# Patient Record
Sex: Male | Born: 1945 | Race: White | Hispanic: No | Marital: Married | Smoking: Never smoker
Health system: Southern US, Community
[De-identification: ages and names within clinical notes are randomized; demographics above are authoritative.]

## PROBLEM LIST (undated history)

## (undated) DIAGNOSIS — G8929 Other chronic pain: Secondary | ICD-10-CM

## (undated) DIAGNOSIS — F1010 Alcohol abuse, uncomplicated: Secondary | ICD-10-CM

## (undated) DIAGNOSIS — F329 Major depressive disorder, single episode, unspecified: Secondary | ICD-10-CM

## (undated) DIAGNOSIS — N183 Chronic kidney disease, stage 3 unspecified: Secondary | ICD-10-CM

## (undated) DIAGNOSIS — I5032 Chronic diastolic (congestive) heart failure: Secondary | ICD-10-CM

## (undated) DIAGNOSIS — E669 Obesity, unspecified: Secondary | ICD-10-CM

## (undated) DIAGNOSIS — I2510 Atherosclerotic heart disease of native coronary artery without angina pectoris: Secondary | ICD-10-CM

## (undated) DIAGNOSIS — L97509 Non-pressure chronic ulcer of other part of unspecified foot with unspecified severity: Secondary | ICD-10-CM

## (undated) DIAGNOSIS — E785 Hyperlipidemia, unspecified: Secondary | ICD-10-CM

## (undated) DIAGNOSIS — G629 Polyneuropathy, unspecified: Secondary | ICD-10-CM

## (undated) DIAGNOSIS — I10 Essential (primary) hypertension: Secondary | ICD-10-CM

## (undated) DIAGNOSIS — I4510 Unspecified right bundle-branch block: Secondary | ICD-10-CM

## (undated) DIAGNOSIS — K219 Gastro-esophageal reflux disease without esophagitis: Secondary | ICD-10-CM

## (undated) DIAGNOSIS — G4733 Obstructive sleep apnea (adult) (pediatric): Secondary | ICD-10-CM

## (undated) DIAGNOSIS — M1990 Unspecified osteoarthritis, unspecified site: Secondary | ICD-10-CM

## (undated) DIAGNOSIS — J45909 Unspecified asthma, uncomplicated: Secondary | ICD-10-CM

## (undated) HISTORY — DX: Unspecified right bundle-branch block: I45.10

## (undated) HISTORY — DX: Major depressive disorder, single episode, unspecified: F32.9

## (undated) HISTORY — DX: Other chronic pain: G89.29

## (undated) HISTORY — DX: Chronic diastolic (congestive) heart failure: I50.32

## (undated) HISTORY — DX: Unspecified osteoarthritis, unspecified site: M19.90

## (undated) HISTORY — DX: Obesity, unspecified: E66.9

## (undated) HISTORY — PX: CORONARY STENT PLACEMENT: SHX1402

## (undated) HISTORY — DX: Chronic kidney disease, stage 3 (moderate): N18.3

## (undated) HISTORY — DX: Alcohol abuse, uncomplicated: F10.10

## (undated) HISTORY — DX: Chronic kidney disease, stage 3 unspecified: N18.30

## (undated) HISTORY — DX: Gastro-esophageal reflux disease without esophagitis: K21.9

## (undated) HISTORY — DX: Hyperlipidemia, unspecified: E78.5

## (undated) HISTORY — DX: Polyneuropathy, unspecified: G62.9

## (undated) HISTORY — DX: Obstructive sleep apnea (adult) (pediatric): G47.33

## (undated) HISTORY — PX: REPLACEMENT TOTAL KNEE: SUR1224

## (undated) HISTORY — DX: Essential (primary) hypertension: I10

## (undated) HISTORY — DX: Non-pressure chronic ulcer of other part of unspecified foot with unspecified severity: L97.509

## (undated) HISTORY — DX: Unspecified asthma, uncomplicated: J45.909

---

## 2001-11-15 ENCOUNTER — Encounter: Payer: Self-pay | Admitting: Orthopedic Surgery

## 2001-11-21 ENCOUNTER — Encounter (INDEPENDENT_AMBULATORY_CARE_PROVIDER_SITE_OTHER): Payer: Self-pay | Admitting: Specialist

## 2001-11-21 ENCOUNTER — Ambulatory Visit (HOSPITAL_COMMUNITY): Admission: RE | Admit: 2001-11-21 | Discharge: 2001-11-21 | Payer: Self-pay | Admitting: Orthopedic Surgery

## 2004-05-02 ENCOUNTER — Encounter: Admission: RE | Admit: 2004-05-02 | Discharge: 2004-05-02 | Payer: Self-pay | Admitting: Orthopedic Surgery

## 2004-11-07 ENCOUNTER — Encounter: Admission: RE | Admit: 2004-11-07 | Discharge: 2004-11-07 | Payer: Self-pay | Admitting: Neurology

## 2005-05-11 ENCOUNTER — Encounter: Admission: RE | Admit: 2005-05-11 | Discharge: 2005-05-11 | Payer: Self-pay | Admitting: Neurology

## 2008-12-25 ENCOUNTER — Emergency Department (HOSPITAL_COMMUNITY): Admission: EM | Admit: 2008-12-25 | Discharge: 2008-12-25 | Payer: Self-pay | Admitting: Emergency Medicine

## 2009-04-12 ENCOUNTER — Inpatient Hospital Stay (HOSPITAL_COMMUNITY): Admission: EM | Admit: 2009-04-12 | Discharge: 2009-04-16 | Payer: Self-pay | Admitting: Cardiology

## 2011-01-27 ENCOUNTER — Other Ambulatory Visit (HOSPITAL_COMMUNITY): Payer: Medicare Other

## 2011-02-05 ENCOUNTER — Inpatient Hospital Stay (HOSPITAL_COMMUNITY): Admission: RE | Admit: 2011-02-05 | Payer: Medicare Other | Source: Ambulatory Visit | Admitting: Orthopedic Surgery

## 2011-03-10 LAB — BASIC METABOLIC PANEL
BUN: 9 mg/dL (ref 6–23)
CO2: 28 mEq/L (ref 19–32)
CO2: 29 mEq/L (ref 19–32)
Chloride: 100 mEq/L (ref 96–112)
Creatinine, Ser: 0.8 mg/dL (ref 0.4–1.5)
GFR calc non Af Amer: >60 mL/min (ref >60)
Glucose, Bld: 106 mg/dL — ABNORMAL HIGH (ref 70–99)
Potassium: 3.3 mEq/L — ABNORMAL LOW (ref 3.5–5.1)

## 2011-03-10 LAB — POCT I-STAT, CHEM 8
Glucose, Bld: 128 mg/dL — ABNORMAL HIGH (ref 70–99)
HCT: 41 % (ref 39.0–52.0)
Hemoglobin: 13.9 g/dL (ref 13.0–17.0)
Potassium: 4 mEq/L (ref 3.5–5.1)
Sodium: 138 mEq/L (ref 135–145)
TCO2: 27 mmol/L (ref 0–100)

## 2011-03-10 LAB — CBC
Hemoglobin: 12.9 g/dL — ABNORMAL LOW (ref 13.0–17.0)
RBC: 4.02 MIL/uL — ABNORMAL LOW (ref 4.22–5.81)
WBC: 7.6 10*3/uL (ref 4.0–10.5)

## 2011-03-10 LAB — CARDIAC PANEL(CRET KIN+CKTOT+MB+TROPI)
CK, MB: 91.5 ng/mL — ABNORMAL HIGH (ref 0.3–4.0)
Relative Index: 6.1 — ABNORMAL HIGH (ref 0.0–2.5)
Relative Index: 14.2 — ABNORMAL HIGH (ref 0.0–2.5)
Total CK: 246 U/L — ABNORMAL HIGH (ref 7–232)
Total CK: 645 U/L — ABNORMAL HIGH (ref 7–232)
Troponin I: 19.01 ng/mL (ref 0.00–0.06)

## 2011-03-10 LAB — POCT I-STAT 3, ART BLOOD GAS (G3+)
TCO2: 29 mmol/L (ref 0–100)
pCO2 arterial: 53.3 mmHg — ABNORMAL HIGH (ref 35.0–45.0)

## 2011-03-10 LAB — LIPID PANEL
Cholesterol: 144 mg/dL (ref 0–200)
HDL: 38 mg/dL — ABNORMAL LOW (ref >39)
Triglycerides: 161 mg/dL — ABNORMAL HIGH (ref <150)
VLDL: 32 mg/dL (ref 0–40)

## 2011-03-16 LAB — DIFFERENTIAL
Eosinophils Relative: 2 % (ref 0–5)
Lymphocytes Relative: 19 % (ref 12–46)
Lymphs Abs: 1.7 10*3/uL (ref 0.7–4.0)
Monocytes Absolute: 0.8 10*3/uL (ref 0.1–1.0)
Monocytes Relative: 9 % (ref 3–12)
Neutrophils Relative %: 70 % (ref 43–77)

## 2011-03-16 LAB — CBC
Platelets: 300 10*3/uL (ref 150–400)
RBC: 4.91 MIL/uL (ref 4.22–5.81)
RDW: 14.2 % (ref 11.5–15.5)

## 2011-03-16 LAB — POCT I-STAT, CHEM 8: Creatinine, Ser: 0.9 mg/dL (ref 0.4–1.5)

## 2011-03-16 LAB — POCT CARDIAC MARKERS
CKMB, poc: 1.2 ng/mL (ref 1.0–8.0)
Myoglobin, poc: 77.8 ng/mL (ref 12–200)

## 2011-03-26 ENCOUNTER — Other Ambulatory Visit: Payer: Self-pay | Admitting: Anesthesiology

## 2011-03-26 ENCOUNTER — Other Ambulatory Visit: Payer: Self-pay | Admitting: Orthopedic Surgery

## 2011-03-26 ENCOUNTER — Encounter (HOSPITAL_COMMUNITY): Payer: Medicare Other | Attending: Orthopedic Surgery

## 2011-03-26 DIAGNOSIS — I10 Essential (primary) hypertension: Secondary | ICD-10-CM | POA: Insufficient documentation

## 2011-03-26 DIAGNOSIS — M1710 Unilateral primary osteoarthritis, unspecified knee: Secondary | ICD-10-CM | POA: Insufficient documentation

## 2011-03-26 DIAGNOSIS — K219 Gastro-esophageal reflux disease without esophagitis: Secondary | ICD-10-CM | POA: Insufficient documentation

## 2011-03-26 DIAGNOSIS — E7800 Pure hypercholesterolemia, unspecified: Secondary | ICD-10-CM | POA: Insufficient documentation

## 2011-03-26 DIAGNOSIS — Z79899 Other long term (current) drug therapy: Secondary | ICD-10-CM | POA: Insufficient documentation

## 2011-03-26 DIAGNOSIS — Z01812 Encounter for preprocedural laboratory examination: Secondary | ICD-10-CM | POA: Insufficient documentation

## 2011-03-26 LAB — BASIC METABOLIC PANEL
BUN: 11 mg/dL (ref 6–23)
Calcium: 8.8 mg/dL (ref 8.4–10.5)
Chloride: 102 mEq/L (ref 96–112)
Creatinine, Ser: 0.67 mg/dL (ref 0.4–1.5)
GFR calc Af Amer: >60 mL/min (ref >60)
GFR calc non Af Amer: >60 mL/min (ref >60)
Glucose, Bld: 91 mg/dL (ref 70–99)
Potassium: 4.2 mEq/L (ref 3.5–5.1)

## 2011-03-26 LAB — SURGICAL PCR SCREEN: Staphylococcus aureus: POSITIVE — AB

## 2011-03-26 LAB — CBC
MCHC: 32.4 g/dL (ref 30.0–36.0)
Platelets: 206 10*3/uL (ref 150–400)
RBC: 3.7 MIL/uL — ABNORMAL LOW (ref 4.22–5.81)
WBC: 5.1 10*3/uL (ref 4.0–10.5)

## 2011-03-26 LAB — PROTIME-INR: INR: 0.99 (ref 0.00–1.49)

## 2011-03-26 LAB — APTT: aPTT: 32 seconds (ref 24–37)

## 2011-04-02 NOTE — H&P (Signed)
  NAME:  Keith Bernard, Keith Bernard NO.:  [id number]  MEDICAL RECORD NO.:  [id number]           PATIENT TYPE:  LOCATION:                                 FACILITY:  PHYSICIAN:  Marlowe Kays, M.D.  DATE OF BIRTH:  [birth year]  DATE OF ADMISSION:  04/03/2011 DATE OF DISCHARGE:                             HISTORY & PHYSICAL   CHIEF COMPLAINT:  "Pain in my left knee."  PRESENT ILLNESS:  This 65 year old white male has been seen by [place] with continuing problems concerning his left knee.  He has failed conservative care including cortisone injection, viscosupplementation. In the distant past, he has successfully undergone right total knee replacement arthroplasty.  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  He has also suffered some weight gain secondary to his level of inactivity.  After much discussion including the risks and benefits of surgery, it was decided to go ahead with total knee replacement arthroplasty of the left knee.  Dr. Simonne Come has discussed with him the  DICTATION ENDED AT THIS POINT.     [name].   ______________________________ Marlowe Kays, M.D.    DLU/MEDQ  D:  03/26/2011  T:  03/26/2011  Job:  [id number]  Electronically Signed by Marlowe Kays M.D. on 04/02/2011 12:38:23 PM

## 2011-04-03 ENCOUNTER — Inpatient Hospital Stay (HOSPITAL_COMMUNITY): Payer: Medicare Other

## 2011-04-03 ENCOUNTER — Inpatient Hospital Stay (HOSPITAL_COMMUNITY)
Admission: RE | Admit: 2011-04-03 | Discharge: 2011-04-08 | DRG: 470 | Disposition: A | Discharge status: Skilled Nursing Facility | Payer: Medicare Other | Source: Ambulatory Visit | Attending: Orthopedic Surgery | Admitting: Orthopedic Surgery

## 2011-04-03 DIAGNOSIS — I10 Essential (primary) hypertension: Secondary | ICD-10-CM | POA: Diagnosis present

## 2011-04-03 DIAGNOSIS — M1710 Unilateral primary osteoarthritis, unspecified knee: Principal | ICD-10-CM | POA: Diagnosis present

## 2011-04-03 DIAGNOSIS — Z96659 Presence of unspecified artificial knee joint: Secondary | ICD-10-CM

## 2011-04-03 DIAGNOSIS — E871 Hypo-osmolality and hyponatremia: Secondary | ICD-10-CM | POA: Diagnosis not present

## 2011-04-03 DIAGNOSIS — F1010 Alcohol abuse, uncomplicated: Secondary | ICD-10-CM | POA: Diagnosis present

## 2011-04-03 DIAGNOSIS — E7800 Pure hypercholesterolemia, unspecified: Secondary | ICD-10-CM | POA: Diagnosis present

## 2011-04-03 DIAGNOSIS — Z531 Procedure and treatment not carried out because of patient's decision for reasons of belief and group pressure: Secondary | ICD-10-CM

## 2011-04-03 DIAGNOSIS — D62 Acute posthemorrhagic anemia: Secondary | ICD-10-CM | POA: Diagnosis not present

## 2011-04-03 DIAGNOSIS — Z79899 Other long term (current) drug therapy: Secondary | ICD-10-CM

## 2011-04-03 DIAGNOSIS — I2510 Atherosclerotic heart disease of native coronary artery without angina pectoris: Secondary | ICD-10-CM | POA: Diagnosis present

## 2011-04-03 DIAGNOSIS — Z01812 Encounter for preprocedural laboratory examination: Secondary | ICD-10-CM

## 2011-04-03 DIAGNOSIS — K219 Gastro-esophageal reflux disease without esophagitis: Secondary | ICD-10-CM | POA: Diagnosis present

## 2011-04-03 DIAGNOSIS — G4733 Obstructive sleep apnea (adult) (pediatric): Secondary | ICD-10-CM | POA: Diagnosis present

## 2011-04-04 LAB — PROTIME-INR: Prothrombin Time: 14.1 seconds (ref 11.6–15.2)

## 2011-04-04 LAB — BASIC METABOLIC PANEL
Calcium: 7.8 mg/dL — ABNORMAL LOW (ref 8.4–10.5)
Chloride: 96 mEq/L (ref 96–112)
GFR calc Af Amer: >60 mL/min (ref >60)
Sodium: 128 mEq/L — ABNORMAL LOW (ref 135–145)

## 2011-04-04 LAB — CBC
HCT: 26.2 % — ABNORMAL LOW (ref 39.0–52.0)
MCV: 100 fL (ref 78.0–100.0)
Platelets: 169 10*3/uL (ref 150–400)
RDW: 12.3 % (ref 11.5–15.5)

## 2011-04-05 LAB — BASIC METABOLIC PANEL
CO2: 26 mEq/L (ref 19–32)
Calcium: 8.4 mg/dL (ref 8.4–10.5)
Chloride: 96 mEq/L (ref 96–112)
GFR calc Af Amer: >60 mL/min (ref >60)
Potassium: 4 mEq/L (ref 3.5–5.1)
Sodium: 128 mEq/L — ABNORMAL LOW (ref 135–145)

## 2011-04-05 LAB — CBC
HCT: 20.9 % — ABNORMAL LOW (ref 39.0–52.0)
Hemoglobin: 7 g/dL — ABNORMAL LOW (ref 13.0–17.0)
MCV: 98.1 fL (ref 78.0–100.0)
RDW: 12.3 % (ref 11.5–15.5)

## 2011-04-06 LAB — PROTIME-INR: Prothrombin Time: 17.4 seconds — ABNORMAL HIGH (ref 11.6–15.2)

## 2011-04-06 LAB — IRON AND TIBC
Iron: 10 ug/dL — ABNORMAL LOW (ref 42–135)
Saturation Ratios: 5 % — ABNORMAL LOW (ref 20–55)
UIBC: 182 ug/dL

## 2011-04-06 LAB — CBC
HCT: 18.1 % — ABNORMAL LOW (ref 39.0–52.0)
Hemoglobin: 6.1 g/dL — CL (ref 13.0–17.0)
MCH: 32.8 pg (ref 26.0–34.0)
RBC: 1.86 MIL/uL — ABNORMAL LOW (ref 4.22–5.81)

## 2011-04-06 LAB — FERRITIN: Ferritin: 138 ng/mL (ref 22–322)

## 2011-04-06 LAB — BASIC METABOLIC PANEL
Chloride: 95 mEq/L — ABNORMAL LOW (ref 96–112)
GFR calc non Af Amer: >60 mL/min (ref >60)
Glucose, Bld: 100 mg/dL — ABNORMAL HIGH (ref 70–99)
Potassium: 4.1 mEq/L (ref 3.5–5.1)
Sodium: 127 mEq/L — ABNORMAL LOW (ref 135–145)

## 2011-04-07 LAB — CBC
HCT: 19.1 % — ABNORMAL LOW (ref 39.0–52.0)
MCHC: 33 g/dL (ref 30.0–36.0)
MCV: 99.5 fL (ref 78.0–100.0)
RDW: 12.4 % (ref 11.5–15.5)

## 2011-04-08 LAB — PROTIME-INR
INR: 2.39 — ABNORMAL HIGH (ref 0.00–1.49)
Prothrombin Time: 26.2 seconds — ABNORMAL HIGH (ref 11.6–15.2)

## 2011-04-14 NOTE — H&P (Signed)
NAMECOLETON, WOON              ACCOUNT NO.:  [id number]   MEDICAL RECORD NO.:  [id number]          PATIENT TYPE:  INP   LOCATION:  2905                         FACILITY:  [facility]   PHYSICIAN:  Armanda Magic, M.D.     DATE OF BIRTH:  August 02, 1946   DATE OF ADMISSION:  04/12/2009  DATE OF DISCHARGE:                              HISTORY & PHYSICAL   CHIEF COMPLAINT:  Chest pain.   HISTORY OF PRESENT ILLNESS:  Mr. Sanders is a 65 year old male patient  with a history of coronary artery disease, nocturnal chest pain at 3  a.m. accompanied with some dyspnea.  He tried a Vicodin to relieve the  pain and eventually had a call EMS.  The patient denies nausea,  vomiting, and he did have shortness of breath.   PAST MEDICAL HISTORY:  Told he had an enlarged heart.  He has partial  obstructive sleep apnea on CPAP.  He has a history of hypertension,  hiatal hernia, DJD of the back.  He has had a knee replaced in the past.   SOCIAL HISTORY:  Rare alcohol use.  No illicit drug use.  No tobacco  use.   FAMILY HISTORY:  Mom smoked.  She had CHF.  Dad had no coronary artery  disease.   REVIEW OF SYSTEMS:  Otherwise normal.   ALLERGIES:  No known drug allergies.   MEDICATIONS:  1. Nexium 40 mg a day.  2. Lexapro 20 mg a day.  3. Opana ER 5 mg q.12 h.  4. Lorazepam 1 mg t.i.d.  5. Bupropion 300 mg a day.  6. Diclofenac 75 mg b.i.d.  7. Hydrochlorothiazide 25 mg a day.  8. Fenofibrate 160 mg a day.  9. Cardizem CD 300 mg a day.  10.Ambien CR 12.5 mg nightly.   PHYSICAL EXAMINATION:  VITAL SIGNS:  Pulse 111, respirations 17, blood  pressure 166/96, O2 saturations 98% on room air.  HEENT:  Grossly normal.  No carotid or subclavian bruits.  No JVD.  No  thyromegaly.  HEENT:  Sclerae clear.  Conjunctivae normal.  Nares without drainage.  CHEST:  Clear to auscultation bilaterally.  No wheezing or rhonchi.  HEART:  Regular rate and rhythm.  No rubs, murmur, or ectopy.  ABDOMEN:  Obese, good  bowel sounds.  Nontender, nondistended.  No  masses, no bruits.  EXTREMITY:  No peripheral edema.  SKIN:  Warm and dry.  NEUROLOGIC:  Cranial nerves II through XII grossly intact.  PSYCHIATRIC:  Normal mood and affect.   LABS:  Hemoglobin 14.8, hematocrit 44.5, white count 9.1, platelets 300.  Sodium 138, potassium 3.8, BUN 14, creatinine 0.9.  Point of care  markers negative x1.   EKG shows normal sinus rhythm with a right bundle-branch block, ST  segment elevation in the anterior lateral leads.  T-wave inversion  inferolaterally.   ASSESSMENT AND PLAN:  1. Acute anterolateral/septal myocardial infarction.  2. Hypertension.  3. Obstructive sleep apnea.  4. Hiatal hernia.   The patient has been seen and examined by Dr. Mayford Knife.  We will take to  the cath lab urgently under the care  of Dr. Corliss Marcus.      Guy Franco, P.A.      Armanda Magic, M.D.  Electronically Signed    LB/MEDQ  D:  04/12/2009  T:  04/12/2009  Job:  [id number]

## 2011-04-14 NOTE — Discharge Summary (Signed)
NAME:  Keith Bernard, Keith Bernard              ACCOUNT NO.:  [id number]   MEDICAL RECORD NO.:  [id number]          PATIENT TYPE:  INP   LOCATION:  2020                         FACILITY:  [facility]   PHYSICIAN:  Armanda Magic, M.D.     DATE OF BIRTH:  12-15-1945   DATE OF ADMISSION:  04/12/2009  DATE OF DISCHARGE:  04/16/2009                               DISCHARGE SUMMARY   DISCHARGE DIAGNOSES:  1. Coronary artery disease, status post ST-elevation myocardial      infarction, anterior.  Status post percutaneous coronary      intervention of the left anterior descending, Apr 12, 2009.  2. Ischemic cardiomyopathy, perhaps mixed with alcohol use, ejection      fraction 35%.  3. Alcohol abuse.  4. Hypokalemia, resolved.  5. Gastroesophageal reflux disease.  6. Obstructive sleep apnea on continuous positive airway pressure.  7. Hypertension.  8. Degenerative joint disease.  9. Chronic pain.  10.Depression.   HOSPITAL COURSE:  Keith Bernard is a 65 year old male patient with a  history of coronary artery disease.  He developed nocturnal chest pain  at 3 a.m. accompanied with some shortness of breath.  He tried Vicodin  to relieve the pain and essentially he had to call EMS.  Upon arrival to  the emergency room, his EKG showed a right bundle-branch block.  There  was ST-segment elevation in the anterior leads with T-wave inversion  inferolaterally.  He was brought to the catheterization suite emergently  and was found to have EF of 35% with apical dyskinesis, anterior  dyskinesis.  He underwent a successful PCI of the mid LAD using a Vision  stent.  Aspirin, Plavix, and prasurgrel were recommended for a minimum  of 30 days along with ACE inhibitor and beta-blocker.   The patient remained in the hospital over the next several days and did  well.  He participated with cardiac rehabilitation.  He is not a smoker,  but he is an alcohol drinker and we have counseled him upon cessation of  this.   LABORATORY  STUDIES:  During the patient's hospital stay:  Sodium 135,  potassium 3.8, BUN 9, and creatinine 0.8.  His maximum troponin and  19.01, total cholesterol 144, triglycerides 161, HDL 38, and LDL 74.   Chest x-ray:  No evidence of acute cardiopulmonary process.   DISCHARGE MEDICATIONS:  1. Nexium 40 mg a day.  2. Lexapro 20 mg a day.  3. Opana ER 5 mg q.12 h. p.r.n. pain.  4. Lorazepam 1 mg 3 times a day as needed.  5. Bupropion 300 mg a day.  6. Diclofenac 75 mg twice a day as needed.  7. Fenofibrate 160 mg daily.  8. Ambien CR 12.5 mg at bedtime p.r.n.  9. Enteric-coated aspirin 325 mg a day.  10.Lipitor 40 mg a day.  11.Prasurgrel 10 mg a day.  12.Altace 5 mg a day.  13.Coreg 6.25 mg twice a day.   He is to stop taking Cardizem.   He was discharged home in stable, but improved condition.  He is to  remain on low-sodium, heart-healthy diet.  Increase activity  slowly as  per cardiac rehabilitation.  No lifting over 10 pounds for 1 week.  No  driving for 2 days.  Clean cath site gently with soap and water.  No  scrubbing.  Follow up with Dr. Mayford Knife on April 30, 2009, at 10:30 a.m.      Guy Franco, P.A.      Armanda Magic, M.D.  Electronically Signed    LB/MEDQ  D:  04/16/2009  T:  04/17/2009  Job:  [id number]

## 2011-04-15 NOTE — Discharge Summary (Signed)
NAME:  Keith Bernard, Keith Bernard              ACCOUNT NO.:  [id number]  MEDICAL RECORD NO.:  [id number]           PATIENT TYPE:  I  LOCATION:  1605                         FACILITY:  [facility]  PHYSICIAN:  Marlowe Kays, M.D.  DATE OF BIRTH:  [birth year]  DATE OF ADMISSION:  04/03/2011 DATE OF DISCHARGE:  04/08/2011                        DISCHARGE SUMMARY - REFERRING   ADMITTING DIAGNOSES: 1. History of right total knee replacement arthroplasty, end-stage     osteoarthritis of the left knee. 2. Coronary artery disease. 3. Hypocholesterolemia. 4. Hypertension. 5. EtOH abuse.  DISCHARGE DIAGNOSES: 1. History of right total knee replacement arthroplasty, end-stage     osteoarthritis of the left knee. 2. Coronary artery disease. 3. Hypocholesterolemia. 4. Hypertension. 5. EtOH abuse. 6. Expected postoperative acute anemia. 7. Hyponatremia.  OPERATION:  On Apr 03, 2011, the patient underwent Osteonics total knee replacement arthroplasty of the left knee, Alvera Novel assisted.  BRIEF HISTORY:  This 65 year old white male seen by [place] for progressive problem concerning his left knee.  All 3 compartments were involved, near bone-on-bone deformity.  Conservative care including Viscoat supplementation as well as steroid injections have not helped him.  He continues left knee pain and it is markedly interfering with his day-to- day activities.  The risks and benefits of surgery were discussed with him and his wife and was decided to go ahead with left total knee replacement arthroplasty.  They are Jehovah witnesses and have adamantly requested no blood products, which we will comply.  COURSE IN HOSPITAL:  The patient tolerated the surgical procedure quite well.  He was placed in the total knee protocol and did very well postoperatively.  He had considerable amount of blood loss during surgery.  No active arterial bleeding, but gentle ooze from the tissue. This was controlled prior to  closure of the wound.  Postoperatively, he had expected anemia.  His hemoglobin was 8.6, hematocrit 26.2.  Hemoglobin dropped to 6.1 with hematocrit of 18.1.  We gave the patient iron dextran, per pharmacy, 1500 mL.  Also, on the evening of May 7, Aranesp was given.  This brought his hemoglobin up to 6.3.  I learned from his wife that the patient had a similar drop in hemoglobin even to the level that she mentioned of 6.1 and he eventually regained normal blood levels for him.  He did have expected dizziness and little problems in getting about.  We had him on ferrous sulfate 3 times a day as well.  He was hoping that he would go home with home physical therapy.  However, due to his weakness and continuing need for rehabilitation, preferably daily supervise, it was decided he could be transferred to rehabilitation facility.  Arrangements were made for that.  It was also noted postoperatively that he had hyponatremia.  We restricted his intake of just water.  We offered fruit juices, colas and Gatorade, etc., for thirst.  As far as his surgery was concerned, right knee wound had a considerable amount of bleeding in the first postop day and some in the second postop day but today, it was dry with minimal drainage, whatsoever. Neurovascular, he is intact in his  left lower extremity.  We felt that he would be able to continue with his total knee protocol at facility.  He was placed on Lovenox postoperatively, transitioning to a Coumadin protocol to keep the INR between 2 and 3.  This is for the prevention of DVT in the lower extremities.  Today, all questions were encouraged and answered from the patient as well as his wife.  MEDICATIONS AT DISCHARGE: 1. Librium 50 mg by mouth q.4 h. p.r.n. 2. Docusate sodium 100 mg by mouth twice daily. 3. Enema of choice for constipation. 4. Ferrous sulfate 325 mg by mouth 3 times a day. 5. Fleet Enema p.r.n. constipation. 6. Hydrocodone 8  tablets 5/325 one to two q.4 h. p.r.n. 7. Maalox q.4 h. p.r.n. 8. Methocarbamol 500 mg q.6h. p.r.n. muscle spasm. 9. Senokot 1 tablet at bedtime as needed. 10.Thiamine 100 mg by mouth daily. 11.Coumadin protocol per pharmacy, again to keep INR between 1 and 2. 12.Ambien daily at bedtime. 13.AndroGel testosterone cream, 2 applications per at home. 14.Bupropion XL 150 mg tablets, 3 tablets daily. 15.Carvedilol 6.25 mg twice a with meals. 16.Citalopram 40 mg tablet daily. 17.Crestor 10 mg tablet daily. 18.Prasugrel 1 tablet daily. 19.Lorazepam 1 mg tab daily as needed. 20.Nexium 1 capsule daily. 21.Nitroglycerin p.r.n. 22.Oxycodone 15 mg 1 tablet daily as needed. 23.Opana ER 1 tablet every 12 hours. 24.Ramipril 5 mg capsule by mouth daily.  PLAN:  He is not to have the fish oil, aspirin or the ferrous sulfate he did take at home.  He is to continue with regular diet, again would restrict free water. To use primarily juices for strength and for thirst.  Would like to see him back at our office in 2 weeks after the date of surgery.  Dry dressing to the knee whenever necessary.  He may weightbear as tolerated on the left lower extremity.  It was noted by the therapist as well as the patient, he had a minor foot-drop on the right.  Hopefully, this is will clear with his activity. Neurovascularly intact in the left lower extremity.  Prescriptions were written for chlordiazepoxide 25 mg caps q.4 h. p.r.n., thiamine, hydrocodone, Robaxin, and Coumadin.  If you have any questions and to make the appointment for this patient, please call Dr. Leim Fabry office at [phone].  Any medical questions should be directed towards Dr. Mayford Knife and/or Dr. Alver Fisher.     [name].   ______________________________ Marlowe Kays, M.D.    DLU/MEDQ  D:  04/07/2011  T:  04/07/2011  Job:  [id number]  cc:   Armanda Magic, M.D. Fax: [phone]  [name] Fax: [phone]  Dr.  Leim Fabry office [facility]  Electronically Signed by Marlowe Kays M.D. on 04/15/2011 04:18:32 PM

## 2011-04-15 NOTE — H&P (Signed)
NAME:  Keith Bernard, Keith Bernard NO.:  [id number]  MEDICAL RECORD NO.:  [id number]           PATIENT TYPE:  LOCATION:                                 FACILITY:  PHYSICIAN:  Marlowe Kays, M.D.  DATE OF BIRTH:  27-Mar-1946  DATE OF ADMISSION:  04/03/2011 DATE OF DISCHARGE:                             HISTORY & PHYSICAL   CHIEF COMPLAINT:  "Pain in my left knee."  HISTORY OF PRESENT ILLNESS:  This is a 65 year old white male who has been seen by [place] for continued progressive problems concerning pain into his left knee.  X-rays have shown near bone-on-bone deformity in all 3 compartments, particularly on the medial compartments.  He has failed conservative care including viscosupplementation and steroid injections. He has had a considerable amount of weight gain due to his level of inactivities from his left knee pain.  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  After much discussion including the risks and benefits of surgery, he decided to ahead with total knee replacement arthroplasty on the left.  He successfully undergone right total knee in the past and is anxious to improve his lifestyle with this prosthesis.  He and his wife do practice Jehovah Witness religion and they adamantly refused any blood products.  We will comply.  The patient has been cleared preoperatively by Dr. Armanda Magic of [facility].  Dr. [name] of [facility] is his family physician.  CURRENT MEDICAL HISTORY:  Coronary atherosclerosis with coronary artery disease, essential hypertension, hypercholesterolemia.  Also EtOH abuse, GERD, degenerative joint disease, chronic pain and depression.  CURRENT MEDICATIONS: 1. Fish oil which he will stop prior to surgery. 2. Carvedilol 6.2 mg tabs 1 twice a day. 3. Ramipril 5 mg capsule once a day. 4. He is taking aspirin 81 mg daily, will stop prior to surgery. 5. Effient 10 mg  tab 1 daily. 6. Crestor 10 mg tabs once a day. 7. Oxymorphone (now taking OxyContin) 3 times a day. 8. Opana ER 10 mg tab daily. 9. Citalopram hydrobromide 40 mg tablets once a day. 10.Bupropion 150 mg 3 tablets a day. 11.Lorazepam 1 mg tab as needed. 12.Ambien 12.5 mg at bedtime. 13.325 mg of iron daily. 14.Nitroglycerin p.r.n. 15.Nexium 40 mg tabs 1 oral once a day.  ALLERGIES:  He denies any medical allergies; however, he did have a considerable amount of problems with his analgesics postop to his right knee.  However, he suspected it was the morphine at that time given intravenously.  PAST SURGERIES:  Include right total knee replacement arthroplasty.  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  FAMILY HISTORY:  Noncontributory.  REVIEW OF SYSTEMS:  CNS:  No seizures or paralysis, numbness, double vision.  RESPIRATORY:  No productive cough, no hemoptysis, shortness of breath, but the patient does have sleep apnea.  CARDIOVASCULAR:  No chest pain, no angina or orthopnea.  GASTROINTESTINAL:  No nausea, vomiting, melena, or bloody stool, but the patient does have dyspepsia but does not stay on  his medication regimen.  GENITOURINARY:  No discharge, dysuria, hematuria.  MUSCULOSKELETAL:  Primarily in present illness.  PHYSICAL EXAMINATION:  GENERAL:  Cooperative, friendly, somewhat obese 66 year old white male accompanied by his wife. VITAL SIGNS:  Blood pressure 128/78, pulse 76, respirations 12. HEENT: Normocephalic.  PERRLA intact.  EOMs intact.  Oropharynx is clear. CHEST:  Clear to auscultation.  No rhonchi or rales. HEART:  Regular rate and rhythm.  No murmurs are heard. Abdomen:  Soft, nontender, obese.  Liver and spleen not felt. GENITALIA AND RECTAL:  Not done pertinent to present illness. EXTREMITIES:  The patient has pain with range of motion of the left knee, small swelling, no  erythema.  ADMISSION DIAGNOSES: 1. End-stage osteoarthritis of left knee. 2. Coronary artery disease with stent. 3. Hypercholesterolemia. 4. Hypertension. 5. EtOH abuse.  PLAN:  The patient will undergo left total knee replacement arthroplasty.  Again, we will refrain from giving any blood products. We will need to put him on a Librium and vitamin protocol postoperatively to prevent the confusion that he had postoperatively when he had right total knee arthroplasty at an outside Hospital.  All this was discussed with Casimiro Needle and with his wife.     Keith Bernard.   ______________________________ Marlowe Kays, M.D.    DLU/MEDQ  D:  03/26/2011  T:  03/26/2011  Job:  [id number]  cc:   Armanda Magic, M.D. Fax: [phone]  [name] Fax: [phone]  Electronically Signed by Marlowe Kays M.D. on 04/15/2011 04:18:47 PM

## 2011-04-15 NOTE — Op Note (Signed)
NAME:  Keith Bernard, Keith Bernard              ACCOUNT NO.:  [id number]  MEDICAL RECORD NO.:  [id number]           PATIENT TYPE:  I  LOCATION:  0001                         FACILITY:  [facility]  PHYSICIAN:  Marlowe Kays, M.D.  DATE OF BIRTH:  May 31, 1946  DATE OF PROCEDURE:  04/03/2011 DATE OF DISCHARGE:                              OPERATIVE REPORT   PREOPERATIVE DIAGNOSIS:  Osteoarthritis, left knee.  POSTOPERATIVE DIAGNOSIS:  Osteoarthritis, left knee.  OPERATION:  Osteonics total knee replacement, left.  SURGEON:  Marlowe Kays, M.D.  ASSISTANTDruscilla Brownie. Idolina Primer, P.A.C.  ANESTHESIA:  General.  PLAN AND JUSTIFICATION FOR PROCEDURE:  He has had a total knee replacement on the right.  On the left, he had a medial shift of the femur and tibia as well as bone-on-bone involvement.  For this reason, I felt also that a supplementary stem on the tibial component was indicated.  DESCRIPTION OF PROCEDURE:  Prophylactic antibiotics, satisfied general anesthesia, pneumatic tourniquet, shear-foot and lateral hip stabilizer, left leg was prepped with DuraPrep from tourniquet to ankle and draped in sterile field.  Ioban employed.  Time-out performed.  Vertical midline incision with median parapatellar incision opened on the joint. The pes anserinus and medial collateral ligament were undermined off the proximal tibia and the patella mechanism was freed up, the patella everted and the knee flexed.  We then removed extensive osteophytes from around the femur and patella which was sized at a 28.  I then made a 5/16th inch drill hole in the distal femur followed by the canal finder and the axis aligner for a 5-degree valgus cut.  Because he had no flexion contracture, I elected to take off 10 mm.  We then used the cradle apparatus to size the distal femur at 11.  Scribe lines were placed on the distal femur and I then applied the distal femoral cutting jig and made anterior-posterior cuts and  posterior and anterior chamferings.  Moving to the tibia, I made a leveling cut and the initial sizing was for a size 11.  We made the initial intramedullary drill hole followed by step-cut drill canal finder and intramedullary rod to make a 90-degree cut.  Then I used the depressed lateral tibial plateau as the initial marker with a 4-mm cut.  We then placed a laminar spreader and removed remnants of bone and ligament and soft tissue from behind the femoral condyles.  I followed this with the jig to create both the patellar groove and the notchplasty.  After these 2 tests were performed, we went through a trial reduction and found that the knee joint was too tight and accordingly I replaced the tibial jig and took an additional 4 mm of bone from the proximal tibia and this allowed [place] to easily place a 10-mm spacer.  We then went through a trial reduction and used external aligning rod splitting bimalleolar distance to make scribe lines on the anterior tibia and also sized it and found that now a size 9 rather than 11 was the best fit.  While the knee was in extension, I went ahead and used the 10-mm recess cutting  jig to make a 10-mm recess cut followed by the guide for creating 3 fixation holes and then placed a trial 28 mm patella trimming bone around the perimeter. We then water picked the knee while the components were opened.  Just prior to this, however, we went ahead and placed the baseplate using the previous scribe lines on the tibia and I reamed for the tibial stem or keel with the aligning device up to a 9 cemented.  I then hand reamed to a 16 mm for the supplemental stem of 80 mm which I felt was indicated for stability purposes.  I then went through a trial reduction with the tibial component and stem and it fit nicely.  Again after opening the components, we water picked the knee, the tibial extension was placed on the final component, methacrylate was mixed and we then  glued in the individual components starting first with the tibial component placing glue down through the keel to the tip of the keel but not on the supplemental stem and impacting the component and removing excess methacrylate.  The same was then done for the femur and with the 10 mm spacer in place, we held the knee in extension while the patella was glued in using the patellar holding clamp.  When the methacrylate hardened, we checked and removed small amounts of methacrylate throughout the knee and went through another trial reduction and found that the 8 mm spacer was ideal.  Accordingly, after irrigating the wound well, we placed the final 10-mm posterior stabilized [place] component. The knee had excellent flexion, extension, and stability.  The patella was subluxing laterally, so I did a lateral release which allowed it to track normally.  I then injected soft tissues with 0.5% Marcaine with adrenaline and we released the tourniquet.  Initially there was no major bleeding.  Hemovac was placed and on closing the wound, there was a steady ooze without any one source noted.  The wound was closed in layers with the quadriceps tendon in 2 layers with #1 Vicryl and distally the synovium and capsule in 2 layers.  Subcutaneous tissue was closed with combination #1 and #2-0 Vicryl, staples in the skin. Betadine, Adaptic, dry sterile dressing were applied followed by a knee immobilizer.  He tolerated the procedure well, was taken to recovery room in satisfactory condition.  Estimated blood loss was perhaps 250 cc.  No blood replacement.          ______________________________ Marlowe Kays, M.D.     JA/MEDQ  D:  04/03/2011  T:  04/03/2011  Job:  [id number]  Electronically Signed by Marlowe Kays M.D. on 04/15/2011 04:18:41 PM

## 2011-04-17 NOTE — Op Note (Signed)
[facility]  Patient:    Keith Bernard, Keith Bernard Visit Number: [id number] MRN: [id number]          Service Type: DSU Location: DAY Attending Physician:  Marlowe Kays Page Dictated by:   Illene Labrador. Aplington, M.D. Proc. Date: 11/21/01 Admit Date:  11/21/2001                             Operative Report  PREOPERATIVE DIAGNOSES:  1. Torn medial meniscus.  2. Osteoarthritis.  3. Subcutaneous mass suprapatellar area, right knee.  POSTOPERATIVE DIAGNOSES:  1. Torn medial and lateral menisci.  2. Extensive osteoarthritis.  3. Subcutaneous sebaceous cyst suprapatellar area, right knee.  OPERATION PERFORMED:  1. Right knee arthroscopy with a) partial medial and lateral meniscectomy.  2. Debridement of medial femoral condyle and medial tibial plateau, lateral     femoral condyle and lateral tibial plateau.  3. Excision of suprapatellar plica.  4. Excision of subcutaneous sebaceous cyst suprapatellar area, right knee.  SURGEON:  Illene Labrador. Aplington, M.D.  ASSISTANT:  Nurse.  ANESTHESIA:  General.  PATHOLOGY AND JUSTIFICATION FOR PROCEDURE:  Painful right knee with known osteoarthritis but on MRI also has a complex tear of the medial meniscus and it was felt that he may have some improvement of his pain by taking care of the torn medial meniscus and might delay a knee replacement. Just prior to surgery, he also asked me to look at a subcutaneous mass in the suprapatellar area and asked me to excise it. See operative description below for additional pathology.  DESCRIPTION OF PROCEDURE:  Satisfactory general anesthesia, pneumatic tourniquet, thigh stabilizer, the right knee was prepped with Duraprep, draped in a sterile field. The suprapatellar mass was marked out, the transverse incision infiltrated with 0.5% Marcaine with adrenaline, my standard marking out of the knee and portals was also performed. Superior medial saline inflow. First through an  anterior medial portal, the lateral compartment of the knee joint was evaluated. He had some grade 3/4 chondromalacia of the lateral tibial plateau and lateral femoral condyle which I pictured and shaved down until smooth with a 3.5 shaver. He had moderately severe serrated tear of the inner mid third of the lateral meniscus which I also pictured and shaved down until smooth with a 3.5 shaver. Looking up in the lateral gutter and suprapatellar area, the patellar surface did not require anything done arthroscopically and really looked quite good but did have a large plica in the medial shelf area which I sectioned with scissors and removed with baskets. Pre and post films were taken. I then reversed portals and through an anterior lateral portal looked at the medial compartment of the knee joint. He had full-thickness wear over a good portion of the medial tibial plateau and grade 2-3 out of 4 of the medial femoral condyle. Both of these areas were debrided down. The major finding was the badly serrated tear involving most of the posterior two-thirds of the medial meniscus which was pictured and shaved down until smooth with a 3.5 shaver with final pictures being taken. The knee joint was then irrigated until clear and all fluid possible was removed. The two anterior portals were closed with 4-0 nylon. The 20 cc of 0.5% Marcaine with adrenaline and 4 mg of morphine were then instilled through the inflap rasp which was removed and this portal closed with 4-0 nylon as well. I then made a 3 cm transverse incision centered over  the mass which was subcutaneous and shelled out and had the characteristic appearance of a sebaceous cyst. The cyst itself measured 2 cm x 1 1/2 cm. This wound was closed with multiple interrupted 4-0 nylon mattress sutures. Betadine Adaptic dry sterile dressing were applied, tourniquet was released. The patient tolerated the procedure well and was taken to the recovery  room in satisfactory condition with no known complications. Dictated by:   Illene Labrador. Aplington, M.D. Attending Physician:  Joaquin Courts DD:  11/21/01 TD:  11/22/01 Job: [id number]

## 2011-11-06 ENCOUNTER — Emergency Department (HOSPITAL_COMMUNITY)
Admission: EM | Admit: 2011-11-06 | Discharge: 2011-11-06 | Disposition: A | Discharge status: Home / Self Care | Payer: Medicare Other | Attending: Emergency Medicine | Admitting: Emergency Medicine

## 2011-11-06 ENCOUNTER — Encounter: Payer: Self-pay | Admitting: Emergency Medicine

## 2011-11-06 DIAGNOSIS — R040 Epistaxis: Secondary | ICD-10-CM | POA: Insufficient documentation

## 2011-11-06 DIAGNOSIS — C000 Malignant neoplasm of external upper lip: Secondary | ICD-10-CM | POA: Insufficient documentation

## 2011-11-06 HISTORY — DX: Atherosclerotic heart disease of native coronary artery without angina pectoris: I25.10

## 2011-11-06 LAB — CBC
Hemoglobin: 12.8 g/dL — ABNORMAL LOW (ref 13.0–17.0)
RBC: 4.14 MIL/uL — ABNORMAL LOW (ref 4.22–5.81)
WBC: 5.7 10*3/uL (ref 4.0–10.5)

## 2011-11-06 LAB — DIFFERENTIAL
Basophils Relative: 1 % (ref 0–1)
Lymphs Abs: 2.2 10*3/uL (ref 0.7–4.0)
Monocytes Relative: 9 % (ref 3–12)
Neutro Abs: 2.5 10*3/uL (ref 1.7–7.7)
Neutrophils Relative %: 45 % (ref 43–77)

## 2011-11-06 LAB — PROTIME-INR: INR: 0.98 (ref 0.00–1.49)

## 2011-11-06 MED ORDER — OXYMETAZOLINE HCL 0.05 % NA SOLN: 1.0000 | Freq: Once | NASAL | Status: DC

## 2011-11-06 MED ORDER — SILVER NITRATE-POT NITRATE 75-25 % EX MISC
1.0000 | CUTANEOUS | Status: AC
  Administered 2011-11-06: 1 via TOPICAL
  Filled 2011-11-06: qty 1

## 2011-11-06 NOTE — ED Provider Notes (Signed)
History     CSN: [id number] Arrival date & time: 11/06/2011  2:54 AM   First MD Initiated Contact with Patient 11/06/11 0310      Chief Complaint  Patient presents with  . Epistaxis    nosebleed since 2000 hour    (Consider location/radiation/quality/duration/timing/severity/associated sxs/prior treatment) HPI Comments: Patient presents with left-sided nosebleed onset around 8 PM. He was picking his nose and the bleeding started after that. He is on aspirin and effient but not coumadin.  Is not have major nosebleeds in the past but did have something cauterized several years ago. He denies any chest pain, shortness of breath, nausea, vomiting. He denies any dizziness, lightheadedness, fevers or chills.  The history is provided by the patient.    Past Medical History  Diagnosis Date  . Coronary artery disease     Past Surgical History  Procedure Date  . Coronary stent placement   . Replacement total knee     left knee    History reviewed. No pertinent family history.  History  Substance Use Topics  . Smoking status: Never Smoker   . Smokeless tobacco: Not on file  . Alcohol Use: Yes     3oz a day      Review of Systems  Constitutional: Negative for activity change.  HENT: Negative for congestion and rhinorrhea.   Respiratory: Negative for cough, chest tightness and shortness of breath.   Cardiovascular: Negative for chest pain.  Gastrointestinal: Negative for nausea, vomiting and abdominal pain.  Genitourinary: Negative for dysuria and hematuria.  Musculoskeletal: Negative for back pain.  Skin: Negative for rash.  Neurological: Negative for dizziness, weakness and headaches.    Allergies  Review of patient's allergies indicates no known allergies.  Home Medications   Current Outpatient Rx  Name Route Sig Dispense Refill  . ASPIRIN 81 MG PO TABS Oral Take 81 mg by mouth daily.      . BUPROPION HCL ER (XL) 300 MG PO TB24 Oral Take 450 mg by mouth daily.       Marland Kitchen CARVEDILOL 6.25 MG PO TABS Oral Take 6.25 mg by mouth 2 (two) times daily with a meal.      . CITALOPRAM HYDROBROMIDE 40 MG PO TABS Oral Take 40 mg by mouth daily.      Marland Kitchen DICLOFENAC SODIUM 75 MG PO TBEC Oral Take 75 mg by mouth 2 (two) times daily.      Marland Kitchen DOCUSATE SODIUM 100 MG PO CAPS Oral Take 100 mg by mouth 2 (two) times daily.      Marland Kitchen ESOMEPRAZOLE MAGNESIUM 40 MG PO CPDR Oral Take 40 mg by mouth daily before breakfast.      . FENOFIBRATE 160 MG PO TABS Oral Take 160 mg by mouth daily.      Marland Kitchen HYDROCHLOROTHIAZIDE 25 MG PO TABS Oral Take 25 mg by mouth daily.      Marland Kitchen METHOCARBAMOL 500 MG PO TABS Oral Take 500 mg by mouth 4 (four) times daily.      Marland Kitchen NITROGLYCERIN 0.4 MG SL SUBL Sublingual Place 0.4 mg under the tongue every 5 (five) minutes as needed.      . OXYCODONE HCL 15 MG PO TABS Oral Take 15 mg by mouth every 4 (four) hours as needed.      Marland Kitchen OXYMORPHONE HCL ER 5 MG PO TB12 Oral Take 5 mg by mouth every 12 (twelve) hours.      Marland Kitchen PRASUGREL HCL 10 MG PO TABS Oral Take 10 mg by  mouth daily.      Marland Kitchen RAMIPRIL 5 MG PO TABS Oral Take 5 mg by mouth daily.      Marland Kitchen ROSUVASTATIN CALCIUM 10 MG PO TABS Oral Take 10 mg by mouth daily.      . TESTOSTERONE 20.25 MG/ACT (1.62%) TD GEL Transdermal Place 2 Squirts onto the skin daily.      . THIAMINE HCL 100 MG PO TABS Oral Take 100 mg by mouth daily.      . TRIAMCINOLONE ACETONIDE 55 MCG/ACT NA INHA Nasal Place 2 sprays into the nose daily.      Marland Kitchen ZOLPIDEM TARTRATE ER 12.5 MG PO TBCR Oral Take 12.5 mg by mouth at bedtime as needed.        BP 128/69  Pulse 75  Temp(Src) 97.8 F (36.6 C) (Oral)  Resp 18  Wt 230 lb (104.327 kg)  SpO2 95%  Physical Exam  Constitutional: He is oriented to person, place, and time. He appears well-developed and well-nourished. No distress.  HENT:  Head: Normocephalic and atraumatic.       Oozing from left nares, no septal hematoma Blood in Posterior pharynx  Eyes: Conjunctivae are normal. Pupils are equal, round,  and reactive to light.  Neck: Normal range of motion.  Cardiovascular: Normal rate, regular rhythm and normal heart sounds.   Pulmonary/Chest: Effort normal and breath sounds normal. No respiratory distress.  Abdominal: Soft. There is no tenderness. There is no rebound and no guarding.  Musculoskeletal: Normal range of motion. He exhibits no edema and no tenderness.  Neurological: He is alert and oriented to person, place, and time. No cranial nerve deficit.  Skin: Skin is warm.    ED Course  EPISTAXIS MANAGEMENT Date/Time: 11/06/2011 4:52 AM Performed by: Glynn Octave Authorized by: Glynn Octave Consent: Verbal consent obtained. Risks and benefits: risks, benefits and alternatives were discussed Consent given by: patient Patient understanding: patient states understanding of the procedure being performed Patient consent: the patient's understanding of the procedure matches consent given Patient identity confirmed: verbally with patient Time out: Immediately prior to procedure a "time out" was called to verify the correct patient, procedure, equipment, support staff and site/side marked as required. Local anesthetic: topical anesthetic Patient sedated: no Treatment site: left anterior and left posterior Repair method: suction and silver nitrate Post-procedure assessment: bleeding stopped Treatment complexity: simple Patient tolerance: Patient tolerated the procedure well with no immediate complications.   (including critical care time)  Labs Reviewed  CBC - Abnormal; Notable for the following:    RBC 4.14 (*)    Hemoglobin 12.8 (*)    HCT 38.6 (*)    All other components within normal limits  DIFFERENTIAL - Abnormal; Notable for the following:    Eosinophils Relative 7 (*)    All other components within normal limits  PROTIME-INR   No results found.   1. Epistaxis       MDM  Epistaxis on effient and ASA.  Vital signs stable. No chest pain or shortness of  breath. Actively oozing from left nares.  As a procedure note above, all clots were evacuated, pressure was applied, pledgets of Afrin were applied.  Cauterization to raw area of left septum.  Patient observed for 1.5 hours after cautery.  No further bleeding from nare.  No bleeding in posterior pharynx.  PAtient given strict return precautions.     Glynn Octave, MD 11/06/11 [phone]

## 2012-12-11 IMAGING — CR DG KNEE 1-2V PORT*L*
1 series · 2 of 2 positions shown · non-contrast
Comparison: None.

CLINICAL DATA: Preop total knee for arthritis.

PORTABLE LEFT KNEE - 1-2 VIEW

[Series 1: AP · left · 2 of 2 slices shown]
[im 1/2]
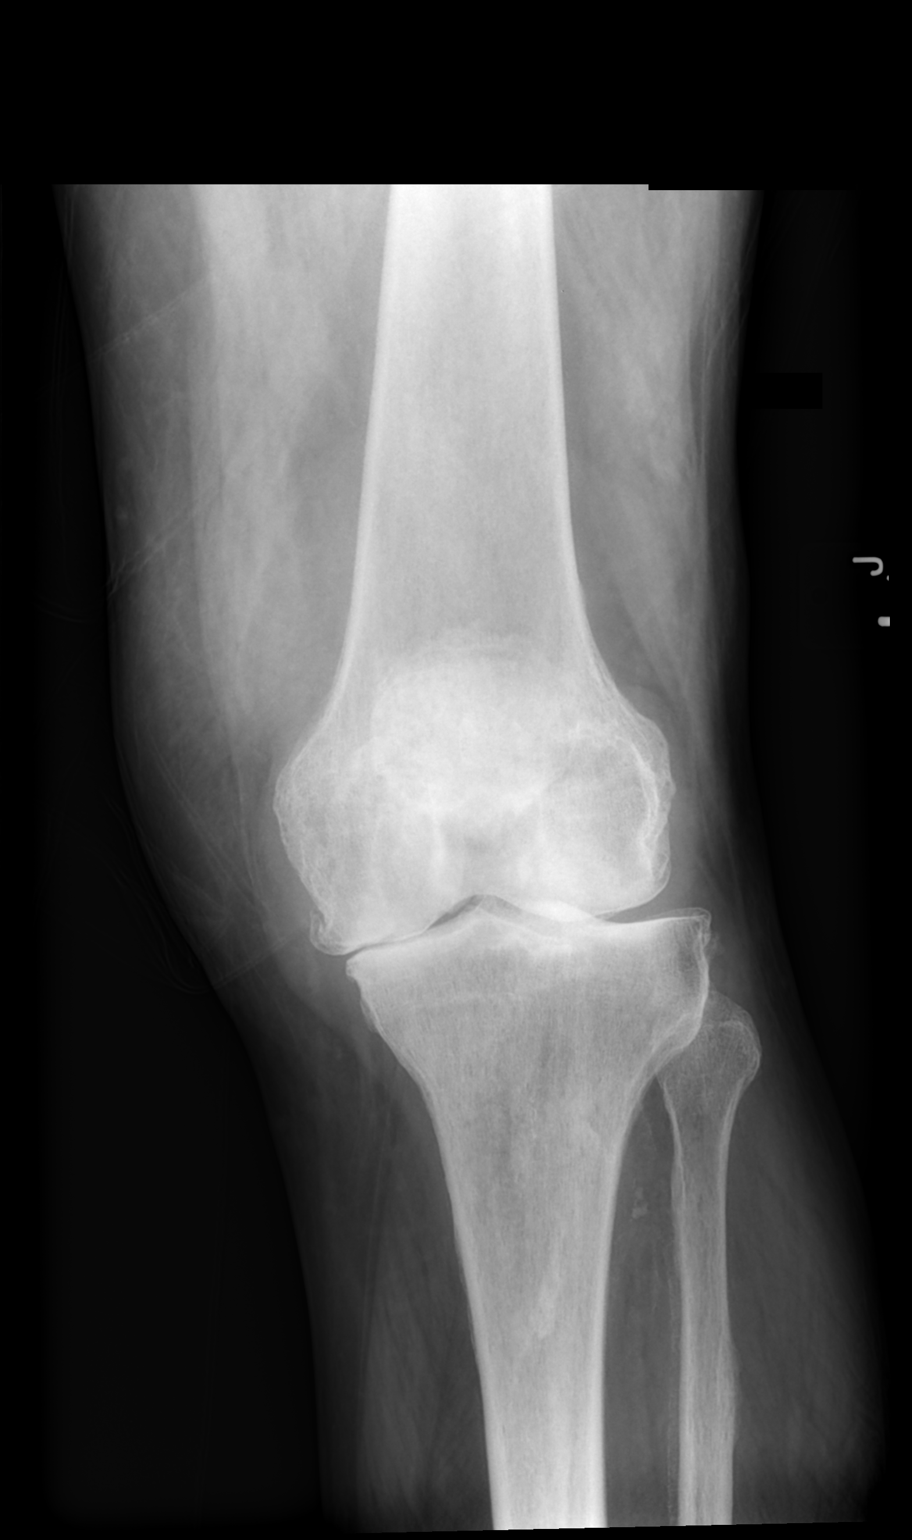
[im 2/2]
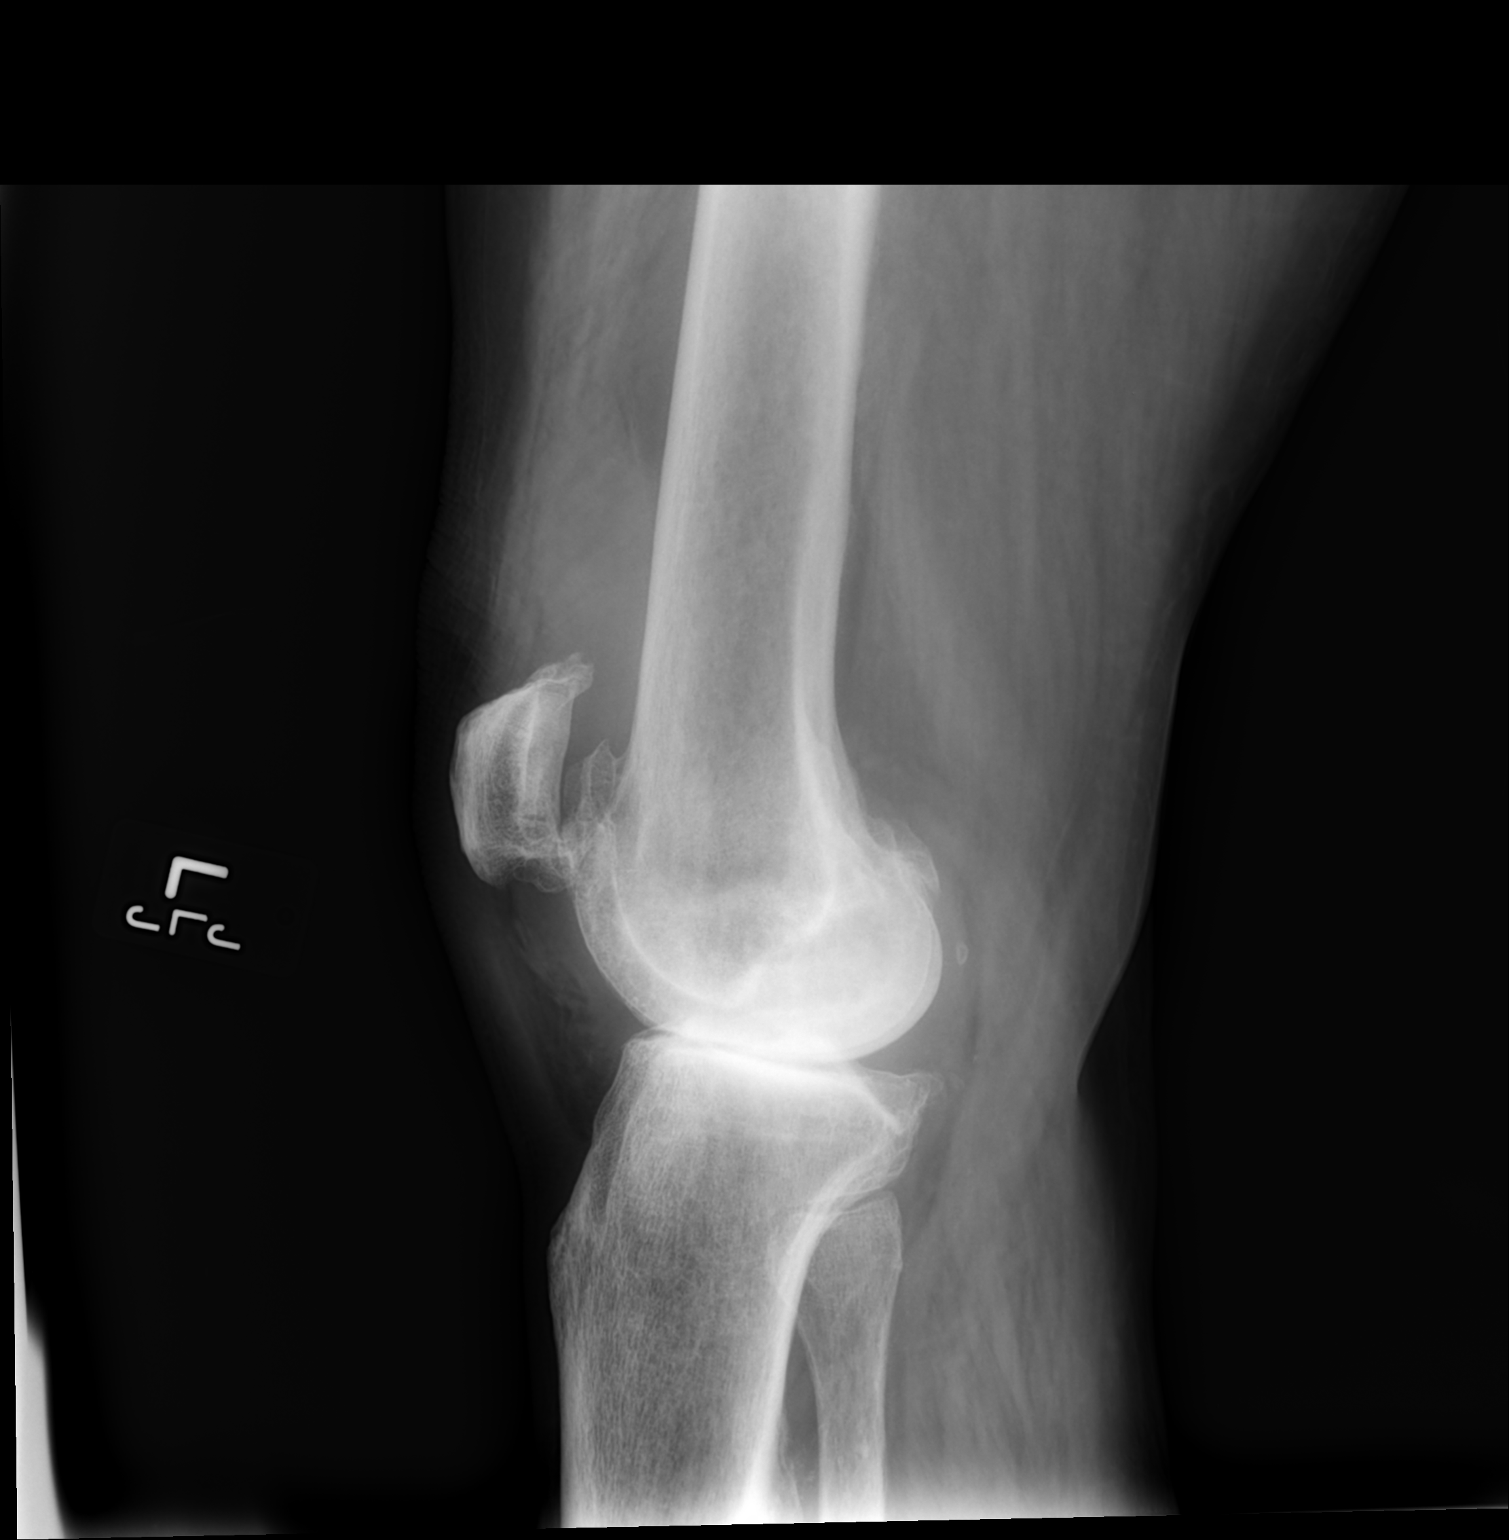

[2 of 2 positions shown; findings below may reference images not displayed]

FINDINGS: Tricompartment degenerative changes most notable
involving the medial tibial femoral joint space.  Joint effusion.
Question vascular calcifications?
IMPRESSION: Tricompartment degenerative changes most notable involving the
medial tibiofemoral joint space.

Joint effusion.

## 2012-12-11 IMAGING — CR DG KNEE 1-2V PORT*L*
1 series · 2 of 2 positions shown · non-contrast
Comparison: 04/03/2011

CLINICAL DATA: Postop left knee surgery.

PORTABLE LEFT KNEE - 1-2 VIEW

[Series 1: AP · left · 2 of 2 slices shown]
[im 1/2]
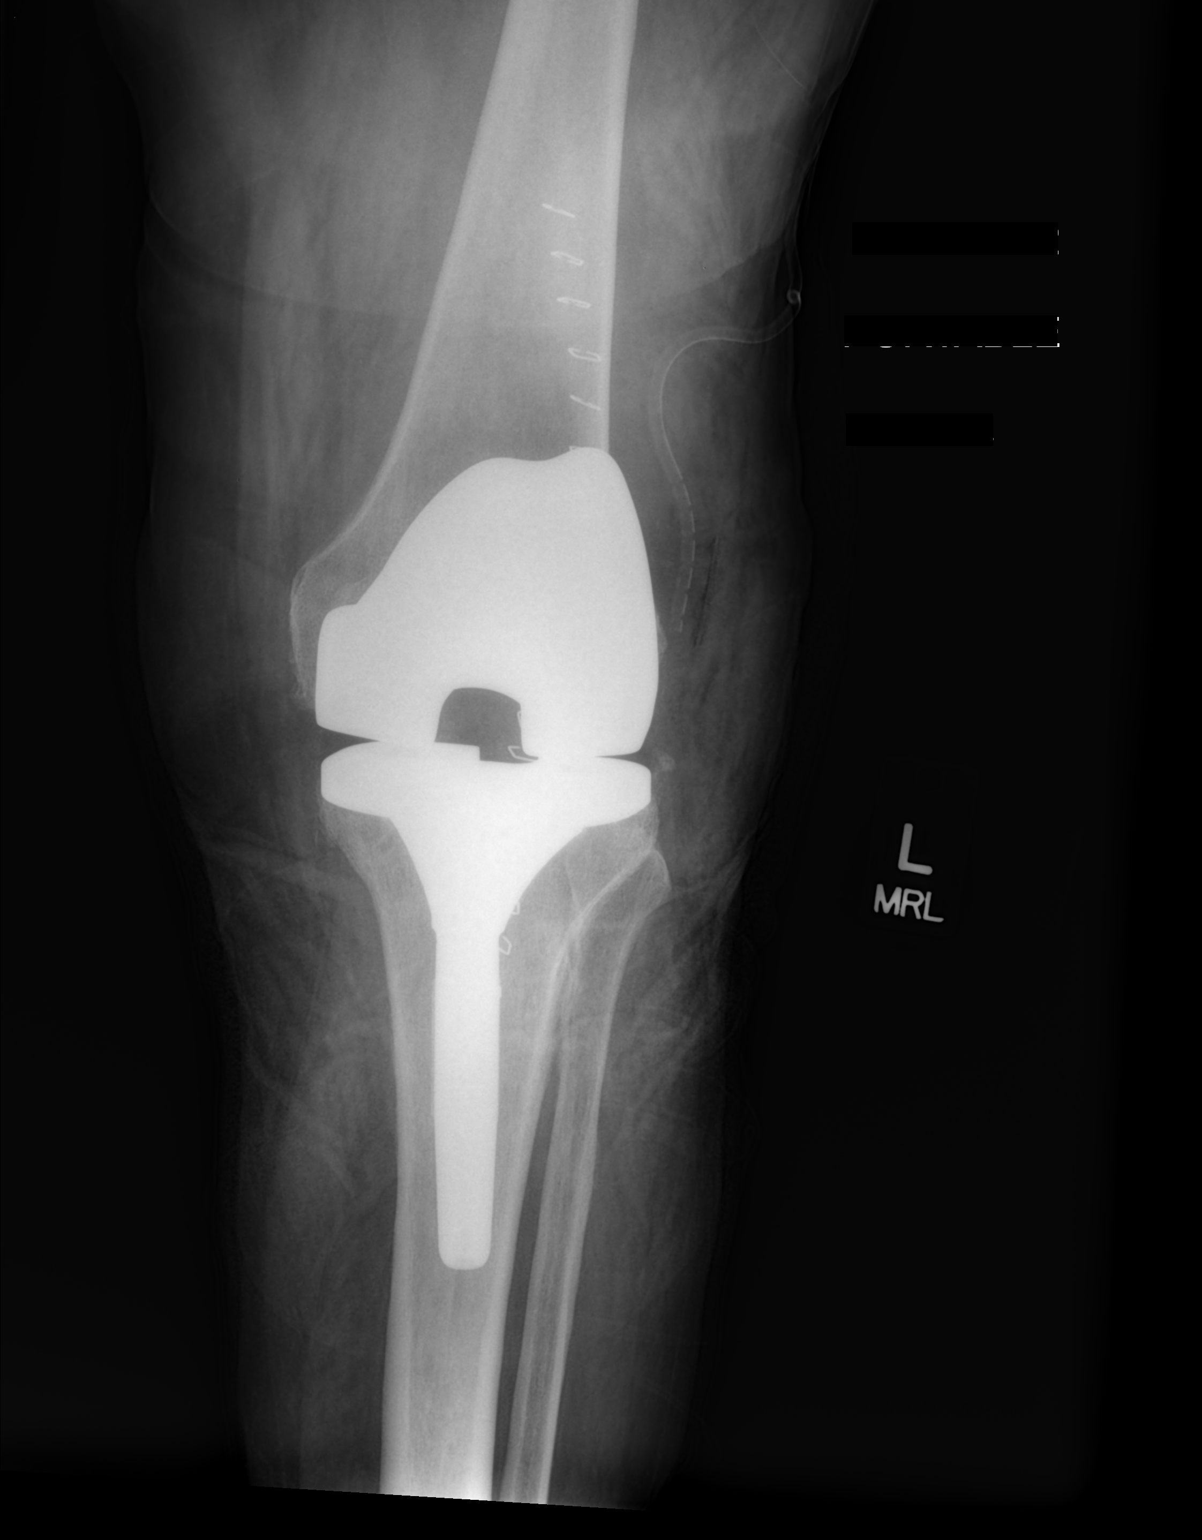
[im 2/2]
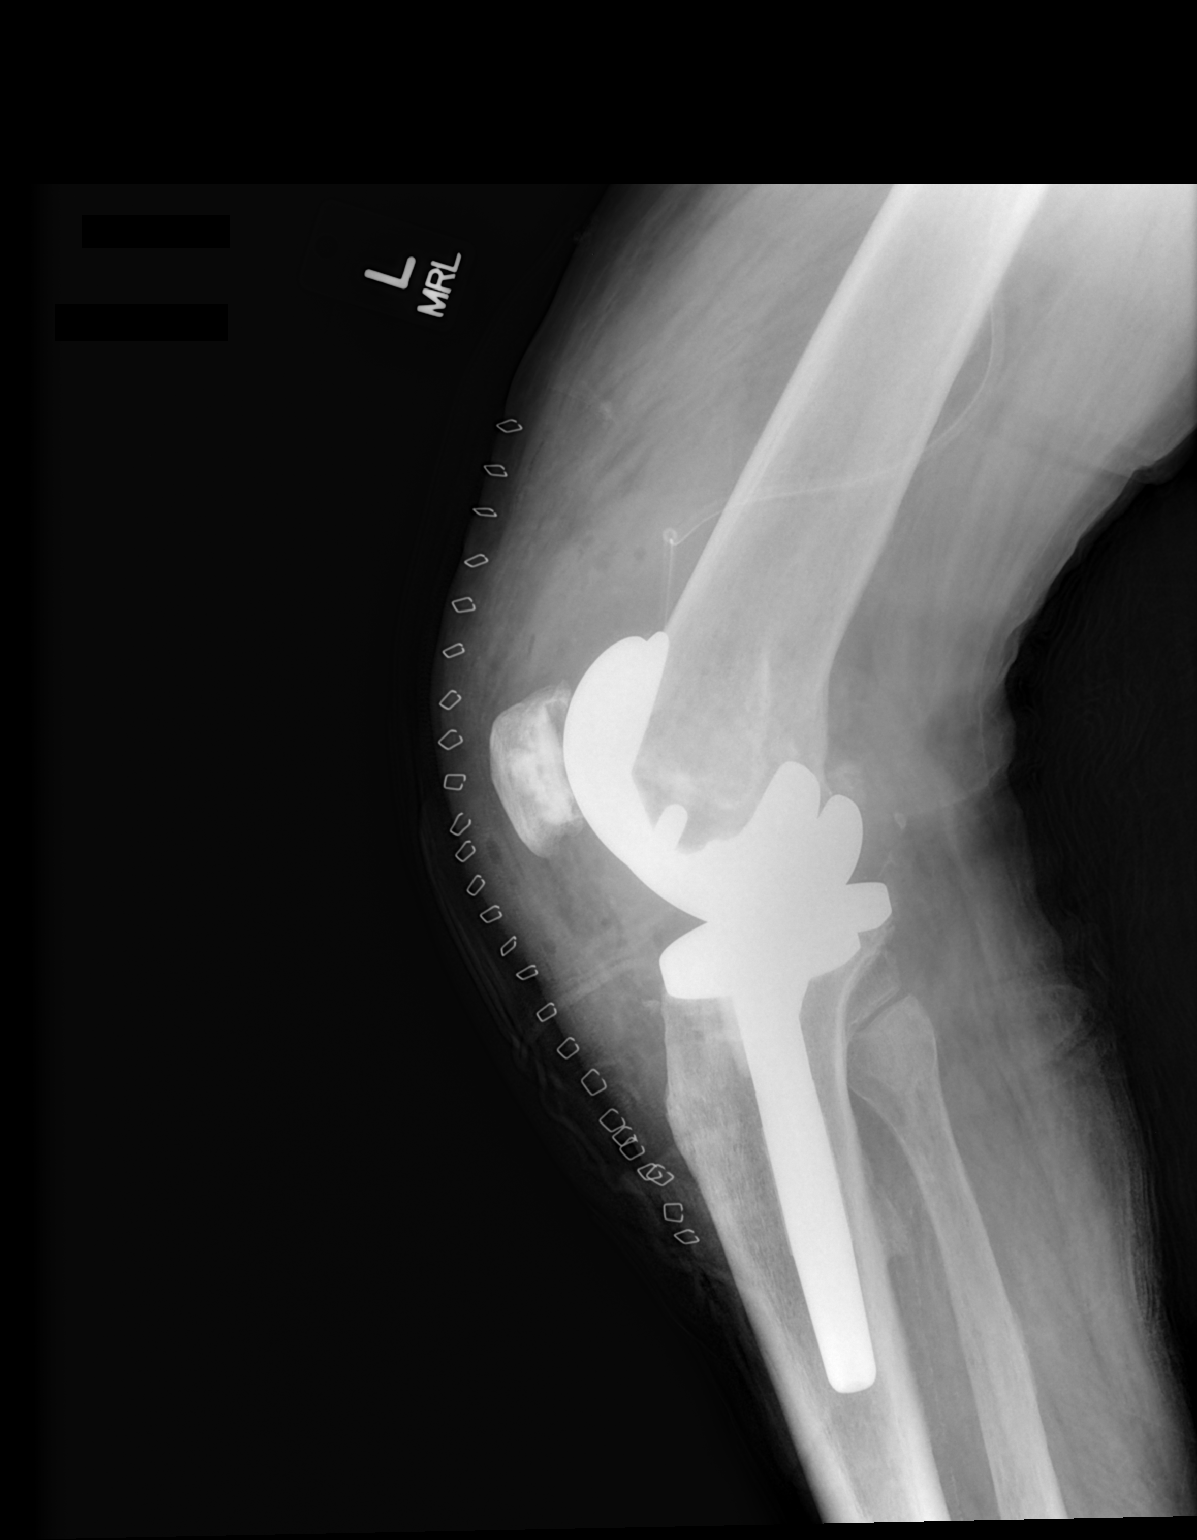

[2 of 2 positions shown; findings below may reference images not displayed]

FINDINGS: Surgical clips overlie the anterior aspect of the knee.
A surgical drain overlies the medial anterior portion of the knee.
Patient has had total knee arthroplasty.  Alignment is normal.  No
evidence for fracture.
IMPRESSION: Status post total knee arthroplasty.  No adverse features.

## 2013-10-12 ENCOUNTER — Telehealth: Payer: Self-pay | Admitting: *Deleted

## 2013-10-12 NOTE — Telephone Encounter (Signed)
Yes, pt is taking Crestor 10mg  daily and Effient 10 mg daily. Ok for samples.

## 2013-10-12 NOTE — Telephone Encounter (Signed)
Patient needs samples of crestor and effient. Is this ok?

## 2013-10-13 NOTE — Telephone Encounter (Signed)
Patient aware samples left at front desk for pick up.

## 2013-11-21 ENCOUNTER — Telehealth: Payer: Self-pay | Admitting: Cardiology

## 2013-11-21 NOTE — Telephone Encounter (Signed)
Refills for crestor 10 mg 1 box and 2 weeks of effient.

## 2013-11-21 NOTE — Telephone Encounter (Signed)
New message    [place]---want samples of crestor 10mg  and efffient 10mg .  Have not seen Dr Mayford Knife since she moved

## 2013-11-21 NOTE — Telephone Encounter (Signed)
OK to give samples can you please see if there are any samples for pt?

## 2013-12-21 ENCOUNTER — Ambulatory Visit: Payer: Medicare Other | Admitting: Pharmacist

## 2013-12-21 ENCOUNTER — Institutional Professional Consult (permissible substitution): Payer: Medicare Other | Admitting: Pharmacist

## 2013-12-29 ENCOUNTER — Telehealth: Payer: Self-pay | Admitting: *Deleted

## 2013-12-29 MED ORDER — CARVEDILOL 6.25 MG PO TABS: 6.2500 mg | ORAL_TABLET | Freq: Two times a day (BID) | ORAL | Status: DC

## 2013-12-29 NOTE — Telephone Encounter (Signed)
Rx sent in for pt.

## 2013-12-29 NOTE — Telephone Encounter (Signed)
Patient needs carvedilol refill to be sent to [facility]. Thanks, MI

## 2014-01-05 ENCOUNTER — Telehealth: Payer: Self-pay | Admitting: *Deleted

## 2014-01-05 NOTE — Telephone Encounter (Signed)
Patients wife request effient and crestor samples. I will put them at the front desk for pick up.

## 2014-01-18 ENCOUNTER — Telehealth: Payer: Self-pay | Admitting: *Deleted

## 2014-01-18 NOTE — Telephone Encounter (Signed)
I did not fill. I think this was a mistake. Pt is on ramipril but I did not send in a refill.

## 2014-01-18 NOTE — Telephone Encounter (Signed)
Pharmacy tech from [facility] called wanting to verify that someone from our office called in an rx for ramipril. They stated that they do not have that medication on file for the patient and are thinking it could have been an error. I do not see where anything was called in for the patient. Please advise. Thanks, MI

## 2014-01-19 ENCOUNTER — Other Ambulatory Visit: Payer: Self-pay | Admitting: General Surgery

## 2014-01-19 ENCOUNTER — Telehealth: Payer: Self-pay | Admitting: Cardiology

## 2014-01-19 MED ORDER — ROSUVASTATIN CALCIUM 10 MG PO TABS: 10.0000 mg | ORAL_TABLET | Freq: Every day | ORAL | Status: DC

## 2014-01-19 NOTE — Telephone Encounter (Signed)
New message      Refill crestor---[facility] [phone] enough to last until his next appt--02-13-14.  Pt has been out of medication for several weeks.  He has not seen Dr Radford Pax at the new office.  Want to pick up medication this afternoon if possible.

## 2014-01-19 NOTE — Telephone Encounter (Signed)
Pharmacy aware this was not called in for patient, he agreed that it must have been their mistake.

## 2014-01-19 NOTE — Telephone Encounter (Signed)
Rx sent in for pt.

## 2014-02-08 ENCOUNTER — Encounter: Payer: Self-pay | Admitting: General Surgery

## 2014-02-08 DIAGNOSIS — I2510 Atherosclerotic heart disease of native coronary artery without angina pectoris: Secondary | ICD-10-CM

## 2014-02-08 DIAGNOSIS — E669 Obesity, unspecified: Secondary | ICD-10-CM

## 2014-02-08 DIAGNOSIS — G4733 Obstructive sleep apnea (adult) (pediatric): Secondary | ICD-10-CM

## 2014-02-08 DIAGNOSIS — I10 Essential (primary) hypertension: Secondary | ICD-10-CM

## 2014-02-09 ENCOUNTER — Ambulatory Visit: Payer: Medicare Other | Admitting: Cardiology

## 2014-02-09 ENCOUNTER — Other Ambulatory Visit: Payer: Medicare Other

## 2014-02-12 ENCOUNTER — Ambulatory Visit (INDEPENDENT_AMBULATORY_CARE_PROVIDER_SITE_OTHER): Payer: Medicare Other | Admitting: *Deleted

## 2014-02-12 DIAGNOSIS — I10 Essential (primary) hypertension: Secondary | ICD-10-CM

## 2014-02-12 LAB — HEPATIC FUNCTION PANEL
ALT: 15 U/L (ref 0–53)
AST: 19 U/L (ref 0–37)
Albumin: 3.9 g/dL (ref 3.5–5.2)
Alkaline Phosphatase: 79 U/L (ref 39–117)
Bilirubin, Direct: 0.2 mg/dL (ref 0.0–0.3)
TOTAL PROTEIN: 6.9 g/dL (ref 6.0–8.3)
Total Bilirubin: 1.3 mg/dL — ABNORMAL HIGH (ref 0.3–1.2)

## 2014-02-12 LAB — LIPID PANEL
CHOLESTEROL: 131 mg/dL (ref 0–200)
HDL: 76.8 mg/dL (ref >39.00)
LDL CALC: 38 mg/dL (ref 0–99)
Total CHOL/HDL Ratio: 2
Triglycerides: 82 mg/dL (ref 0.0–149.0)
VLDL: 16.4 mg/dL (ref 0.0–40.0)

## 2014-02-13 ENCOUNTER — Ambulatory Visit (INDEPENDENT_AMBULATORY_CARE_PROVIDER_SITE_OTHER): Payer: Medicare Other | Admitting: Cardiology

## 2014-02-13 ENCOUNTER — Ambulatory Visit (INDEPENDENT_AMBULATORY_CARE_PROVIDER_SITE_OTHER): Payer: Medicare Other | Admitting: Pharmacist Clinician (PhC)/ Clinical Pharmacy Specialist

## 2014-02-13 ENCOUNTER — Institutional Professional Consult (permissible substitution): Payer: Medicare Other | Admitting: Pharmacist

## 2014-02-13 ENCOUNTER — Encounter: Payer: Self-pay | Admitting: Cardiology

## 2014-02-13 VITALS — BP 125/75 | HR 82 | Ht 70.5 in | Wt 230.0 lb

## 2014-02-13 DIAGNOSIS — I2510 Atherosclerotic heart disease of native coronary artery without angina pectoris: Secondary | ICD-10-CM

## 2014-02-13 DIAGNOSIS — E785 Hyperlipidemia, unspecified: Secondary | ICD-10-CM

## 2014-02-13 DIAGNOSIS — I10 Essential (primary) hypertension: Secondary | ICD-10-CM

## 2014-02-13 DIAGNOSIS — E669 Obesity, unspecified: Secondary | ICD-10-CM

## 2014-02-13 DIAGNOSIS — G4733 Obstructive sleep apnea (adult) (pediatric): Secondary | ICD-10-CM

## 2014-02-13 LAB — BASIC METABOLIC PANEL
BUN: 12 mg/dL (ref 6–23)
CHLORIDE: 95 meq/L — AB (ref 96–112)
CO2: 28 mEq/L (ref 19–32)
Calcium: 8.8 mg/dL (ref 8.4–10.5)
Creatinine, Ser: 1 mg/dL (ref 0.4–1.5)
GFR: 79.8 mL/min (ref >60.00)
Glucose, Bld: 100 mg/dL — ABNORMAL HIGH (ref 70–99)
POTASSIUM: 3.9 meq/L (ref 3.5–5.1)
SODIUM: 131 meq/L — AB (ref 135–145)

## 2014-02-13 NOTE — Patient Instructions (Addendum)
Your physician has recommended you make the following change in your medication: 1. D/C Effient  Samples of Crestor to last you a month was given at [facility] today.   Your physician recommends that you go to the lab today for a BMET  We contacted [facility] and they are looking into where that download is. They also will be giving you a new card.  Your physician wants you to follow-up in: 6 months with Dr Mallie Snooks will receive a reminder letter in the mail two months in advance. If you don't receive a letter, please call our office to schedule the follow-up appointment.

## 2014-02-13 NOTE — Patient Instructions (Signed)
We will switch your Crestor 10mg  to atorvastatin 20mg .  This is available generic and will cost less for you.  [facility] - [facility] or [phone] for options on low priced generics  Do not drink grapefruit juice - instead try apple, cranberry or orange juice

## 2014-02-13 NOTE — Progress Notes (Signed)
[place] [place], [place]  [id number] Phone: [phone] Fax:  [phone]  Date:  02/13/2014   ID:  Keith Bernard, DOB 1946/04/05, MRN [id number]  PCP:  [name], MD  Cardiologist:  Fransico Him, MD     History of Present Illness: Keith Bernard is a 68 y.o. male with a history of OSA on CPAP therapy, ASCAD, HTN, obesity and dyslipidemia.  He is doing well.  He denies any chest pain, LE edema, dizziness, palpitations or syncope.  He has chronic SOB from asthma.  He had a recent URI and has been on antibiotics and inhalers for asthma exacerbation.  He is using his CPAP without any problems.  He tolerates the nasal mask with chin strap and feels the pressure is adequate.  He feels rested when he gets up in the am and has no daytime sleepiness.    Wt Readings from Last 3 Encounters:  02/13/14 230 lb (104.327 kg)  11/06/11 230 lb (104.327 kg)     Past Medical History  Diagnosis Date  . 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 ETOH abuse   . GERD (gastroesophageal reflux disease)   . OSA (obstructive sleep apnea)     Moderate OSA w AHI 17.7/hr now on VPAP at EEP at 7cm H2O, min PS 3cm H2O,max PS 15cm H2O  . Hypertension   . DJD (degenerative joint disease)   . Chronic pain   . Depression   . Dyslipidemia     Current Outpatient Prescriptions  Medication Sig Dispense Refill  . ALPRAZolam (XANAX) 1 MG tablet Take 1 mg by mouth as needed for anxiety.      . Ascorbic Acid (VITAMIN C) 1000 MG tablet Take 1,000 mg by mouth daily.      Marland Kitchen aspirin 81 MG tablet Take 81 mg by mouth daily.        . carvedilol (COREG) 6.25 MG tablet Take 1 tablet (6.25 mg total) by mouth 2 (two) times daily with a meal.  60 tablet  3  . desvenlafaxine (PRISTIQ) 50 MG 24 hr tablet Take 50 mg by mouth daily.      Marland Kitchen docusate  sodium (COLACE) 100 MG capsule Take 100 mg by mouth 2 (two) times daily.        Marland Kitchen esomeprazole (NEXIUM) 40 MG capsule Take 40 mg by mouth daily before breakfast.        . ferrous fumarate (HEMOCYTE - 106 MG FE) 325 (106 FE) MG TABS tablet Take 1 tablet by mouth daily.      . hydrochlorothiazide (HYDRODIURIL) 25 MG tablet Take 25 mg by mouth daily.        . hydroxypropyl methylcellulose (ISOPTO TEARS) 2.5 % ophthalmic solution 1 drop 3 (three) times daily as needed for dry eyes.      Marland Kitchen levofloxacin (LEVAQUIN) 750 MG tablet Take 750 mg by mouth daily.      . methocarbamol (ROBAXIN) 500 MG tablet Take 500 mg by mouth 4 (four) times daily.        . mometasone-formoterol (DULERA) 100-5 MCG/ACT AERO Inhale 2 puffs into the lungs as needed for wheezing.      . Multiple Vitamin (MULTIVITAMIN) tablet Take 1 tablet by mouth daily.      . nitroGLYCERIN (NITROSTAT)  0.4 MG SL tablet Place 0.4 mg under the tongue every 5 (five) minutes as needed.        Marland Kitchen oxyCODONE (ROXICODONE) 15 MG immediate release tablet Take 15 mg by mouth every 4 (four) hours as needed.        Marland Kitchen oxymetazoline (AFRIN) 0.05 % nasal spray Place 1 spray into both nostrils 2 (two) times daily as needed for congestion.      Marland Kitchen oxymorphone (OPANA ER) 5 MG 12 hr tablet Take 5 mg by mouth every 12 (twelve) hours.        . prasugrel (EFFIENT) 10 MG TABS Take 10 mg by mouth daily.        . ramipril (ALTACE) 5 MG tablet Take 5 mg by mouth daily.        . rosuvastatin (CRESTOR) 10 MG tablet Take 1 tablet (10 mg total) by mouth daily.  30 tablet  0  . Testosterone (ANDROGEL PUMP) 20.25 MG/ACT (1.62%) GEL Place 2 Squirts onto the skin daily.        Marland Kitchen thiamine 100 MG tablet Take 100 mg by mouth daily.        Marland Kitchen triamcinolone (NASACORT) 55 MCG/ACT nasal inhaler Place 2 sprays into the nose daily.        Marland Kitchen zolpidem (AMBIEN) 10 MG tablet Take 10 mg by mouth at bedtime as needed for sleep.       No current facility-administered medications for this visit.     Allergies:   No Known Allergies  Social History:  The patient  reports that he has never smoked. He does not have any smokeless tobacco history on file. He reports that he drinks alcohol. He reports that he does not use illicit drugs.   Family History:  The patient's family history includes Cancer in his father.   ROS:  Please see the history of present illness.      All other systems reviewed and negative.   PHYSICAL EXAM: VS:  BP 125/75  Pulse 82  Ht 5' 10.5" (1.791 m)  Wt 230 lb (104.327 kg)  BMI 32.52 kg/m2 Well nourished, well developed, in no acute distress HEENT: normal Neck: no JVD Cardiac:  normal S1, S2; RRR; no murmur Lungs:  Few scattered wheezes posteriorly Abd: soft, nontender, no hepatomegaly Ext: no edema Skin: warm and dry Neuro:  CNs 2-12 intact, no focal abnormalities noted     ASSESSMENT AND PLAN:  1. ASCAD with no angina - continue ASA - Stop Effient - he had a BMS in 2010 and has been stable 2. HTN - controlled - continue Altace/HCTZ/carvedilol - check BMET 3. OSA on CPAP - I will get a download from DME 4. Obesity 5. Dyslipidemia - lipids at goal - continue statin  Followup with me in 6 months  Signed, Fransico Him, MD 02/13/2014 3:27 PM

## 2014-02-14 ENCOUNTER — Encounter: Payer: Self-pay | Admitting: General Surgery

## 2014-02-14 ENCOUNTER — Other Ambulatory Visit: Payer: Self-pay | Admitting: General Surgery

## 2014-02-14 DIAGNOSIS — E785 Hyperlipidemia, unspecified: Secondary | ICD-10-CM

## 2014-02-14 MED ORDER — ATORVASTATIN CALCIUM 20 MG PO TABS: 20.0000 mg | ORAL_TABLET | Freq: Every day | ORAL | Status: DC

## 2014-02-14 NOTE — Progress Notes (Signed)
02/14/2014 AEDAN GEIMER 1946/11/12 [id number]   HPI:  Keith Bernard is a 68 y.o. male patient of Dr Radford Pax, with a PMH below who presents today for questions about medications and costs.  He has previously been taking Crestor and Effient and had problems with cost due to the Medicare "donut hole".  He and his wife were concerned that he has too many medicines and because they go to multiple pharmacies they worry about potential drug interactions.  They are also concerned that his primary MD has recently switched his anti-depressant medication and he feels the current medication (Pristiq) is not working for him.  His most current lipid panel on Crestor 10mg  daily shows TC 151, TG 107, HDL 88.     Current Outpatient Prescriptions  Medication Sig Dispense Refill  . ALPRAZolam (XANAX) 1 MG tablet Take 1 mg by mouth as needed for anxiety.      . Ascorbic Acid (VITAMIN C) 1000 MG tablet Take 1,000 mg by mouth daily.      Marland Kitchen aspirin 81 MG tablet Take 81 mg by mouth daily.        . carvedilol (COREG) 6.25 MG tablet Take 1 tablet (6.25 mg total) by mouth 2 (two) times daily with a meal.  60 tablet  3  . desvenlafaxine (PRISTIQ) 50 MG 24 hr tablet Take 50 mg by mouth daily.      Marland Kitchen docusate sodium (COLACE) 100 MG capsule Take 100 mg by mouth 2 (two) times daily.        Marland Kitchen esomeprazole (NEXIUM) 40 MG capsule Take 40 mg by mouth daily before breakfast.        . ferrous fumarate (HEMOCYTE - 106 MG FE) 325 (106 FE) MG TABS tablet Take 1 tablet by mouth daily.      . hydrochlorothiazide (HYDRODIURIL) 25 MG tablet Take 25 mg by mouth daily.        . hydroxypropyl methylcellulose (ISOPTO TEARS) 2.5 % ophthalmic solution 1 drop 3 (three) times daily as needed for dry eyes.      Marland Kitchen levofloxacin (LEVAQUIN) 750 MG tablet Take 750 mg by mouth daily.      . methocarbamol (ROBAXIN) 500 MG tablet Take 500 mg by mouth 4 (four) times daily.        . mometasone-formoterol (DULERA) 100-5 MCG/ACT AERO Inhale 2  puffs into the lungs as needed for wheezing.      . Multiple Vitamin (MULTIVITAMIN) tablet Take 1 tablet by mouth daily.      . nitroGLYCERIN (NITROSTAT) 0.4 MG SL tablet Place 0.4 mg under the tongue every 5 (five) minutes as needed.        Marland Kitchen oxyCODONE (ROXICODONE) 15 MG immediate release tablet Take 15 mg by mouth every 4 (four) hours as needed.        Marland Kitchen oxymetazoline (AFRIN) 0.05 % nasal spray Place 1 spray into both nostrils 2 (two) times daily as needed for congestion.      Marland Kitchen oxymorphone (OPANA ER) 5 MG 12 hr tablet Take 5 mg by mouth every 12 (twelve) hours.        . ramipril (ALTACE) 5 MG tablet Take 5 mg by mouth daily.        . rosuvastatin (CRESTOR) 10 MG tablet Take 1 tablet (10 mg total) by mouth daily.  30 tablet  0  . Testosterone (ANDROGEL PUMP) 20.25 MG/ACT (1.62%) GEL Place 2 Squirts onto the skin daily.        Marland Kitchen  thiamine 100 MG tablet Take 100 mg by mouth daily.        Marland Kitchen triamcinolone (NASACORT) 55 MCG/ACT nasal inhaler Place 2 sprays into the nose daily.        Marland Kitchen zolpidem (AMBIEN) 10 MG tablet Take 10 mg by mouth at bedtime as needed for sleep.       No current facility-administered medications for this visit.    No Known Allergies  Past Medical History  Diagnosis Date  . 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 ETOH abuse   . GERD (gastroesophageal reflux disease)   . OSA (obstructive sleep apnea)     Moderate OSA w AHI 17.7/hr now on VPAP at EEP at 7cm H2O, min PS 3cm H2O,max PS 15cm H2O  . Hypertension   . DJD (degenerative joint disease)   . Chronic pain   . Depression   . Dyslipidemia     BP 125/75  HR 82  ASSESSMENT AND PLAN: Mr Sarr has some anxiety about his medications today.  I assured him that his current regimen does not have any serious drug interactions.  I did encourage him to pick just  one pharmacy to fill all of his prescriptions and thus decrease the risk of getting a prescription that may interact with his current medications.  To help on the costs of his medications I will switch his Crestor 10mg  to atorvastatin 20mg .  I assured him that with his excellent cholesterol profile this would be an acceptable change.  I also gave them information on [facility], a pharmacy in [place] that offers cash deals for 3-12 months supplies of common generic medications.  By not involving the insurance companies patients can sometimes get better deals than their insurance copays, and these do not work against the "donut hole".   I also suggested that he contact his primary MD regarding the change in his anti-depressant, as this is not within our scope of practice.  Tommy Medal PharmD CPP [facility]

## 2014-03-05 ENCOUNTER — Encounter: Payer: Self-pay | Admitting: Cardiology

## 2014-03-12 ENCOUNTER — Other Ambulatory Visit: Payer: Medicare Other

## 2014-03-30 ENCOUNTER — Encounter: Payer: Self-pay | Admitting: General Surgery

## 2014-03-31 ENCOUNTER — Other Ambulatory Visit: Payer: Self-pay | Admitting: Cardiology

## 2014-06-21 ENCOUNTER — Encounter: Payer: Self-pay | Admitting: Cardiology

## 2014-07-09 ENCOUNTER — Other Ambulatory Visit: Payer: Self-pay | Admitting: Cardiology

## 2014-07-31 ENCOUNTER — Encounter: Payer: Self-pay | Admitting: Cardiology

## 2014-08-17 ENCOUNTER — Other Ambulatory Visit: Payer: Medicare Other

## 2014-08-29 ENCOUNTER — Ambulatory Visit (INDEPENDENT_AMBULATORY_CARE_PROVIDER_SITE_OTHER): Payer: Medicare Other | Admitting: Cardiology

## 2014-08-29 ENCOUNTER — Encounter: Payer: Self-pay | Admitting: Cardiology

## 2014-08-29 VITALS — BP 120/80 | HR 64 | Ht 70.5 in | Wt 237.2 lb

## 2014-08-29 DIAGNOSIS — E669 Obesity, unspecified: Secondary | ICD-10-CM

## 2014-08-29 DIAGNOSIS — G4733 Obstructive sleep apnea (adult) (pediatric): Secondary | ICD-10-CM

## 2014-08-29 DIAGNOSIS — E785 Hyperlipidemia, unspecified: Secondary | ICD-10-CM

## 2014-08-29 DIAGNOSIS — I2510 Atherosclerotic heart disease of native coronary artery without angina pectoris: Secondary | ICD-10-CM

## 2014-08-29 DIAGNOSIS — I4510 Unspecified right bundle-branch block: Secondary | ICD-10-CM | POA: Insufficient documentation

## 2014-08-29 DIAGNOSIS — I10 Essential (primary) hypertension: Secondary | ICD-10-CM

## 2014-08-29 NOTE — Patient Instructions (Signed)
Your physician recommends that you continue on your current medications as directed. Please refer to the Current Medication list given to you today.  Your physician recommends that you return for lab work within the next week or so for FASTING Lipid, ALT, and BMET Please schedule at check out prior to leaving today  Your physician wants you to follow-up in: 6 months with Dr Mallie Snooks will receive a reminder letter in the mail two months in advance. If you don't receive a letter, please call our office to schedule the follow-up appointment.

## 2014-08-29 NOTE — Progress Notes (Signed)
[place], [address] [place], [place]  [address] Phone: [phone] Fax:  [phone]  Date:  08/29/2014   ID:  Keith Bernard, DOB [birth year], MRN [id number]  PCP:  [name], MD  Cardiologist:  Fransico Him, MD    History of Present Illness: Keith Bernard is a 68 y.o. male with a history of OSA on CPAP therapy, ASCAD, HTN, obesity and dyslipidemia. He is doing well. He denies any chest pain, dizziness, palpitations or syncope. He has chronic SOB from asthma.  He has chronic LE edema from his neuropathy.   He is using his CPAP without any problems. He tolerates the nasal mask with chin strap and feels the pressure is adequate. He feels rested when he gets up in the am and has no daytime sleepiness.    Wt Readings from Last 3 Encounters:  08/29/14 237 lb 3.2 oz (107.593 kg)  02/13/14 230 lb (104.327 kg)  11/06/11 230 lb (104.327 kg)     Past Medical History  Diagnosis Date  . 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 ETOH abuse   . GERD (gastroesophageal reflux disease)   . OSA (obstructive sleep apnea)     Moderate OSA w AHI 17.7/hr now on VPAP at EEP at 7cm H2O, min PS 3cm H2O,max PS 15cm H2O  . Hypertension   . DJD (degenerative joint disease)   . Chronic pain   . Depression   . Dyslipidemia   . RBBB     Current Outpatient Prescriptions  Medication Sig Dispense Refill  . ALPRAZolam (XANAX) 1 MG tablet Take 1 mg by mouth as needed for anxiety.      . Ascorbic Acid (VITAMIN C) 1000 MG tablet Take 1,000 mg by mouth daily.      Marland Kitchen aspirin 81 MG tablet Take 81 mg by mouth daily.        Marland Kitchen atorvastatin (LIPITOR) 20 MG tablet Take 1 tablet (20 mg total) by mouth daily.  90 tablet  3  . carvedilol (COREG) 6.25 MG tablet Take 1 tablet (6.25 mg total) by mouth 2 (two) times daily with a meal.  60 tablet  3  .  desvenlafaxine (PRISTIQ) 50 MG 24 hr tablet Take 50 mg by mouth daily.      Marland Kitchen docusate sodium (COLACE) 100 MG capsule Take 100 mg by mouth 2 (two) times daily.        . DULoxetine (CYMBALTA) 60 MG capsule       . esomeprazole (NEXIUM) 40 MG capsule Take 40 mg by mouth daily before breakfast.        . ferrous fumarate (HEMOCYTE - 106 MG FE) 325 (106 FE) MG TABS tablet Take 1 tablet by mouth daily.      . hydrochlorothiazide (HYDRODIURIL) 25 MG tablet Take 25 mg by mouth daily.        . hydroxypropyl methylcellulose (ISOPTO TEARS) 2.5 % ophthalmic solution 1 drop 3 (three) times daily as needed for dry eyes.      Marland Kitchen levofloxacin (LEVAQUIN) 750 MG tablet Take 750 mg by mouth daily.      . mometasone-formoterol (DULERA) 100-5 MCG/ACT AERO Inhale 2 puffs into the lungs as needed for wheezing.      . Multiple Vitamin (MULTIVITAMIN) tablet Take 1 tablet by mouth  daily.      . nitroGLYCERIN (NITROSTAT) 0.4 MG SL tablet Place 0.4 mg under the tongue every 5 (five) minutes as needed.        Marland Kitchen oxyCODONE (ROXICODONE) 15 MG immediate release tablet Take 15 mg by mouth every 4 (four) hours as needed.        Marland Kitchen oxymetazoline (AFRIN) 0.05 % nasal spray Place 1 spray into both nostrils 2 (two) times daily as needed for congestion.      Marland Kitchen oxymorphone (OPANA ER) 5 MG 12 hr tablet Take 5 mg by mouth every 12 (twelve) hours.        . predniSONE (DELTASONE) 20 MG tablet       . PROAIR HFA 108 (90 BASE) MCG/ACT inhaler       . ramipril (ALTACE) 5 MG capsule TAKE 1 CAPSULE BY MOUTH ONCE A DAY  90 capsule  0  . SYMBICORT 160-4.5 MCG/ACT inhaler       . Testosterone (ANDROGEL PUMP) 20.25 MG/ACT (1.62%) GEL Place 2 Squirts onto the skin daily.        Marland Kitchen thiamine 100 MG tablet Take 100 mg by mouth daily.        Marland Kitchen triamcinolone (NASACORT) 55 MCG/ACT nasal inhaler Place 2 sprays into the nose daily.        Marland Kitchen VIIBRYD 40 MG TABS       . zolpidem (AMBIEN) 10 MG tablet Take 10 mg by mouth at bedtime as needed for sleep.       No  current facility-administered medications for this visit.    Allergies:   No Known Allergies  Social History:  The patient  reports that he has never smoked. He does not have any smokeless tobacco history on file. He reports that he drinks alcohol. He reports that he does not use illicit drugs.   Family History:  The patient's family history includes Cancer in his father.   ROS:  Please see the history of present illness.      All other systems reviewed and negative.   PHYSICAL EXAM: VS:  BP 120/80  Pulse 64  Ht 5' 10.5" (1.791 m)  Wt 237 lb 3.2 oz (107.593 kg)  BMI 33.54 kg/m2 Well nourished, well developed, in no acute distress HEENT: normal Neck: no JVD Cardiac:  normal S1, S2; RRR; no murmur Lungs:  clear to auscultation bilaterally, no wheezing, rhonchi or rales Abd: soft, nontender, no hepatomegaly Ext: no edema Skin: warm and dry Neuro:  CNs 2-12 intact, no focal abnormalities noted  EKG:    NSR with RBBB  ASSESSMENT AND PLAN:  1. ASCAD with no angina - continue ASA  2. HTN - controlled - continue Altace/HCTZ/carvedilol  - check BMET 3. OSA on CPAP - I will get a download from DME  4. Obesity 5. Dyslipidemia - lipids at goal - continue statin  - check FLP and ALT  Followup with me in 6 months     Signed, Fransico Him, MD [facility] 08/29/2014 3:37 PM

## 2014-08-31 ENCOUNTER — Ambulatory Visit: Payer: Medicare Other | Admitting: Cardiology

## 2014-09-05 ENCOUNTER — Other Ambulatory Visit (INDEPENDENT_AMBULATORY_CARE_PROVIDER_SITE_OTHER): Payer: Medicare Other | Admitting: *Deleted

## 2014-09-05 DIAGNOSIS — I10 Essential (primary) hypertension: Secondary | ICD-10-CM

## 2014-09-05 DIAGNOSIS — E785 Hyperlipidemia, unspecified: Secondary | ICD-10-CM

## 2014-09-05 LAB — BASIC METABOLIC PANEL
BUN: 17 mg/dL (ref 6–23)
CALCIUM: 8.4 mg/dL (ref 8.4–10.5)
CHLORIDE: 100 meq/L (ref 96–112)
CO2: 31 meq/L (ref 19–32)
Creatinine, Ser: 0.8 mg/dL (ref 0.4–1.5)
GFR: 108.09 mL/min (ref >60.00)
Glucose, Bld: 86 mg/dL (ref 70–99)
POTASSIUM: 3.9 meq/L (ref 3.5–5.1)
SODIUM: 138 meq/L (ref 135–145)

## 2014-09-05 LAB — LIPID PANEL
CHOL/HDL RATIO: 3
Cholesterol: 142 mg/dL (ref 0–200)
HDL: 53.7 mg/dL (ref >39.00)
NONHDL: 88.3
VLDL: 88.8 mg/dL — AB (ref 0.0–40.0)

## 2014-09-05 LAB — ALT: ALT: 28 U/L (ref 0–53)

## 2014-09-06 ENCOUNTER — Other Ambulatory Visit: Payer: Medicare Other

## 2014-09-06 LAB — LDL CHOLESTEROL, DIRECT: Direct LDL: 40.5 mg/dL

## 2014-09-11 ENCOUNTER — Telehealth: Payer: Self-pay | Admitting: Cardiology

## 2014-09-11 ENCOUNTER — Other Ambulatory Visit: Payer: Self-pay | Admitting: General Surgery

## 2014-09-11 DIAGNOSIS — E785 Hyperlipidemia, unspecified: Secondary | ICD-10-CM

## 2014-09-11 NOTE — Telephone Encounter (Signed)
Called pt to reschedule lab work.

## 2014-09-11 NOTE — Telephone Encounter (Signed)
New problem   Pt returning call to nurse from yesterday. Please call pt.

## 2014-09-11 NOTE — Telephone Encounter (Signed)
Pt r/s and aware to fast.

## 2014-09-12 ENCOUNTER — Other Ambulatory Visit: Payer: Medicare Other

## 2014-09-14 ENCOUNTER — Other Ambulatory Visit: Payer: Medicare Other

## 2014-09-20 ENCOUNTER — Other Ambulatory Visit (INDEPENDENT_AMBULATORY_CARE_PROVIDER_SITE_OTHER): Payer: Medicare Other | Admitting: *Deleted

## 2014-09-20 DIAGNOSIS — E785 Hyperlipidemia, unspecified: Secondary | ICD-10-CM

## 2014-09-21 LAB — LIPID PANEL
Cholesterol: 163 mg/dL (ref 0–200)
HDL: 88.1 mg/dL (ref >39.00)
LDL CALC: 50 mg/dL (ref 0–99)
NonHDL: 74.9
Total CHOL/HDL Ratio: 2
Triglycerides: 126 mg/dL (ref 0.0–149.0)
VLDL: 25.2 mg/dL (ref 0.0–40.0)

## 2014-09-27 ENCOUNTER — Telehealth: Payer: Self-pay | Admitting: Cardiology

## 2014-09-27 NOTE — Telephone Encounter (Signed)
Patient informed of recent lab numbers. He wanted triglyceride results.  Instructed patient to call back with any questions or concerns.

## 2014-09-27 NOTE — Telephone Encounter (Signed)
Follow Up      Pt calling stating he received a message to call back in regards to his lab work. Please call pt back and advise.

## 2014-10-18 ENCOUNTER — Other Ambulatory Visit: Payer: Self-pay | Admitting: Cardiology

## 2014-12-10 ENCOUNTER — Encounter: Payer: Self-pay | Admitting: Cardiology

## 2015-02-02 ENCOUNTER — Other Ambulatory Visit: Payer: Self-pay | Admitting: Cardiology

## 2015-02-09 ENCOUNTER — Other Ambulatory Visit: Payer: Self-pay | Admitting: Cardiology

## 2015-03-12 ENCOUNTER — Ambulatory Visit: Payer: Medicare Other | Admitting: Cardiology

## 2015-04-16 ENCOUNTER — Other Ambulatory Visit: Payer: Self-pay | Admitting: *Deleted

## 2015-04-16 MED ORDER — ATORVASTATIN CALCIUM 20 MG PO TABS: 20.0000 mg | ORAL_TABLET | Freq: Every day | ORAL | Status: DC

## 2015-04-17 ENCOUNTER — Other Ambulatory Visit: Payer: Self-pay

## 2015-04-17 MED ORDER — ATORVASTATIN CALCIUM 20 MG PO TABS: 20.0000 mg | ORAL_TABLET | Freq: Every day | ORAL | Status: DC

## 2015-05-01 NOTE — Progress Notes (Signed)
Cardiology Office Note   Date:  05/02/2015   ID:  Keith Bernard, DOB 1946-08-11, MRN [id number]  PCP:  [name], MD    Chief Complaint  Patient presents with  . Follow-up    OSA, CAD, HTN      History of Present Illness: Keith Bernard is a 69 y.o. male with a history of OSA on CPAP therapy, ASCAD, HTN, obesity and dyslipidemia. He is doing well. He denies any chest pain, dizziness, palpitations or syncope. He has chronic SOB from asthma that is fairly stable on inhalers. He has chronic LE edema from his neuropathy. He is using his CPAP without any problems. He tolerates the nasal mask with chin strap and feels the pressure is adequate. He feels rested when he gets up in the am and has no daytime sleepiness   Past Medical History  Diagnosis Date  . 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 ETOH abuse   . GERD (gastroesophageal reflux disease)   . OSA (obstructive sleep apnea)     Moderate OSA w AHI 17.7/hr now on VPAP at EEP at 7cm H2O, min PS 3cm H2O,max PS 15cm H2O  . Hypertension   . DJD (degenerative joint disease)   . Chronic pain   . Depression   . Dyslipidemia   . RBBB     Past Surgical History  Procedure Laterality Date  . Coronary stent placement    . Replacement total knee      left knee     Current Outpatient Prescriptions  Medication Sig Dispense Refill  . ALPRAZolam (XANAX) 1 MG tablet Take 0.5 mg by mouth as needed for anxiety.     Marland Kitchen aspirin 81 MG tablet Take 81 mg by mouth daily.      Marland Kitchen atorvastatin (LIPITOR) 20 MG tablet Take 1 tablet (20 mg total) by mouth daily. 30 tablet 5  . carvedilol (COREG) 6.25 MG tablet Take 1 tablet (6.25 mg total) by mouth 2 (two) times daily with a meal. 60 tablet 3  . docusate sodium (COLACE) 100 MG capsule Take 100 mg by mouth 2 (two)  times daily.      Marland Kitchen esomeprazole (NEXIUM) 40 MG capsule Take 40 mg by mouth daily before breakfast.      . hydrochlorothiazide (HYDRODIURIL) 25 MG tablet Take 25 mg by mouth daily.      . nitroGLYCERIN (NITROSTAT) 0.4 MG SL tablet Place 0.4 mg under the tongue every 5 (five) minutes as needed.      Marland Kitchen oxyCODONE (ROXICODONE) 15 MG immediate release tablet Take 15 mg by mouth every 4 (four) hours as needed.      Marland Kitchen PROAIR HFA 108 (90 BASE) MCG/ACT inhaler     . ramipril (ALTACE) 5 MG capsule TAKE 1 CAPSULE BY MOUTH ONCE A DAY 90 capsule 2  . SYMBICORT 160-4.5 MCG/ACT inhaler     . Testosterone (ANDROGEL PUMP) 20.25 MG/ACT (1.62%) GEL Place 2 Squirts onto the skin daily.      Marland Kitchen thiamine 100 MG tablet Take 100 mg by mouth daily.      Marland Kitchen zolpidem (AMBIEN) 10 MG tablet Take 12.5 mg by mouth at bedtime as needed for sleep.  No current facility-administered medications for this visit.    Allergies:   Review of patient's allergies indicates no known allergies.    Social History:  The patient  reports that he has never smoked. He does not have any smokeless tobacco history on file. He reports that he drinks alcohol. He reports that he does not use illicit drugs.   Family History:  The patient's family history includes Cancer in his father.    ROS:  Please see the history of present illness.   Otherwise, review of systems are positive for back and muscle pain, depression, constipation, anxiety.   All other systems are reviewed and negative.    PHYSICAL EXAM: VS:  BP 110/58 mmHg  Pulse 68  Ht 5' 10.5" (1.791 m)  Wt 236 lb 1.9 oz (107.103 kg)  BMI 33.39 kg/m2  SpO2 97% , BMI Body mass index is 33.39 kg/(m^2). GEN: Well nourished, well developed, in no acute distress HEENT: normal Neck: no JVD, carotid bruits, or masses Cardiac: RRR; no murmurs, rubs, or gallops.  Trace edema  Respiratory:  clear to auscultation bilaterally, normal work of breathing GI: soft, nontender, nondistended, +  BS MS: no deformity or atrophy Skin: warm and dry, no rash Neuro:  Strength and sensation are intact Psych: euthymic mood, full affect   EKG:  EKG is not ordered today.    Recent Labs: 09/05/2014: ALT 28; BUN 17; Creatinine 0.8; Potassium 3.9; Sodium 138    Lipid Panel    Component Value Date/Time   CHOL 163 09/20/2014 1323   TRIG 126.0 09/20/2014 1323   HDL 88.10 09/20/2014 1323   CHOLHDL 2 09/20/2014 1323   VLDL 25.2 09/20/2014 1323   LDLCALC 50 09/20/2014 1323   LDLDIRECT 40.5 09/05/2014 1140      Wt Readings from Last 3 Encounters:  05/02/15 236 lb 1.9 oz (107.103 kg)  08/29/14 237 lb 3.2 oz (107.593 kg)  02/13/14 230 lb (104.327 kg)    ASSESSMENT AND PLAN:  1. ASCAD with no angina - continue ASA  2. HTN - controlled - continue Altace/HCTZ/carvedilol  - check BMET 3. OSA on CPAP - I will get a download from DME  4. Obesity 5. Dyslipidemia - lipids at goal - continue statin  - check FLP and ALT   Current medicines are reviewed at length with the patient today.  The patient does not have concerns regarding medicines.  The following changes have been made:  no change  Labs/ tests ordered today: See above Assessment and Plan No orders of the defined types were placed in this encounter.     Disposition:   FU with me in 6 months with PA and 1 year with me  Signed, [name], MD  05/02/2015 11:49 AM    [facility] [place], [place],   [address] Phone: [phone]; Fax: [phone]

## 2015-05-02 ENCOUNTER — Encounter: Payer: Self-pay | Admitting: Cardiology

## 2015-05-02 ENCOUNTER — Ambulatory Visit (INDEPENDENT_AMBULATORY_CARE_PROVIDER_SITE_OTHER): Payer: Medicare Other | Admitting: Cardiology

## 2015-05-02 VITALS — BP 110/58 | HR 68 | Ht 70.5 in | Wt 236.1 lb

## 2015-05-02 DIAGNOSIS — E669 Obesity, unspecified: Secondary | ICD-10-CM

## 2015-05-02 DIAGNOSIS — E785 Hyperlipidemia, unspecified: Secondary | ICD-10-CM | POA: Diagnosis not present

## 2015-05-02 DIAGNOSIS — G4733 Obstructive sleep apnea (adult) (pediatric): Secondary | ICD-10-CM | POA: Diagnosis not present

## 2015-05-02 DIAGNOSIS — I2510 Atherosclerotic heart disease of native coronary artery without angina pectoris: Secondary | ICD-10-CM | POA: Diagnosis not present

## 2015-05-02 DIAGNOSIS — I10 Essential (primary) hypertension: Secondary | ICD-10-CM | POA: Diagnosis not present

## 2015-05-02 LAB — LIPID PANEL
Cholesterol: 154 mg/dL (ref 0–200)
HDL: 65.8 mg/dL (ref >39.00)
Total CHOL/HDL Ratio: 2
Triglycerides: 714 mg/dL — ABNORMAL HIGH (ref 0.0–149.0)

## 2015-05-02 LAB — LDL CHOLESTEROL, DIRECT: Direct LDL: 32 mg/dL

## 2015-05-02 NOTE — Patient Instructions (Signed)
Medication Instructions:  Your physician recommends that you continue on your current medications as directed. Please refer to the Current Medication list given to you today.   Labwork: TODAY: Lipids  Testing/Procedures: None  Follow-Up: Your physician wants you to follow-up in: 6 months with Richardson Dopp. You will receive a reminder letter in the mail two months in advance. If you don't receive a letter, please call our office to schedule the follow-up appointment.   Any Other Special Instructions Will Be Listed Below (If Applicable).

## 2015-05-06 ENCOUNTER — Ambulatory Visit (HOSPITAL_COMMUNITY): Payer: Medicare Other | Admitting: Nurse Practitioner

## 2015-05-07 ENCOUNTER — Ambulatory Visit (INDEPENDENT_AMBULATORY_CARE_PROVIDER_SITE_OTHER): Payer: Medicare Other | Admitting: Pharmacist

## 2015-05-07 ENCOUNTER — Encounter: Payer: Self-pay | Admitting: Cardiology

## 2015-05-07 DIAGNOSIS — E785 Hyperlipidemia, unspecified: Secondary | ICD-10-CM | POA: Diagnosis not present

## 2015-05-07 DIAGNOSIS — I2510 Atherosclerotic heart disease of native coronary artery without angina pectoris: Secondary | ICD-10-CM | POA: Diagnosis not present

## 2015-05-07 LAB — LIPID PANEL
Cholesterol: 147 mg/dL (ref 0–200)
HDL: 62.2 mg/dL (ref >39.00)
Total CHOL/HDL Ratio: 2

## 2015-05-07 LAB — LDL CHOLESTEROL, DIRECT: Direct LDL: 33 mg/dL

## 2015-05-07 NOTE — Patient Instructions (Addendum)
Start fish oil 2 capsules each day.    Will recheck your labs today.

## 2015-05-07 NOTE — Progress Notes (Signed)
Cardiology Office Note   Date:  05/07/2015   ID:  Keith Bernard, DOB 07-23-1946, MRN [id number]  PCP:  [name], MD    Chief Complaint  Patient presents with  . Hyperlipidemia      History of Present Illness: Keith Bernard is a 69 y.o. Male patient of Dr. Radford Pax with a history of OSA on CPAP therapy, ASCAD, HTN, obesity and dyslipidemia who was referred to the Lipid Clinic due to elevated TG.  Of note, these labs were drawn at 12pm.  Pt does not remember eating anything prior to [facility] but admits that when he takes Ambien, he will wake up during the night and eat without knowing it.  He has a hiostyr of elevated TG due to not fasting in October 2015.  He had his labs checked by his PCP in May and his TG were normal but HDL extremely elevated.    Labs:  05/2015- TC 154, TG 714, HDL 66, LDL 32 (? Fasting) 03/2015-  TC 167, TG 190, HDL 135, LDL not calculated (PCP) 08/2014-  TC 163, TG 126, HDL 88, LDL 50   Past Medical History  Diagnosis Date  . 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 ETOH abuse   . GERD (gastroesophageal reflux disease)   . OSA (obstructive sleep apnea)     Moderate OSA w AHI 17.7/hr now on VPAP at EEP at 7cm H2O, min PS 3cm H2O,max PS 15cm H2O  . Hypertension   . DJD (degenerative joint disease)   . Chronic pain   . Depression   . Dyslipidemia   . RBBB     Current Outpatient Prescriptions  Medication Sig Dispense Refill  . ALPRAZolam (XANAX) 1 MG tablet Take 0.5 mg by mouth as needed for anxiety.     Marland Kitchen aspirin 81 MG tablet Take 81 mg by mouth daily.      Marland Kitchen atorvastatin (LIPITOR) 20 MG tablet Take 1 tablet (20 mg total) by mouth daily. 30 tablet 5  . carvedilol (COREG) 6.25 MG tablet Take 1 tablet (6.25 mg total) by mouth 2 (two) times daily with a meal. 60 tablet 3  .  docusate sodium (COLACE) 100 MG capsule Take 100 mg by mouth 2 (two) times daily.      Marland Kitchen esomeprazole (NEXIUM) 40 MG capsule Take 40 mg by mouth daily before breakfast.      . hydrochlorothiazide (HYDRODIURIL) 25 MG tablet Take 25 mg by mouth daily.      . nitroGLYCERIN (NITROSTAT) 0.4 MG SL tablet Place 0.4 mg under the tongue every 5 (five) minutes as needed.      Marland Kitchen oxyCODONE (ROXICODONE) 15 MG immediate release tablet Take 15 mg by mouth every 4 (four) hours as needed.      Marland Kitchen PROAIR HFA 108 (90 BASE) MCG/ACT inhaler     . ramipril (ALTACE) 5 MG capsule TAKE 1 CAPSULE BY MOUTH ONCE A DAY 90 capsule 2  . SYMBICORT 160-4.5 MCG/ACT inhaler     . Testosterone (ANDROGEL PUMP) 20.25 MG/ACT (1.62%) GEL Place 2 Squirts onto the skin daily.      Marland Kitchen thiamine 100 MG tablet Take 100 mg by mouth daily.      Marland Kitchen  zolpidem (AMBIEN) 10 MG tablet Take 12.5 mg by mouth at bedtime as needed for sleep.      No current facility-administered medications for this visit.    Allergies:   Review of patient's allergies indicates no known allergies.    Social History:  The patient  reports that he has never smoked. He does not have any smokeless tobacco history on file. He reports that he drinks alcohol. He reports that he does not use illicit drugs.    ASSESSMENT AND PLAN:  1.  Hyperlipidemia- Given pt's TG were normal with his PCP less than 1 month ago, suspect the TG of >700 were false elevation due to not fasting.  This has occurred in the past as well.  Unfortunately, there is a large discrepancy between HDL with the 2 labs so we do not have an LDL when his TG were normal.  He is fasting today so he will recheck labs to ensure TG are normal and LDL is stable.    2.  Hypertension- pt reports only taking his carvediolol once daily.  All of his other medications are once a day so it is difficult for him to remember this one twice a day.  Will discuss with Dr. Radford Pax and see if we can switch him to metoprolol succinate  to help with compliance.   Cyndee Brightly [facility]  05/07/2015 1:50 PM    [facility] [place], [place], [place]  [address] Phone: [phone]; Fax: [phone]

## 2015-05-10 ENCOUNTER — Telehealth: Payer: Self-pay | Admitting: Pharmacist

## 2015-05-10 DIAGNOSIS — E785 Hyperlipidemia, unspecified: Secondary | ICD-10-CM

## 2015-05-10 MED ORDER — METOPROLOL SUCCINATE ER 50 MG PO TB24: 50.0000 mg | ORAL_TABLET | Freq: Every day | ORAL | Status: DC

## 2015-05-10 MED ORDER — FENOFIBRATE 160 MG PO TABS: 160.0000 mg | ORAL_TABLET | Freq: Every day | ORAL | Status: DC

## 2015-05-10 NOTE — Telephone Encounter (Signed)
LMOM with pt.  TG remained elevated even when fasting.  Will need to add fenofibrate 160mg  every day to his regimen.  Will also change carvediolol to metoprolol succinate 50mg  to help with compliance.  Lab placed to recheck in 3 months. Pt is agreeable to plan.

## 2015-05-27 ENCOUNTER — Other Ambulatory Visit: Payer: Self-pay

## 2015-06-18 ENCOUNTER — Encounter: Payer: Self-pay | Admitting: Cardiovascular Disease

## 2015-07-16 ENCOUNTER — Telehealth: Payer: Self-pay | Admitting: Cardiology

## 2015-07-16 DIAGNOSIS — J45901 Unspecified asthma with (acute) exacerbation: Secondary | ICD-10-CM

## 2015-07-16 NOTE — Telephone Encounter (Signed)
Refer to Dr. Ramaswamy 

## 2015-07-16 NOTE — Telephone Encounter (Signed)
Pt and wife calling to inform Dr Radford Pax that the pt has been having exacerbations of his asthma the past 2 months, and he went to his PCP about this, and his PCP advised him to follow-up with Dr Radford Pax to reconsider giving the pt ramipril and metoprolol, for this can exacerbate his asthma.  Informed both the pt and wife that Dr Radford Pax is out of the office this week, but I spoke with our Pharmacist Megan, and per [facility] both ramipril and metoprolol will not exacerbate his asthma.  Informed both parties that per [facility] PharmD, his metoprolol that he is taking, is cardiac selective, so this should not exacerbate his asthma.  Informed both parties that per [facility] PharmD, also ramipril will not affect his airway or asthma.  Informed both parties that per [facility] PharmD, the pt should actually remain on both of these meds, for they are imperative to his cardiac history, and prior heart attack.  Informed both parties that per [facility], it would be unsafe for the pt to come off of either of these meds.  Informed both parties that per [facility] PharmD, she advises that the pt seek out his Pulmonologist, to advise on titration of asthma medication. Per the pt and wife, he has not been compliant with following up with his Pulmonologist from [place], for the pt states "I really don't care for him and his staff." Pt and wife requesting that Dr Radford Pax advise on a different Pulmonologist, if possible.  Noted that the pt is on asthma meds daily, and does have a rescue inhaler on hand.  Pt also states he just started a course of steroids for his acute asthma exacerbation, last week.  Pt is aware to use rescue inhaler when needed.  Advised the pt to continue current asthma regimen, until further advised.  Advised the pt to avoid being outdoors for extended periods of time, especially when its very hot, and the air quality is poor.  Advised the pt to avoid allergens which enhance his asthma.  Informed the pt and wife that I will route  this message to Dr Radford Pax and nurse for further review and recommendation, and follow-up with the pt thereafter.  Pt and wife both verbalized understanding, agrees with this plan, and gracious for all the assistance provided.

## 2015-07-16 NOTE — Telephone Encounter (Signed)
New message      Pt has asthma. His PCP told pt to ask Dr Radford Pax to reconsider giving ramipril and metoprolol because they both can aggravate his asthma.  Please call and let her know what Dr Radford Pax says. (just a note---while talking to wife, lots of dogs were barking in the background.  She stated they had 7 dogs)

## 2015-07-17 ENCOUNTER — Encounter: Payer: Self-pay | Admitting: *Deleted

## 2015-07-17 NOTE — Telephone Encounter (Signed)
Spoke with pt and he gave verbal permission to give information to wife. Informed pt's wife that Dr. Radford Pax would like to refer pt to [name]. Advised that I would place referral and their office would contact them for appt. Wife verbalized understanding and was in agreement with this plan.

## 2015-08-14 ENCOUNTER — Other Ambulatory Visit (INDEPENDENT_AMBULATORY_CARE_PROVIDER_SITE_OTHER): Payer: Medicare Other | Admitting: *Deleted

## 2015-08-14 DIAGNOSIS — E785 Hyperlipidemia, unspecified: Secondary | ICD-10-CM | POA: Diagnosis not present

## 2015-08-14 LAB — LIPID PANEL
CHOL/HDL RATIO: 2
Cholesterol: 104 mg/dL (ref 0–200)
HDL: 53.3 mg/dL (ref >39.00)
LDL CALC: 38 mg/dL (ref 0–99)
NONHDL: 50.72
TRIGLYCERIDES: 65 mg/dL (ref 0.0–149.0)
VLDL: 13 mg/dL (ref 0.0–40.0)

## 2015-08-14 LAB — HEPATIC FUNCTION PANEL
ALBUMIN: 3.8 g/dL (ref 3.5–5.2)
ALT: 9 U/L (ref 0–53)
AST: 18 U/L (ref 0–37)
Alkaline Phosphatase: 42 U/L (ref 39–117)
Bilirubin, Direct: 0.5 mg/dL — ABNORMAL HIGH (ref 0.0–0.3)
Total Bilirubin: 1.4 mg/dL — ABNORMAL HIGH (ref 0.2–1.2)
Total Protein: 6.6 g/dL (ref 6.0–8.3)

## 2015-08-14 NOTE — Addendum Note (Signed)
Addended by: Eulis Foster on: 08/14/2015 12:08 PM   Modules accepted: Orders

## 2015-08-23 ENCOUNTER — Ambulatory Visit (INDEPENDENT_AMBULATORY_CARE_PROVIDER_SITE_OTHER): Payer: Medicare Other | Admitting: Internal Medicine

## 2015-08-23 ENCOUNTER — Telehealth: Payer: Self-pay | Admitting: Internal Medicine

## 2015-08-23 ENCOUNTER — Encounter: Payer: Self-pay | Admitting: Internal Medicine

## 2015-08-23 VITALS — BP 102/60 | HR 58 | Ht 70.5 in | Wt 254.0 lb

## 2015-08-23 DIAGNOSIS — I2510 Atherosclerotic heart disease of native coronary artery without angina pectoris: Secondary | ICD-10-CM

## 2015-08-23 DIAGNOSIS — J45909 Unspecified asthma, uncomplicated: Secondary | ICD-10-CM | POA: Insufficient documentation

## 2015-08-23 DIAGNOSIS — J45901 Unspecified asthma with (acute) exacerbation: Secondary | ICD-10-CM | POA: Insufficient documentation

## 2015-08-23 LAB — NITRIC OXIDE: NITRIC OXIDE: 34

## 2015-08-23 MED ORDER — MONTELUKAST SODIUM 10 MG PO TABS: 10.0000 mg | ORAL_TABLET | Freq: Every day | ORAL | Status: DC

## 2015-08-23 MED ORDER — MOMETASONE FURO-FORMOTEROL FUM 200-5 MCG/ACT IN AERO: 2.0000 | INHALATION_SPRAY | Freq: Two times a day (BID) | RESPIRATORY_TRACT | Status: DC

## 2015-08-23 NOTE — Addendum Note (Signed)
Addended by: Maurice March on: 08/23/2015 05:16 PM   Modules accepted: Orders

## 2015-08-23 NOTE — Telephone Encounter (Signed)
Hi [name]  Thanks for referral. I know he has CAD but any options to move to beta1specific beta blocker (coreg to bisoprolol or metoprolol ) and an ARB instead of ACE?  Thanks  Dr. Brand Males, M.D., [facility].C.P Pulmonary and Critical Care Medicine Staff Physician [place] [facility] Pager: [phone], If no answer or between  15:00h - 7:00h: call [phone]  08/23/2015 12:49 PM

## 2015-08-23 NOTE — Patient Instructions (Signed)
ICD-9-CM ICD-10-CM   1. Asthma, chronic, unspecified asthma severity, uncomplicated 005.11 M21.117    Stop Symbicort Start high-dose Dulera 2 puff 2 times daily Start montelukast 10 mg once daily at night - use the prescription to her primary care physician give you I will write to Dr. Radford Pax and see if you can have substituted for ramipril and carvedilol  Follow-up  - Full pulmonary function test in 1 month - Return to see me or my nurse practitioner in one month

## 2015-08-23 NOTE — Progress Notes (Signed)
Subjective:    Patient ID: Keith Bernard, male    DOB: 01-01-1946, 69 y.o.   MRN: [id number]  PCP Keith Boers, MD   HPI   IOV 08/23/2015  Chief Complaint  Patient presents with  . Pulmonary Consult    Pt referred by Dr. Fransico Bernard for asthma. Pt c/o DOE, prod cough with yellow mucus, wheezing.    69 year old obese male with dyslipidemia sleep apnea on CPAP followed by Keith Bernard referred for shortness of breath and asthma evaluation.  Reports insidious onset of dyspnea for the last few years. Dyspnea is a bigger component in his cough. Symptoms are associated with wheezing. Cough is mild while dyspnea is moderate. It is present for class II-III activities on exertion. Also weather changes make his dyspnea worse. Symbicort does help. He feels dyspnea is related to asthma. He is aware that he is on an ACE inhibitor and a nonspecific beta blocker carvedilol and he is concerned that these are making his "asthma worse". He believes his sleep apnea is under control but here in the office he has been extremely slow giving answers and tends to nod off a little bit. In addition is on anxiolytics and opioids. He is a poor historian. He believes that after his heart attack a few years ago he initially had some weight loss which helped his dyspnea but then after that go several pounds of weight gain which has made his dyspnea worse  Asthma control questionnaire shows a scale of 3.2 which showed suggest active asthma. Specifically he says that he wakes up a few times at night because of asthma and when he wakes up he feels that his asthma symptoms are moderate. He is moderately limited with his activities because of perceived asthma. He is dyspneic for quite a lot". He also wheezes a lot of the time and uses albuterol for rescue at least 4 times a week  Exhaled nitric oxide is 34 ppb suggesting somewhat of an active asthma. - He is somewhat noncompliant with his Symbicort. Recently primary  care physician and started Bernard on montelukast which is yet to start  Chest x-ray 04/15/2009 personally visualized: Clear lung fields   Asthma Control Panel 08/23/2015   Current Med Regimen symbicort  ACQ 5 point- 1 week. wtd avg score. <1.0 is good control 0.75-1.25 is grey zone. >1.25 poor control. Delta 0.5 is clinically meaningful 3.2  ACQ 7 point - 1 week. wtd avg score. <1.0 is good control 0.75-1.25 is grey zone. >1.25 poor control. Delta 0.5 is clinically meaningful x  ACT - a [employer] test - 4 week. Total score. Max is 25, Lower score is worse.  <19 = poor control x  FeNO ppB 34  FeV1  x  Planned intervention  for visit Start dulera + singulair       has a past medical history of Coronary artery disease; ETOH abuse; GERD (gastroesophageal reflux disease); OSA (obstructive sleep apnea); Hypertension; DJD (degenerative joint disease); Chronic pain; Depression; Dyslipidemia; and RBBB.   reports that he has never smoked. He has never used smokeless tobacco.  Past Surgical History  Procedure Laterality Date  . Coronary stent placement    . Replacement total knee      left knee    No Known Allergies  Immunization History  Administered Date(s) Administered  . Influenza Split 09/30/2014    Family History  Problem Relation Age of Onset  . Cancer Father     pancreatic   . COPD  Mother      Current outpatient prescriptions:  .  ALPRAZolam (XANAX) 1 MG tablet, Take 0.5 mg by mouth as needed for anxiety. , Disp: , Rfl:  .  aspirin 81 MG tablet, Take 81 mg by mouth daily.  , Disp: , Rfl:  .  atorvastatin (LIPITOR) 20 MG tablet, Take 1 tablet (20 mg total) by mouth daily., Disp: 30 tablet, Rfl: 5 .  carvedilol (COREG) 6.25 MG tablet, Take 6.25 mg by mouth 2 (two) times daily with a meal., Disp: , Rfl:  .  docusate sodium (COLACE) 100 MG capsule, Take 100 mg by mouth 2 (two) times daily.  , Disp: , Rfl:  .  esomeprazole (NEXIUM) 40 MG capsule, Take 40 mg by mouth daily before  breakfast.  , Disp: , Rfl:  .  fenofibrate 160 MG tablet, Take 1 tablet (160 mg total) by mouth daily. With food, Disp: 30 tablet, Rfl: 6 .  hydrochlorothiazide (HYDRODIURIL) 25 MG tablet, Take 25 mg by mouth daily.  , Disp: , Rfl:  .  methocarbamol (ROBAXIN) 500 MG tablet, Take 500 mg by mouth 4 (four) times daily., Disp: , Rfl:  .  metoprolol succinate (TOPROL-XL) 50 MG 24 hr tablet, Take 1 tablet (50 mg total) by mouth daily. Take with or immediately following a meal., Disp: 30 tablet, Rfl: 6 .  nitroGLYCERIN (NITROSTAT) 0.4 MG SL tablet, Place 0.4 mg under the tongue every 5 (five) minutes as needed.  , Disp: , Rfl:  .  oxyCODONE (ROXICODONE) 15 MG immediate release tablet, Take 15 mg by mouth every 4 (four) hours as needed.  , Disp: , Rfl:  .  PROAIR HFA 108 (90 BASE) MCG/ACT inhaler, , Disp: , Rfl:  .  ramipril (ALTACE) 5 MG capsule, TAKE 1 CAPSULE BY MOUTH ONCE A DAY, Disp: 90 capsule, Rfl: 2 .  SYMBICORT 160-4.5 MCG/ACT inhaler, , Disp: , Rfl:  .  Testosterone (ANDROGEL PUMP) 20.25 MG/ACT (1.62%) GEL, Place 2 Squirts onto the skin daily.  , Disp: , Rfl:  .  thiamine 100 MG tablet, Take 100 mg by mouth daily.  , Disp: , Rfl:  .  triamcinolone (NASACORT) 55 MCG/ACT AERO nasal inhaler, Place 2 sprays into the nose daily., Disp: , Rfl:  .  zolpidem (AMBIEN) 10 MG tablet, Take 12.5 mg by mouth at bedtime as needed for sleep. , Disp: , Rfl:      Review of Systems  Constitutional: Negative for fever and unexpected weight change.  HENT: Negative for congestion, dental problem, ear pain, nosebleeds, postnasal drip, rhinorrhea, sinus pressure, sneezing, sore throat and trouble swallowing.   Eyes: Negative for redness and itching.  Respiratory: Positive for cough and shortness of breath. Negative for chest tightness and wheezing.   Cardiovascular: Negative for palpitations and leg swelling.  Gastrointestinal: Negative for nausea and vomiting.  Genitourinary: Negative for dysuria.    Musculoskeletal: Negative for joint swelling.  Skin: Negative for rash.  Neurological: Negative for headaches.  Hematological: Does not bruise/bleed easily.  Psychiatric/Behavioral: Negative for dysphoric mood. The patient is not nervous/anxious.        Objective:   Physical Exam  Constitutional: He is oriented to person, place, and time. He appears well-developed and well-nourished. No distress.  Body mass index is 35.92 kg/(m^2).   HENT:  Head: Normocephalic and atraumatic.  Right Ear: External ear normal.  Left Ear: External ear normal.  Mouth/Throat: Oropharynx is clear and moist. No oropharyngeal exudate.  Eyes: Conjunctivae and EOM are normal. Pupils  are equal, round, and reactive to light. Right eye exhibits no discharge. Left eye exhibits no discharge. No scleral icterus.  Neck: Normal range of motion. Neck supple. No JVD present. No tracheal deviation present. No thyromegaly present.  Cardiovascular: Normal rate, regular rhythm and intact distal pulses.  Exam reveals no gallop and no friction rub.   No murmur heard. Pulmonary/Chest: Effort normal and breath sounds normal. No respiratory distress. He has no wheezes. He has no rales. He exhibits no tenderness.  Abdominal: Soft. Bowel sounds are normal. He exhibits no distension and no mass. There is no tenderness. There is no rebound and no guarding.  Musculoskeletal: Normal range of motion. He exhibits no edema or tenderness.  Has cance Rt foot in brace due to foot drop  Lymphadenopathy:    He has no cervical adenopathy.  Neurological: He is alert and oriented to person, place, and time. He has normal reflexes. No cranial nerve deficit. Coordination normal.  Skin: Skin is warm and dry. No rash noted. He is not diaphoretic. No erythema. No pallor.  Psychiatric: Judgment normal.  Looks fatigued Slow to ansser to qustions  Nursing note and vitals reviewed.   Filed Vitals:   08/23/15 1203  BP: 102/60  Pulse: 58  Height:  5' 10.5" (1.791 m)  Weight: 254 lb (115.214 kg)  SpO2: 92%         Assessment & Plan:     ICD-9-CM ICD-10-CM   1. Asthma, chronic, unspecified asthma severity, uncomplicated 974.16 L84.536    Based on his exhaled nitric oxide I would say that his asthma is only somewhat active but his symptoms are clearly out of proportion to his exhaled nitric oxide. Asthma control question score 3.2 suggests extreme high symptom burden. Contributing to the symptoms could be his obesity, deconditioning, possible associated diastolic dysfunction. However one will have to wonder how much the ACE inhibitors a contributing to his asthma symptoms and also nonspecific beta blocker carvedilol  For now I will work on optimizing his asthma symptoms  Plan Stop Symbicort Start high-dose Dulera 2 puff 2 times daily Start montelukast 10 mg once daily at night - use the prescription to her primary care physician give you I will write to Dr. Radford Pax and see if you can have substituted for ramipril and carvedilol  Follow-up  - Full pulmonary function test in 1 month - Return to see me or my nurse practitioner in one month    Dr. Brand Males, M.D., [facility].C.P Pulmonary and Critical Care Medicine Staff Physician [place] [facility] Pager: [phone], If no answer or between  15:00h - 7:00h: call [phone]  08/23/2015 12:32 PM

## 2015-08-25 NOTE — Telephone Encounter (Signed)
Metoprolol would be fine as well.  I sent you another message stating Bystolic but metoprolol would be fine as well.

## 2015-08-26 NOTE — Telephone Encounter (Signed)
Would start with Toprol XL 25mg  daily or lopressor 25mg  BID and change ACE I to Losartan 25mg  daily.

## 2015-08-26 NOTE — Telephone Encounter (Signed)
Thanks.  What dose of lopressor?  What about his ACE - can that go to an ARB?

## 2015-08-27 ENCOUNTER — Telehealth: Payer: Self-pay | Admitting: *Deleted

## 2015-08-27 NOTE — Telephone Encounter (Signed)
Initiated PA thru Cover My Meds for Dulera 200. Key: [place] for review. Will await response.

## 2015-08-27 NOTE — Telephone Encounter (Signed)
Elise  pls have patient change coreg to lopressor 25mg  bid and ace inhibitor to losartan 25mg  daily. Let them know this is after d/w cards. You can do 30d supply and 2 refills. After that refill with cards  Thanks  Dr. Brand Males, M.D., [facility].C.P Pulmonary and Critical Care Medicine Staff Physician [place] [facility] Pager: [phone], If no answer or between  15:00h - 7:00h: call [phone]  08/27/2015 8:23 AM

## 2015-08-28 MED ORDER — METOPROLOL TARTRATE 25 MG PO TABS: 25.0000 mg | ORAL_TABLET | Freq: Two times a day (BID) | ORAL | Status: DC

## 2015-08-28 MED ORDER — LOSARTAN POTASSIUM 25 MG PO TABS: 25.0000 mg | ORAL_TABLET | Freq: Every day | ORAL | Status: DC

## 2015-08-28 NOTE — Telephone Encounter (Signed)
Received an Approval for [facility] 200-63mcg. Valid from 08/27/15 to 08/26/16.   [id number] Pharmacy informed.

## 2015-08-28 NOTE — Telephone Encounter (Signed)
Spoke with pt and is aware of below. He will stop those medications and new RX's have been [facility] per pt request. Nothing further needed

## 2015-08-29 ENCOUNTER — Telehealth: Payer: Self-pay | Admitting: Cardiology

## 2015-08-29 ENCOUNTER — Telehealth: Payer: Self-pay | Admitting: Internal Medicine

## 2015-08-29 NOTE — Telephone Encounter (Signed)
Follow up      Pt saw his pulmonary doctor and they believe one of his medications is making him have lung infections. But, they do not know which one to stop.  Please call

## 2015-08-29 NOTE — Telephone Encounter (Signed)
Called and spoke with pt Pt wanted to make sure that he was suppose to take 2 new BP medications and wanted to know again which ones he was suppose to stop Informed pt that new medications were lopressor and losartan and coreg and ramapril were discontinued Pt voiced understanding of new instructions  Nothing further is needed at this time

## 2015-08-29 NOTE — Telephone Encounter (Signed)
Per [name], instructed patient to STOP COREG and START LOPRESSOR 25 mg BID. Informed patient that it appears [name] wants patient to stop ramipril and start Losartan, but the medication list has not been updated. Instructed patient to call Dr. Golden Pop office for clarification. Patient grateful for call and agrees with treatment plan.

## 2015-08-29 NOTE — Telephone Encounter (Signed)
[phone], pt cb

## 2015-08-29 NOTE — Telephone Encounter (Signed)
New Message  Pt requested to speak w/ RN concerning med issue. Pt stated he does not know the name of the medicine that is giving him breathing trouble. Pt also stated he does not know which Rx to stop taking.  Please call back and discuss

## 2015-08-29 NOTE — Telephone Encounter (Signed)
[id number] x1 

## 2015-09-24 ENCOUNTER — Telehealth: Payer: Self-pay | Admitting: Adult Health

## 2015-09-24 NOTE — Telephone Encounter (Signed)
Spoke with spouse. She is needing [place] to take off ramipril off list so they have up to date chart of meds in [facility]. I have done so. Nothing further needed

## 2015-09-25 ENCOUNTER — Ambulatory Visit: Payer: Medicare Other | Admitting: Adult Health

## 2015-10-04 ENCOUNTER — Ambulatory Visit: Payer: Medicare Other | Admitting: Adult Health

## 2015-10-17 ENCOUNTER — Ambulatory Visit (INDEPENDENT_AMBULATORY_CARE_PROVIDER_SITE_OTHER): Payer: Medicare Other | Admitting: Internal Medicine

## 2015-10-17 DIAGNOSIS — J45909 Unspecified asthma, uncomplicated: Secondary | ICD-10-CM

## 2015-10-17 LAB — PULMONARY FUNCTION TEST
DL/VA % pred: 121 %
DL/VA: 5.43 ml/min/mmHg/L
DLCO UNC % PRED: 66 %
DLCO UNC: 19.62 ml/min/mmHg
FEF 25-75 POST: 2.11 L/s
FEF 25-75 Pre: 1.21 L/sec
FEF2575-%CHANGE-POST: 74 %
FEF2575-%PRED-POST: 92 %
FEF2575-%PRED-PRE: 52 %
FEV1-%Change-Post: 12 %
FEV1-%Pred-Post: 62 %
FEV1-%Pred-Pre: 55 %
FEV1-POST: 1.88 L
FEV1-Pre: 1.66 L
FEV1FVC-%CHANGE-POST: 7 %
FEV1FVC-%Pred-Pre: 101 %
FEV6-%Change-Post: 6 %
FEV6-%Pred-Post: 60 %
FEV6-%Pred-Pre: 56 %
FEV6-Post: 2.32 L
FEV6-Pre: 2.19 L
FEV6FVC-%Change-Post: 0 %
FEV6FVC-%Pred-Post: 105 %
FEV6FVC-%Pred-Pre: 104 %
FVC-%CHANGE-POST: 5 %
FVC-%PRED-POST: 57 %
FVC-%PRED-PRE: 54 %
FVC-POST: 2.34 L
FVC-PRE: 2.22 L
POST FEV1/FVC RATIO: 80 %
POST FEV6/FVC RATIO: 99 %
PRE FEV1/FVC RATIO: 75 %
Pre FEV6/FVC Ratio: 99 %
RV % pred: 98 %
RV: 2.31 L
TLC % pred: 69 %
TLC: 4.62 L

## 2015-10-17 NOTE — Progress Notes (Signed)
PFT done today. 

## 2015-10-28 ENCOUNTER — Other Ambulatory Visit: Payer: Self-pay | Admitting: *Deleted

## 2015-10-28 ENCOUNTER — Ambulatory Visit (INDEPENDENT_AMBULATORY_CARE_PROVIDER_SITE_OTHER): Payer: Medicare Other | Admitting: Adult Health

## 2015-10-28 ENCOUNTER — Encounter: Payer: Self-pay | Admitting: Adult Health

## 2015-10-28 VITALS — BP 120/72 | HR 55 | Temp 98.7°F | Ht 68.0 in | Wt 255.0 lb

## 2015-10-28 DIAGNOSIS — I2510 Atherosclerotic heart disease of native coronary artery without angina pectoris: Secondary | ICD-10-CM

## 2015-10-28 DIAGNOSIS — J4530 Mild persistent asthma, uncomplicated: Secondary | ICD-10-CM | POA: Diagnosis not present

## 2015-10-28 DIAGNOSIS — G4733 Obstructive sleep apnea (adult) (pediatric): Secondary | ICD-10-CM

## 2015-10-28 LAB — NITRIC OXIDE: NITRIC OXIDE: 24

## 2015-10-28 NOTE — Patient Instructions (Signed)
Continue on Dulera 2 puffs Twice daily  , rinse after use.  Continue on Singulair daily  follow up [name] in 3-4 months and As needed

## 2015-10-28 NOTE — Assessment & Plan Note (Signed)
Improved control on Dulera and singulair  Also improved off ACE and Non Selective Beta Blocker  Plan  Continue on Dulera 2 puffs Twice daily  , rinse after use.  Continue on Singulair daily  follow up [name] in 3-4 months and As needed

## 2015-10-28 NOTE — Progress Notes (Signed)
Subjective:    Patient ID: Keith Bernard, male    DOB: 13-Oct-1946, 69 y.o.   MRN: [id number]  HPI 69 year old never smoker male seen for initial pulmonary consult 08/23/2015 for chronic asthma   10/28/2015 follow-up asthma Returns for a two-month follow-up. Seen last ov for pulmonary consult for asthma. Was having cough and dyspnea.  Patient was on ACE inhibitor and Coreg. Was recommended to change this to ARB and selective beta blocker. He was changed to Lopressor and Cozaar.Marland Kitchen FENO testing was 34 last ov.  PFTs done on November 17 showed an FEV1 at 62%, ratio 80, FVC 57%, positive bronchodilator response, DLCO 66% He was changed over to [facility] 2 puffs twice daily and started on Singulair.Marland Kitchen He is feeling better. Has less cough and wheezing . Does feel less dyspnea.  FENO today shows 24.    Past Medical History  Diagnosis Date  . 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 ETOH abuse   . GERD (gastroesophageal reflux disease)   . OSA (obstructive sleep apnea)     Moderate OSA w AHI 17.7/hr now on VPAP at EEP at 7cm H2O, min PS 3cm H2O,max PS 15cm H2O  . Hypertension   . DJD (degenerative joint disease)   . Chronic pain   . Depression   . Dyslipidemia   . RBBB    Current Outpatient Prescriptions on File Prior to Visit  Medication Sig Dispense Refill  . ALPRAZolam (XANAX) 1 MG tablet Take 0.5 mg by mouth as needed for anxiety.     Marland Kitchen aspirin 81 MG tablet Take 81 mg by mouth daily.      Marland Kitchen atorvastatin (LIPITOR) 20 MG tablet Take 1 tablet (20 mg total) by mouth daily. 30 tablet 5  . docusate sodium (COLACE) 100 MG capsule Take 100 mg by mouth 2 (two) times daily.      Marland Kitchen esomeprazole (NEXIUM) 40 MG capsule Take 40 mg by mouth daily before breakfast.      . fenofibrate 160 MG tablet Take 1 tablet (160 mg total) by mouth  daily. With food 30 tablet 6  . hydrochlorothiazide (HYDRODIURIL) 25 MG tablet Take 25 mg by mouth daily.      Marland Kitchen losartan (COZAAR) 25 MG tablet Take 1 tablet (25 mg total) by mouth daily. 30 tablet 2  . methocarbamol (ROBAXIN) 500 MG tablet Take 500 mg by mouth 4 (four) times daily.    . metoprolol tartrate (LOPRESSOR) 25 MG tablet Take 1 tablet (25 mg total) by mouth 2 (two) times daily. 60 tablet 2  . mometasone-formoterol (DULERA) 200-5 MCG/ACT AERO Inhale 2 puffs into the lungs 2 (two) times daily. 1 Inhaler 5  . montelukast (SINGULAIR) 10 MG tablet Take 1 tablet (10 mg total) by mouth at bedtime. 30 tablet 11  . nitroGLYCERIN (NITROSTAT) 0.4 MG SL tablet Place 0.4 mg under the tongue every 5 (five) minutes as needed.      Marland Kitchen oxyCODONE (ROXICODONE) 15 MG immediate release tablet Take 15 mg by mouth every 4 (four) hours as needed.      Marland Kitchen PROAIR HFA 108 (90 BASE) MCG/ACT inhaler     . Testosterone (ANDROGEL PUMP) 20.25 MG/ACT (1.62%) GEL Place 2 Squirts onto the skin daily.      Marland Kitchen thiamine 100 MG tablet  Take 100 mg by mouth daily.      Marland Kitchen triamcinolone (NASACORT) 55 MCG/ACT AERO nasal inhaler Place 2 sprays into the nose daily.    Marland Kitchen zolpidem (AMBIEN) 10 MG tablet Take 12.5 mg by mouth at bedtime as needed for sleep.      No current facility-administered medications on file prior to visit.     Review of Systems Constitutional:   No  weight loss, night sweats,  Fevers, chills, + fatigue, or  lassitude.  HEENT:   No headaches,  Difficulty swallowing,  Tooth/dental problems, or  Sore throat,                No sneezing, itching, ear ache, nasal congestion, post nasal drip,   CV:  No chest pain,  Orthopnea, PND, swelling in lower extremities, anasarca, dizziness, palpitations, syncope.   GI  No heartburn, indigestion, abdominal pain, nausea, vomiting, diarrhea, change in bowel habits, loss of appetite, bloody stools.   Resp:   No chest wall deformity  Skin: no rash or lesions.  GU: no  dysuria, change in color of urine, no urgency or frequency.  No flank pain, no hematuria   MS:  No joint pain or swelling.  No decreased range of motion.  No back pain.  Psych:  No change in mood or affect. No depression or anxiety.  No memory loss.         Objective:   Physical Exam  GEN: A/Ox3; pleasant , NAD, obese   HEENT:  [place]/AT,  EACs-clear, TMs-wnl, NOSE-clear, THROAT-clear, no lesions, no postnasal drip or exudate noted.   NECK:  Supple w/ fair ROM; no JVD; normal carotid impulses w/o bruits; no thyromegaly or nodules palpated; no lymphadenopathy.  RESP  Decreased BS in bases , no accessory muscle use, no dullness to percussion  CARD:  RRR, no m/r/g  , no peripheral edema, pulses intact, no cyanosis or clubbing.  GI:   Soft & nt; nml bowel sounds; no organomegaly or masses detected.  Musco: Warm bil, no deformities or joint swelling noted. Brace on left leg   Neuro: alert, no focal deficits noted.    Skin: Warm, no lesions or rashes        Assessment & Plan:

## 2015-10-29 NOTE — Progress Notes (Signed)
Cardiology Office Note   Date:  10/29/2015   ID:  Keith Bernard, DOB [birth year], MRN [id number]   Patient Care Team: [name], MD as PCP - General (Family Medicine) [name], MD as Consulting Physician (Cardiology)    Chief Complaint  Patient presents with  . Follow-up  . Coronary Artery Disease     History of Present Illness: Keith Bernard is a 69 y.o. male with a hx of    Studies/Reports Reviewed Today:  Myoview 4/13 Normal perfusion, no ischemia, EF 71%  Echo 6/10 No RWMA, EF 99991111, normal diastolic function   Past Medical History  Diagnosis Date  . 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 ETOH abuse   . GERD (gastroesophageal reflux disease)   . OSA (obstructive sleep apnea)     Moderate OSA w AHI 17.7/hr now on VPAP at EEP at 7cm H2O, min PS 3cm H2O,max PS 15cm H2O  . Hypertension   . DJD (degenerative joint disease)   . Chronic pain   . Depression   . Dyslipidemia   . RBBB     Past Surgical History  Procedure Laterality Date  . Coronary stent placement    . Replacement total knee      left knee     Current Outpatient Prescriptions  Medication Sig Dispense Refill  . ALPRAZolam (XANAX) 1 MG tablet Take 0.5 mg by mouth as needed for anxiety.     Marland Kitchen aspirin 81 MG tablet Take 81 mg by mouth daily.      Marland Kitchen atorvastatin (LIPITOR) 20 MG tablet Take 1 tablet (20 mg total) by mouth daily. 30 tablet 5  . docusate sodium (COLACE) 100 MG capsule Take 100 mg by mouth 2 (two) times daily.      Marland Kitchen esomeprazole (NEXIUM) 40 MG capsule Take 40 mg by mouth daily before breakfast.      . fenofibrate 160 MG tablet Take 1 tablet (160 mg total) by mouth daily. With food 30 tablet 6  . hydrochlorothiazide (HYDRODIURIL) 25 MG tablet Take 25 mg by mouth daily.      Marland Kitchen losartan (COZAAR) 25 MG tablet  Take 1 tablet (25 mg total) by mouth daily. 30 tablet 2  . methocarbamol (ROBAXIN) 500 MG tablet Take 500 mg by mouth 4 (four) times daily.    . metoprolol tartrate (LOPRESSOR) 25 MG tablet Take 1 tablet (25 mg total) by mouth 2 (two) times daily. 60 tablet 2  . mometasone-formoterol (DULERA) 200-5 MCG/ACT AERO Inhale 2 puffs into the lungs 2 (two) times daily. 1 Inhaler 5  . montelukast (SINGULAIR) 10 MG tablet Take 1 tablet (10 mg total) by mouth at bedtime. 30 tablet 11  . nitroGLYCERIN (NITROSTAT) 0.4 MG SL tablet Place 0.4 mg under the tongue every 5 (five) minutes as needed.      Marland Kitchen oxyCODONE (ROXICODONE) 15 MG immediate release tablet Take 15 mg by mouth every 4 (four) hours as needed.      Marland Kitchen PROAIR HFA 108 (90 BASE) MCG/ACT inhaler     . Testosterone (ANDROGEL PUMP) 20.25 MG/ACT (1.62%) GEL Place 2 Squirts onto the skin daily.      Marland Kitchen thiamine 100 MG tablet Take 100 mg by mouth daily.      Marland Kitchen triamcinolone (NASACORT) 55 MCG/ACT  AERO nasal inhaler Place 2 sprays into the nose daily.    Marland Kitchen zolpidem (AMBIEN) 10 MG tablet Take 12.5 mg by mouth at bedtime as needed for sleep.      No current facility-administered medications for this visit.    Allergies:   Review of patient's allergies indicates no known allergies.    Social History:   Social History   Social History  . Marital Status: Married    Spouse Name: N/A  . Number of Children: N/A  . Years of Education: N/A   Occupational History  . transportation sales    Social History Main Topics  . Smoking status: Never Smoker   . Smokeless tobacco: Never Used  . Alcohol Use: 0.0 oz/week    0 Standard drinks or equivalent per week     Comment: Moderate, beer  . Drug Use: No  . Sexual Activity: Not on file   Other Topics Concern  . Not on file   Social History Narrative     Family History:   Family History  Problem Relation Age of Onset  . Cancer Father     pancreatic   . COPD Mother       ROS:   Please see the  history of present illness.   ROS    PHYSICAL EXAM: VS:  There were no vitals taken for this visit.    Wt Readings from Last 3 Encounters:  10/28/15 255 lb (115.667 kg)  08/23/15 254 lb (115.214 kg)  05/02/15 236 lb 1.9 oz (107.103 kg)     GEN: Well nourished, well developed, in no acute distress HEENT: normal Neck: no JVD, no carotid bruits, no masses Cardiac:  Normal S1/S2, RRR; no murmur ,  no rubs or gallops, no edema  Respiratory:  clear to auscultation bilaterally, no wheezing, rhonchi or rales. GI: soft, nontender, nondistended, + BS MS: no deformity or atrophy Skin: warm and dry  Neuro:  CNs II-XII intact, Strength and sensation are intact Psych: Normal affect   EKG:  EKG is ordered today.  It demonstrates:      Recent Labs: 08/14/2015: ALT 9    Lipid Panel    Component Value Date/Time   CHOL 104 08/14/2015 1208   TRIG 65.0 08/14/2015 1208   HDL 53.30 08/14/2015 1208   CHOLHDL 2 08/14/2015 1208   VLDL 13.0 08/14/2015 1208   LDLCALC 38 08/14/2015 1208   LDLDIRECT 33.0 05/07/2015 1233      ASSESSMENT AND PLAN:      Medication Changes: Current medicines are reviewed at length with the patient today.  Concerns regarding medicines are as outlined above.  The following changes have been made:   Discontinued Medications   No medications on file   Modified Medications   No medications on file   New Prescriptions   No medications on file   Labs/ tests ordered today include:   No orders of the defined types were placed in this encounter.     Disposition:    FU with    Signed, Versie Starks, MHS 10/29/2015 10:23 PM    [facility] [place], [place], [place]  [address] Phone: [phone]; Fax: [phone]    This encounter was created in error - please disregard.

## 2015-10-30 ENCOUNTER — Encounter: Payer: Medicare Other | Admitting: Physician Assistant

## 2015-10-30 NOTE — Progress Notes (Signed)
Cardiology Office Note   Date:  10/31/2015   ID:  Keith Bernard, DOB 01/01/1946, MRN [id number]   Patient Care Team: [name], MD as PCP - General (Family Medicine) [name], MD as Consulting Physician (Cardiology)    Chief Complaint  Patient presents with  . Follow-up  . Coronary Artery Disease     History of Present Illness: Keith Bernard is a 69 y.o. male with a hx of CAD status post anterior STEMI in 2010 treated with BMS to the LAD, dilated cardiomyopathy with previous EF 35% (records indicate ischemic cardiomyopathy with component of alcohol-induced cardiomyopathy), HTN, HL, OSA. Last echocardiogram in 2010 demonstrated normal LV function. Nuclear study in 2013 demonstrated normal perfusion and normal LV function. Last seen by Dr. Radford Pax 6/16. Returns for follow-up.  He is doing well. His pulmonary medications were recently adjusted. His breathing is better. He is compliant with his BiPAP. He denies chest discomfort. He denies syncope. He has mild pedal edema without significant change. 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 He does not smoke.   Studies/Reports Reviewed Today:  Myoview 4/13 Normal perfusion, no ischemia, EF 71%  Echo 6/10 No RWMA, EF 99991111, normal diastolic function  LHC 0000000 LAD: Mid 100% RCA: Proximal 20%, mid 30%, distal 15% EF 35% PCI: 3.5 x 23 mm vision BMS to the LAD   Past Medical History  Diagnosis Date  . 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 ETOH abuse   . GERD (gastroesophageal reflux disease)   . OSA (obstructive sleep apnea)     Moderate OSA w AHI 17.7/hr now on VPAP at EEP at 7cm H2O, min PS 3cm H2O,max PS 15cm H2O  . Hypertension   . DJD (degenerative joint disease)   . Chronic pain    . Depression   . Dyslipidemia   . RBBB     Past Surgical History  Procedure Laterality Date  . Coronary stent placement    . Replacement total knee      left knee     Current Outpatient Prescriptions  Medication Sig Dispense Refill  . ALPRAZolam (XANAX) 1 MG tablet Take 0.5 mg by mouth as needed for anxiety.     Marland Kitchen aspirin 81 MG tablet Take 81 mg by mouth daily.      Marland Kitchen atorvastatin (LIPITOR) 20 MG tablet Take 1 tablet (20 mg total) by mouth daily. 30 tablet 5  . docusate sodium (COLACE) 100 MG capsule Take 100 mg by mouth 2 (two) times daily.      Marland Kitchen esomeprazole (NEXIUM) 40 MG capsule Take 40 mg by mouth daily before breakfast.      . fenofibrate 160 MG tablet Take 1 tablet (160 mg total) by mouth daily. With food 30 tablet 6  . hydrochlorothiazide (HYDRODIURIL) 25 MG tablet Take 25 mg by mouth daily.      Marland Kitchen losartan (COZAAR) 25 MG tablet Take 1 tablet (25 mg total) by mouth daily. 30 tablet 2  . methocarbamol (ROBAXIN) 500 MG tablet Take 500 mg by mouth 4 (four) times daily.    . metoprolol tartrate (LOPRESSOR) 25 MG tablet Take 1 tablet (25 mg total) by mouth 2 (two) times daily. 60 tablet 2  .  mometasone-formoterol (DULERA) 200-5 MCG/ACT AERO Inhale 2 puffs into the lungs 2 (two) times daily. 1 Inhaler 5  . montelukast (SINGULAIR) 10 MG tablet Take 1 tablet (10 mg total) by mouth at bedtime. 30 tablet 11  . nitroGLYCERIN (NITROSTAT) 0.4 MG SL tablet Place 0.4 mg under the tongue every 5 (five) minutes as needed.      Marland Kitchen oxyCODONE (ROXICODONE) 15 MG immediate release tablet Take 15 mg by mouth every 4 (four) hours as needed.      Marland Kitchen PROAIR HFA 108 (90 BASE) MCG/ACT inhaler     . Testosterone (ANDROGEL PUMP) 20.25 MG/ACT (1.62%) GEL Place 2 Squirts onto the skin daily.      Marland Kitchen thiamine 100 MG tablet Take 100 mg by mouth daily.      Marland Kitchen triamcinolone (NASACORT) 55 MCG/ACT AERO nasal inhaler Place 2 sprays into the nose daily.    Marland Kitchen zolpidem (AMBIEN) 10 MG tablet Take 12.5 mg by mouth at  bedtime as needed for sleep.      No current facility-administered medications for this visit.    Allergies:   Review of patient's allergies indicates no known allergies.    Social History:   Social History   Social History  . Marital Status: Married    Spouse Name: N/A  . Number of Children: N/A  . Years of Education: N/A   Occupational History  . transportation sales    Social History Main Topics  . Smoking status: Never Smoker   . Smokeless tobacco: Never Used  . Alcohol Use: 0.0 oz/week    0 Standard drinks or equivalent per week     Comment: Moderate, beer  . Drug Use: No  . Sexual Activity: Not Asked   Other Topics Concern  . None   Social History Narrative     Family History:   Family History  Problem Relation Age of Onset  . Cancer Father     pancreatic   . COPD Mother       ROS:   Please see the history of present illness.   Review of Systems  Respiratory: Positive for wheezing.   Gastrointestinal: Positive for constipation.  All other systems reviewed and are negative.     PHYSICAL EXAM: VS:  BP 104/62 mmHg  Pulse 65  Ht 5\' 8"  (1.727 m)  Wt 260 lb 6.4 oz (118.117 kg)  BMI 39.60 kg/m2    Wt Readings from Last 3 Encounters:  10/31/15 260 lb 6.4 oz (118.117 kg)  10/28/15 255 lb (115.667 kg)  08/23/15 254 lb (115.214 kg)     GEN: Well nourished, well developed, in no acute distress HEENT: normal Neck: no JVD, no carotid bruits, no masses Cardiac:  Normal S1/S2, RRR; no murmur ,  no rubs or gallops, trace bilateral LE edema   Respiratory:  clear to auscultation bilaterally, no wheezing, rhonchi or rales. GI: soft, nontender, nondistended, + BS MS: no deformity or atrophy Skin: warm and dry  Neuro:  CNs II-XII intact, Strength and sensation are intact Psych: Normal affect   EKG:  EKG is ordered today.  It demonstrates:   NSR, HR 65, RBBB, QTc 432 ms, no change from prior tracing   Recent Labs: 08/14/2015: ALT 9    Lipid Panel     Component Value Date/Time   CHOL 104 08/14/2015 1208   TRIG 65.0 08/14/2015 1208   HDL 53.30 08/14/2015 1208   CHOLHDL 2 08/14/2015 1208   VLDL 13.0 08/14/2015 1208   LDLCALC 38 08/14/2015  1208   LDLDIRECT 33.0 05/07/2015 1233      ASSESSMENT AND PLAN:  1. CAD: Prior MI in 2010 with stenting of the LAD. EF was down at time of MI but subsequently recovered.  No angina. Continue ASA, statin, angiotensin receptor blocker, beta-blocker.  2. HTN:  Controlled.   3. Hyperlipidemia:  Recent LDL was 38 and TGs better controlled.  Continue statin, fenofibrate.   4. OSA:  On BiPAP.  Managed by Dr. Fransico Him.  Patient requested supply refills today.      Medication Changes: Current medicines are reviewed at length with the patient today.  Concerns regarding medicines are as outlined above.  The following changes have been made:   Discontinued Medications   No medications on file   Modified Medications   No medications on file   New Prescriptions   No medications on file   Labs/ tests ordered today include:   Orders Placed This Encounter  Procedures  . EKG 12-Lead     Disposition:    FU with Dr. Fransico Him 6 mos.     Signed, Versie Starks, MHS 10/31/2015 3:13 PM    [facility] [place], [place], [place]  [address] Phone: [phone]; Fax: [phone]

## 2015-10-31 ENCOUNTER — Ambulatory Visit (INDEPENDENT_AMBULATORY_CARE_PROVIDER_SITE_OTHER): Payer: Medicare Other | Admitting: Physician Assistant

## 2015-10-31 ENCOUNTER — Encounter: Payer: Self-pay | Admitting: Physician Assistant

## 2015-10-31 VITALS — BP 104/62 | HR 65 | Ht 68.0 in | Wt 260.4 lb

## 2015-10-31 DIAGNOSIS — G4733 Obstructive sleep apnea (adult) (pediatric): Secondary | ICD-10-CM

## 2015-10-31 DIAGNOSIS — I10 Essential (primary) hypertension: Secondary | ICD-10-CM | POA: Diagnosis not present

## 2015-10-31 DIAGNOSIS — E785 Hyperlipidemia, unspecified: Secondary | ICD-10-CM | POA: Diagnosis not present

## 2015-10-31 DIAGNOSIS — I2510 Atherosclerotic heart disease of native coronary artery without angina pectoris: Secondary | ICD-10-CM | POA: Diagnosis not present

## 2015-10-31 NOTE — Patient Instructions (Signed)
Medication Instructions:  Your physician recommends that you continue on your current medications as directed. Please refer to the Current Medication list given to you today.  Labwork: NONE  Testing/Procedures: NONE  Follow-Up: Your physician wants you to follow-up in: 6 MONTHS  WITH  DR  TURNER   You will receive a reminder letter in the mail two months in advance. If you don't receive a letter, please call our office to schedule the follow-up appointment.  Any Other Special Instructions Will Be Listed Below (If Applicable).     If you need a refill on your cardiac medications before your next appointment, please call your pharmacy.   

## 2015-11-28 ENCOUNTER — Other Ambulatory Visit: Payer: Self-pay | Admitting: Internal Medicine

## 2015-11-29 ENCOUNTER — Other Ambulatory Visit: Payer: Self-pay

## 2015-11-29 MED ORDER — ATORVASTATIN CALCIUM 20 MG PO TABS: 20.0000 mg | ORAL_TABLET | Freq: Every day | ORAL | Status: DC

## 2015-12-05 ENCOUNTER — Other Ambulatory Visit: Payer: Self-pay | Admitting: Cardiology

## 2015-12-05 MED ORDER — ATORVASTATIN CALCIUM 20 MG PO TABS: 20.0000 mg | ORAL_TABLET | Freq: Every day | ORAL | Status: DC

## 2015-12-14 ENCOUNTER — Other Ambulatory Visit: Payer: Self-pay | Admitting: Internal Medicine

## 2015-12-14 ENCOUNTER — Other Ambulatory Visit: Payer: Self-pay | Admitting: Cardiology

## 2015-12-17 ENCOUNTER — Telehealth: Payer: Self-pay | Admitting: Internal Medicine

## 2015-12-17 MED ORDER — PREDNISONE 10 MG PO TABS: ORAL_TABLET | ORAL | Status: DC

## 2015-12-17 MED ORDER — CEPHALEXIN 500 MG PO CAPS: 500.0000 mg | ORAL_CAPSULE | Freq: Three times a day (TID) | ORAL | Status: DC

## 2015-12-17 NOTE — Telephone Encounter (Signed)
Pt is aware of MR's recommendations. Rxs have been sent in. Nothing further was needed.

## 2015-12-17 NOTE — Telephone Encounter (Signed)
Spoke with pt, c/o prod cough with yellow mucus, worsening SOB, wheezing X1 month. Denies fever, chest pain, chills.   Pt was given pred taper approx 1 month ago to help with s/s, it helped some but never completely went away.  States that an "8 day pred taper never is long enough".   Pt uses nebulizer meds to help with s/s.  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    MR please advise on recs.  Thanks.

## 2015-12-17 NOTE — Telephone Encounter (Signed)
Please take Take prednisone 40mg  once daily x 3 days, then 30mg  once daily x 3 days, then 20mg  once daily x 3 days, then prednisone 10mg  once daily  x 3 days and stop   Cephalexin 500mg  tid x 5 days  Dr. Brand Males, M.D., [facility].C.P Pulmonary and Critical Care Medicine Staff Physician [facility] Pager: [phone], If no answer or between  15:00h - 7:00h: call [phone]  12/17/2015 4:43 PM

## 2016-01-09 ENCOUNTER — Telehealth: Payer: Self-pay | Admitting: Internal Medicine

## 2016-01-09 NOTE — Telephone Encounter (Signed)
LMOMTCB x 1 

## 2016-01-09 NOTE — Telephone Encounter (Signed)
Pt returned call.Stanley A Dalton ° °

## 2016-01-09 NOTE — Telephone Encounter (Signed)
Called mobile and was advised to call pt on home # in chart. Called home #--spoke with pt. He has pending appt with [name] tomorrow for acute visit. He will keep pending appt. Nothing further needed

## 2016-01-10 ENCOUNTER — Telehealth: Payer: Self-pay | Admitting: Internal Medicine

## 2016-01-10 ENCOUNTER — Encounter: Payer: Self-pay | Admitting: Acute Care

## 2016-01-10 ENCOUNTER — Ambulatory Visit (INDEPENDENT_AMBULATORY_CARE_PROVIDER_SITE_OTHER): Payer: Medicare Other | Admitting: Acute Care

## 2016-01-10 VITALS — BP 138/80 | HR 76 | Ht 68.0 in | Wt 262.0 lb

## 2016-01-10 DIAGNOSIS — G4733 Obstructive sleep apnea (adult) (pediatric): Secondary | ICD-10-CM

## 2016-01-10 DIAGNOSIS — R05 Cough: Secondary | ICD-10-CM | POA: Diagnosis not present

## 2016-01-10 DIAGNOSIS — J4530 Mild persistent asthma, uncomplicated: Secondary | ICD-10-CM

## 2016-01-10 DIAGNOSIS — R059 Cough, unspecified: Secondary | ICD-10-CM

## 2016-01-10 MED ORDER — PREDNISONE 10 MG PO TABS: ORAL_TABLET | ORAL | Status: DC

## 2016-01-10 MED ORDER — CEPHALEXIN 500 MG PO CAPS: 500.0000 mg | ORAL_CAPSULE | Freq: Three times a day (TID) | ORAL | Status: DC

## 2016-01-10 MED ORDER — AEROCHAMBER MV MISC: Status: AC

## 2016-01-10 NOTE — Assessment & Plan Note (Signed)
Pt. Is compliant with CPAP. Plan:  Continue on CPAP at bedtime. You appear to be benefiting from the treatment Goal is to wear for at least 4-6 hours each night for maximal clinical benefit. Continue to work on weight loss, as the link between excess weight  and sleep apnea is well established.  Do not drive if sleepy. Follow up with [name] as scheduled 01/29/2016.

## 2016-01-10 NOTE — Progress Notes (Signed)
Subjective:    Patient ID: Keith Bernard, male    DOB: 1946/03/21, 70 y.o.   MRN: [id number]  HPI  HPI 70 year old never smoker male seen for initial pulmonary consult 08/23/2015 for chronic asthma. He  was having cough and dyspnea.  Patient was on ACE inhibitor and Coreg. Was recommended to change this to ARB and selective beta blocker. He was changed to Lopressor and Cozaar. FENO testing was 34   PFTs done on November 17/2016 showed an FEV1 at 62%, ratio 80, FVC 57%, positive bronchodilator response, DLCO 66% He was started on Dulera 2 puffs twice daily and Singulair as maintenance for his asthma.   01/10/2016: Returns for Acute OV:  Patient presents to the office today with another acute flare and bronchitis of his asthma. Patient states he has shortness of breath on exertion, cough with dark brown, discolored mucus, and wheezing.  He denies fever, chest pain, hemoptysis or leg/calf pain. He was most recently treated with cephalexin 500 mg 3 times a day for 5 days treatment completed 12/23/2015. Most recent prednisone taper was completed 12/28/2015. While evaluating patient it became evident that he is not taking his Dulera as maintenance for his asthma at all. He states that he thought he was only supposed to be taking his albuterol twice daily.    Current outpatient prescriptions:  .  ALPRAZolam (XANAX) 1 MG tablet, Take 0.5 mg by mouth as needed for anxiety. , Disp: , Rfl:  .  aspirin 81 MG tablet, Take 81 mg by mouth daily.  , Disp: , Rfl:  .  atorvastatin (LIPITOR) 20 MG tablet, Take 1 tablet (20 mg total) by mouth daily., Disp: 30 tablet, Rfl: 11 .  docusate sodium (COLACE) 100 MG capsule, Take 100 mg by mouth 2 (two) times daily.  , Disp: , Rfl:  .  escitalopram (LEXAPRO) 20 MG tablet, Take 20 mg by mouth daily., Disp: , Rfl:  .  esomeprazole (NEXIUM) 40 MG capsule, Take 40 mg by mouth daily before breakfast.  , Disp: , Rfl:  .  fenofibrate 160 MG tablet, TAKE 1 TABLET  BY MOUTH DAILY WITH FOOD, Disp: 30 tablet, Rfl: 10 .  hydrochlorothiazide (HYDRODIURIL) 25 MG tablet, Take 25 mg by mouth daily.  , Disp: , Rfl:  .  losartan (COZAAR) 25 MG tablet, TAKE 1 TABLET (25 MG TOTAL) BY MOUTH DAILY., Disp: 30 tablet, Rfl: 2 .  methocarbamol (ROBAXIN) 500 MG tablet, Take 500 mg by mouth 4 (four) times daily., Disp: , Rfl:  .  metoprolol tartrate (LOPRESSOR) 25 MG tablet, Take 1 tablet (25 mg total) by mouth 2 (two) times daily., Disp: 60 tablet, Rfl: 2 .  mometasone-formoterol (DULERA) 200-5 MCG/ACT AERO, Inhale 2 puffs into the lungs 2 (two) times daily., Disp: 1 Inhaler, Rfl: 5 .  montelukast (SINGULAIR) 10 MG tablet, Take 1 tablet (10 mg total) by mouth at bedtime., Disp: 30 tablet, Rfl: 11 .  nitroGLYCERIN (NITROSTAT) 0.4 MG SL tablet, Place 0.4 mg under the tongue every 5 (five) minutes as needed.  , Disp: , Rfl:  .  oxyCODONE (ROXICODONE) 15 MG immediate release tablet, Take 15 mg by mouth every 4 (four) hours as needed.  , Disp: , Rfl:  .  PROAIR HFA 108 (90 BASE) MCG/ACT inhaler, , Disp: , Rfl:  .  Testosterone (ANDROGEL PUMP) 20.25 MG/ACT (1.62%) GEL, Place 2 Squirts onto the skin daily.  , Disp: , Rfl:  .  thiamine 100 MG tablet, Take 100 mg by  mouth daily.  , Disp: , Rfl:  .  triamcinolone (NASACORT) 55 MCG/ACT AERO nasal inhaler, Place 2 sprays into the nose daily., Disp: , Rfl:  .  zolpidem (AMBIEN) 10 MG tablet, Take 12.5 mg by mouth at bedtime as needed for sleep. , Disp: , Rfl:  .  cephALEXin (KEFLEX) 500 MG capsule, Take 1 capsule (500 mg total) by mouth 3 (three) times daily., Disp: 15 capsule, Rfl: 0 .  predniSONE (DELTASONE) 10 MG tablet, Take 4 tablets po x 2 days, then 2 x 2 days, then 1 x 2 days then stop, Disp: 14 tablet, Rfl: 0 .  Spacer/Aero-Holding Chambers (AEROCHAMBER MV) inhaler, Use as instructed, Disp: 1 each, Rfl: 0   Past Medical History  Diagnosis Date  . 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 ETOH abuse   . GERD (gastroesophageal reflux disease)   . OSA (obstructive sleep apnea)     Moderate OSA w AHI 17.7/hr now on VPAP at EEP at 7cm H2O, min PS 3cm H2O,max PS 15cm H2O  . Hypertension   . DJD (degenerative joint disease)   . Chronic pain   . Depression   . Dyslipidemia   . RBBB     No Known Allergies  Review of Systems Constitutional:   No  weight loss,+ night sweats, Denies Fevers, chills,+ fatigue, or  lassitude.  HEENT:   No headaches,  Difficulty swallowing,  Tooth/dental problems, or  Sore throat,                No sneezing, itching, ear ache, nasal congestion, post nasal drip,   CV:  No chest pain,  Orthopnea, PND, swelling in lower extremities, anasarca, dizziness, palpitations, syncope.   GI  No heartburn, indigestion, abdominal pain, nausea, vomiting, diarrhea, change in bowel habits, loss of appetite, bloody stools.   Resp: + shortness of breath with exertion not at rest.  + excess mucus, + productive cough,  No non-productive cough,  No coughing up of blood.  + change in color of mucus.  + wheezing.  No chest wall deformity  Skin: no rash or lesions.  GU: no dysuria, change in color of urine, no urgency or frequency.  No flank pain, no hematuria   MS:  No joint pain or swelling.  No decreased range of motion.  No back pain.  Psych:  No change in mood or affect. No depression or anxiety.  No memory loss.        Objective:   Physical Exam BP 138/80 mmHg  Pulse 76  Ht 5\' 8"  (1.727 m)  Wt 262 lb (118.842 kg)  BMI 39.85 kg/m2  SpO2 90%  Physical Exam:  General- No distress,  A&Ox3 ENT: No sinus tenderness, TM clear, pale nasal mucosa, no oral exudate,no post nasal drip, no LAN Cardiac: S1, S2, regular rate and rhythm, no murmur Chest: + wheeze/ no rales/ dullness;scattered rhonchi,no accessory muscle use, no nasal flaring, no  sternal retractions Abd.: Soft Non-tender Ext: No clubbing cyanosis, edema Neuro:  normal strength Skin: No rashes, warm and dry Psych: normal mood and behavior      Assessment & Plan:

## 2016-01-10 NOTE — Telephone Encounter (Signed)
Pt seen today by Eric Form, brought in paperwork for Patient Assistance on [facility] and Proair.  Forms completed and placed in Dr Chase Caller look-at  Pt requests call back once these are completed to mail a copy.  Will send to [facility] to follow up.

## 2016-01-10 NOTE — Assessment & Plan Note (Addendum)
Pt. Is not using his Dulera as maintenance for his asthma. He is not taking Dulera at all. Pt. Is only using Pro Air twice daily. Frequent slow to resolve  exacerbations of Asthma/ bronchitis, last treatment with Cephalexin completed 12-23-15 Last Prednisone taper completed 12/28/15. Presents today with wheezing/cough/SOB/ discolored mucus.  Plan: Extensive ( 15 minutes)Patient teaching done regarding the difference between maintenance medication for Asthma and rescue medication for asthma Explained in detail that [facility] is to be taken every day, without fail, 2 puffs in the morning and 2 puffs 12 hours later. Explained in detail that [employer] is his rescue medication, to be used for break through SOB/Wheezing not controlled with the [facility]. Every 4-6 hours as needed. Nursing to teach and observe proper use of spacer for inhalers with patient today. Paperwork completed for patient assistance for medications. Cephalexin 500 mg TID x 5 days Take Align or Culturelle probiotics with the antibiotics. Prednisone taper; 10 mg tablets: 4 tabs x 2 days,  2 tabs x 2 days 1 tab x 2 days then stop. Follow up with [name] as is already scheduled for 01/29/2016. Please contact office for sooner follow up if symptoms do not improve or worsen or seek emergency care

## 2016-01-10 NOTE — Patient Instructions (Addendum)
It is nice to meet you today, Remember to take your [facility] every day. Two puffs in the morning and 2 puffs 12 hours later. Daily without fail. This is your maintenance medication. I will have the nurse teach you how to use the inhaler. The [employer] is your rescue inhaler. Use this as needed for shortness of breath or wheezing every 4-6 hours. We will treat you with a short term prednisone taper Prednisone taper; 10 mg tablets: 4 tabs x 2 days,  2 tabs x 2 days 1 tab x 2 days then stop. We will treat you with Cephalexin 500mg  tid x 5 days. Take Align or Culturelle probiotics with the antibiotics. Follow up with [name] as is already scheduled for 01/29/2016. Please contact office for sooner follow up if symptoms do not improve or worsen or seek emergency care

## 2016-01-13 ENCOUNTER — Other Ambulatory Visit: Payer: Self-pay | Admitting: Internal Medicine

## 2016-01-13 MED ORDER — ALBUTEROL SULFATE HFA 108 (90 BASE) MCG/ACT IN AERS: 2.0000 | INHALATION_SPRAY | Freq: Four times a day (QID) | RESPIRATORY_TRACT | Status: DC | PRN

## 2016-01-13 NOTE — Telephone Encounter (Signed)
This has been signed, Ruthe Mannan pt assistance has been placed in outgoing mail and Proair pt assistance has been faxed. Copies have been made for MR's scan folder and another copy has been placed up front for pt to pick to keep for his records.  lmtcb for pt.

## 2016-01-13 NOTE — Telephone Encounter (Signed)
Done and signed with Daneil Dan.

## 2016-01-15 ENCOUNTER — Other Ambulatory Visit: Payer: Self-pay | Admitting: Internal Medicine

## 2016-01-15 NOTE — Telephone Encounter (Signed)
Called and spoke to pt. Informed him both pt assistance forms have been sent out and copies have been placed up front. Pt aware that Ruthe Mannan will take up to several weeks to be processed as these forms had to be mailed out. Pt aware to call back if he has not heard from either pt assistance in a few weeks. Pt verbalized understanding and denied any further questions or concerns at this time.

## 2016-01-29 ENCOUNTER — Ambulatory Visit (INDEPENDENT_AMBULATORY_CARE_PROVIDER_SITE_OTHER): Payer: Medicare Other | Admitting: Internal Medicine

## 2016-01-29 ENCOUNTER — Encounter: Payer: Self-pay | Admitting: Internal Medicine

## 2016-01-29 ENCOUNTER — Other Ambulatory Visit: Payer: Medicare Other

## 2016-01-29 VITALS — BP 128/78 | HR 63 | Ht 68.0 in | Wt 270.8 lb

## 2016-01-29 DIAGNOSIS — J4530 Mild persistent asthma, uncomplicated: Secondary | ICD-10-CM | POA: Diagnosis not present

## 2016-01-29 NOTE — Progress Notes (Addendum)
Subjective:     Patient ID: Keith Bernard, male   DOB: 04/30/1946, 70 y.o.   MRN: [id number]  HPI   OV 01/29/2016  Chief Complaint  Patient presents with  . Follow-up    Pt here after acute visit with SG on 2.10.17. Pt states his breathing has imrpoved since last OV. Pt states he has occassional wheezing and mild non prod cough. Pt denies CP/tightness.     Keith Bernard returns for chronic obstructive lung disease asthma follow-up. He is now on Dulera plus Singulair. He is compliant with Dulera. He says with improved compliance his symptoms are a lot better. His wife is here with him. Most recently he had a flareup and was treated with prednisone. I review the chart when he was seen by [name] gross my nurse practitioner. Currently he feels asthma is well controlled. However he still uses albuterol multiple times a day for rescue but denies any nocturnal symptoms or daytime symptoms. He is compliant with this CPAP for sleep apnea.  Review of the chart shows this is in December 2012 has elevated used to flows of 400 cells per cubic millimeter. I notice that and IgE was never checked. In any event subjectively is well controlled currently. Also noted on alpha-1 has not been checked.  Asthma control questionnaire shows that at night he hardly ever wakes up because of asthma. When he wakes up he has very mild symptoms. In the daytime he is moderately limited because of asthma although this could be because of obesity. He also has moderate shortness of breath because of asthma although this could be because of obesity. He is wheezing a lot of the time and uses albuterol 3-4 puffs on most days. Nevertheless average score of 2.2 show significant improvement after starting [facility]   Asthma Control Panel -December 2012 used to feels 400 cells per cubic millimeter \ - IgE never checked  08/23/2015  01/29/2016   Current Med Regimen symbicort  Dulera   ACQ 5 point- 1 week. wtd avg score. <1.0 is good  control 0.75-1.25 is grey zone. >1.25 poor control. Delta 0.5 is clinically meaningful 3.2 2.2  ACQ 7 point - 1 week. wtd avg score. <1.0 is good control 0.75-1.25 is grey zone. >1.25 poor control. Delta 0.5 is clinically meaningful x   ACT - a GSK test - 4 week. Total score. Max is 25, Lower score is worse.  <19 = poor control x   FeNO ppB 34 x  FeV1  x   Planned intervention  for visit Start dulera + singulair Cont dulera        has a past medical history of Coronary artery disease; ETOH abuse; GERD (gastroesophageal reflux disease); OSA (obstructive sleep apnea); Hypertension; DJD (degenerative joint disease); Chronic pain; Depression; Dyslipidemia; and RBBB.   reports that he has never smoked. He has never used smokeless tobacco.  Past Surgical History  Procedure Laterality Date  . Coronary stent placement    . Replacement total knee      left knee    No Known Allergies  Immunization History  Administered Date(s) Administered  . Influenza Split 09/30/2014  . Influenza,inj,Quad PF,36+ Mos 10/23/2015  . Pneumococcal Conjugate-13 10/01/2015    Family History  Problem Relation Age of Onset  . Cancer Father     pancreatic   . COPD Mother      Current outpatient prescriptions:  .  albuterol (PROAIR HFA) 108 (90 Base) MCG/ACT inhaler, Inhale 2 puffs into the  lungs every 6 (six) hours as needed for wheezing or shortness of breath., Disp: 1 Inhaler, Rfl: 11 .  ALPRAZolam (XANAX) 1 MG tablet, Take 0.5 mg by mouth as needed for anxiety. , Disp: , Rfl:  .  aspirin 81 MG tablet, Take 81 mg by mouth daily.  , Disp: , Rfl:  .  atorvastatin (LIPITOR) 20 MG tablet, Take 1 tablet (20 mg total) by mouth daily., Disp: 30 tablet, Rfl: 11 .  docusate sodium (COLACE) 100 MG capsule, Take 100 mg by mouth 2 (two) times daily.  , Disp: , Rfl:  .  escitalopram (LEXAPRO) 20 MG tablet, Take 20 mg by mouth daily., Disp: , Rfl:  .  esomeprazole (NEXIUM) 40 MG capsule, Take 40 mg by mouth daily  before breakfast.  , Disp: , Rfl:  .  fenofibrate 160 MG tablet, TAKE 1 TABLET BY MOUTH DAILY WITH FOOD, Disp: 30 tablet, Rfl: 10 .  hydrochlorothiazide (HYDRODIURIL) 25 MG tablet, Take 25 mg by mouth daily.  , Disp: , Rfl:  .  losartan (COZAAR) 25 MG tablet, TAKE 1 TABLET (25 MG TOTAL) BY MOUTH DAILY., Disp: 30 tablet, Rfl: 2 .  metoprolol tartrate (LOPRESSOR) 25 MG tablet, TAKE 1 TABLET BY MOUTH TWICE A DAY, Disp: 60 tablet, Rfl: 2 .  mometasone-formoterol (DULERA) 200-5 MCG/ACT AERO, Inhale 2 puffs into the lungs 2 (two) times daily., Disp: 1 Inhaler, Rfl: 5 .  montelukast (SINGULAIR) 10 MG tablet, Take 1 tablet (10 mg total) by mouth at bedtime., Disp: 30 tablet, Rfl: 11 .  nitroGLYCERIN (NITROSTAT) 0.4 MG SL tablet, Place 0.4 mg under the tongue every 5 (five) minutes as needed.  , Disp: , Rfl:  .  oxyCODONE (ROXICODONE) 15 MG immediate release tablet, Take 15 mg by mouth every 4 (four) hours as needed.  , Disp: , Rfl:  .  Spacer/Aero-Holding Chambers (AEROCHAMBER MV) inhaler, Use as instructed, Disp: 1 each, Rfl: 0 .  Testosterone (ANDROGEL PUMP) 20.25 MG/ACT (1.62%) GEL, Place 2 Squirts onto the skin daily.  , Disp: , Rfl:  .  thiamine 100 MG tablet, Take 100 mg by mouth daily.  , Disp: , Rfl:  .  zolpidem (AMBIEN) 10 MG tablet, Take 12.5 mg by mouth at bedtime as needed for sleep. , Disp: , Rfl:       Review of Systems     Objective:   Physical Exam  Constitutional: He is oriented to person, place, and time. He appears well-developed and well-nourished. No distress.  Body mass index is 41.18 kg/(m^2).   HENT:  Head: Normocephalic and atraumatic.  Right Ear: External ear normal.  Left Ear: External ear normal.  Mouth/Throat: Oropharynx is clear and moist. No oropharyngeal exudate.  mallampatti class 3  Eyes: Conjunctivae and EOM are normal. Pupils are equal, round, and reactive to light. Right eye exhibits no discharge. Left eye exhibits no discharge. No scleral icterus.   Neck: Normal range of motion. Neck supple. No JVD present. No tracheal deviation present. No thyromegaly present.  Cardiovascular: Normal rate, regular rhythm and intact distal pulses.  Exam reveals no gallop and no friction rub.   No murmur heard. Pulmonary/Chest: Effort normal and breath sounds normal. No respiratory distress. He has no wheezes. He has no rales. He exhibits no tenderness.  Abdominal: Soft. Bowel sounds are normal. He exhibits no distension and no mass. There is no tenderness. There is no rebound and no guarding.  Visceral obesity +  Musculoskeletal: Normal range of motion. He exhibits no  edema or tenderness.  Lymphadenopathy:    He has no cervical adenopathy.  Neurological: He is alert and oriented to person, place, and time. He has normal reflexes. No cranial nerve deficit. Coordination normal.  Skin: Skin is warm and dry. No rash noted. He is not diaphoretic. No erythema. No pallor.  Psychiatric: He has a normal mood and affect. His behavior is normal. Judgment and thought content normal.  Nursing note and vitals reviewed.   Filed Vitals:   01/29/16 1404  BP: 128/78  Pulse: 63  Height: 5\' 8"  (1.727 m)  Weight: 270 lb 12.8 oz (122.834 kg)  SpO2: 92%          Assessment:       ICD-9-CM ICD-10-CM   1. Asthma, chronic, mild persistent, uncomplicated 123456 [id number] Alpha-1 antitrypsin phenotype       Plan:      Asthma appears reasonably controlled though he still symptomatic and he could be an over perceiver  Plan - Do alpha 1 genetic test for family connections with obstructive lung disease - Continue Dulera 2 puff 2 times daily at the current dose - Use albuterol for rescue - Complete asthma control questionnaire before you leave - We will monitor your asthma over the rest of 2017 and if you're having problems we will do additional tests for asthma complicating diseases  Follow-up - 3 months or sooner if needed - asthma control questionnaire and  exhaled nitric oxide test at follow-up. If still symptomatic then check IgE, allergy profile, eos   Dr. Brand Males, M.D., [facility].C.P Pulmonary and Critical Care Medicine Staff Physician [place] [facility] Pager: [phone], If no answer or between  15:00h - 7:00h: call [phone]  01/29/2016 2:25 PM

## 2016-01-29 NOTE — Patient Instructions (Addendum)
    ICD-9-CM ICD-10-CM   1. Asthma, chronic, mild persistent, uncomplicated 123456 [id number]     Asthma appears reasonably controlled  Plan - Do alpha 1 genetic test for family connections with obstructive lung disease - Continue Dulera 2 puff 2 times daily at the current dose - Use albuterol for rescue - Complete asthma control questionnaire before you leave - We will monitor your asthma over the rest of 2017 and if you're having problems we will do additional tests for asthma complicating diseases  Follow-up - 3 months or sooner if needed - asthma control questionnaire and exhaled nitric oxide test at follow-up.

## 2016-02-04 LAB — ALPHA-1 ANTITRYPSIN PHENOTYPE: A-1 Antitrypsin: 155 mg/dL (ref 83–199)

## 2016-02-05 NOTE — Progress Notes (Signed)
Quick Note:  Called and spoke to pt. Informed him of the results and recs per MR. Pt verbalized understanding and denied any further questions or concerns at this time.   ______ 

## 2016-02-07 ENCOUNTER — Telehealth: Payer: Self-pay | Admitting: Internal Medicine

## 2016-02-07 NOTE — Telephone Encounter (Signed)
Spoke with pt wife, states that the form was incomplete with [employer] Patient Assistance - missing information regarding the patient and also missing Dr's information.  Wife states that she was advised by a [employer] Rep that they were sending the forms back to [place] to complete. States that they mailed this 02/03/16. Will send to [facility] to keep an eye out.

## 2016-02-10 NOTE — Telephone Encounter (Signed)
Daneil Dan, have you received the forms yet? thanks

## 2016-02-10 NOTE — Telephone Encounter (Signed)
No I have not received this yet.

## 2016-02-13 NOTE — Telephone Encounter (Signed)
Patient wife is aware that we have not received anything for [employer]. Pt wife to call them back and will let us know once she figures out what is going on. Will send back to [place] to follow-up

## 2016-02-13 NOTE — Telephone Encounter (Signed)
Pt wife call back about this please advise.Hillery Hunter

## 2016-02-13 NOTE — Telephone Encounter (Signed)
Called and spoke with pt's wife. She states she spoke with the [employer]. She states that they informed her that [employer] mailed the forms back to our office so we should be receiving them soon. I explained to her that we have not received the forms as of yet but I would send a message to [facility] for follow up. She voiced understanding and had no further questions.

## 2016-02-14 ENCOUNTER — Telehealth: Payer: Self-pay

## 2016-02-14 NOTE — Telephone Encounter (Signed)
Tier exception for Fenofibrate 160mg  sent to [facility].

## 2016-02-17 ENCOUNTER — Telehealth: Payer: Self-pay | Admitting: *Deleted

## 2016-02-17 NOTE — Telephone Encounter (Signed)
PA faxed back to [employer] for [place]. [phone] Fax # [phone].  Will await response.

## 2016-02-17 NOTE — Telephone Encounter (Addendum)
Still have not received the mailed form from [place], but I have received a form for tier cost sharing request form for pt's Dulera. This has been filled out and faxed back to [facility]. Will await a decision.

## 2016-02-19 NOTE — Telephone Encounter (Signed)
Patient wife states that we should not have done a PA for the [facility] - aware that we automatically do a PA for a medication that is denied by insurance unless told otherwise by the MD. Pt wife aware that we initiated this on 02/17/16 and are still waiting on a response. Wife states that even with insurance coverage the copay is going to be too much and they are trying to get Patient Assistance on this medication. Wife states that Patient Assistance forms for the patient's Ruthe Mannan was mailed/faxed back to our office from the company bc it was missing information Vick Frees and MD info) . Daneil Dan please advise if you have received any patient assistance forms on this patient. Thanks.

## 2016-02-19 NOTE — Telephone Encounter (Signed)
Pt wife calling stating that PA should've been mailed instead of being faxed, told her it was faxed already she insisted that it was to to be mailed please advise would also like samples of dulera and proair please advise

## 2016-02-20 NOTE — Telephone Encounter (Signed)
No, I have not received any update on this medication. [facility] number is [id number], reference # [id number].

## 2016-02-20 NOTE — Telephone Encounter (Signed)
Please confirm which dosage of breo. thanks

## 2016-02-20 NOTE — Telephone Encounter (Signed)
Low dose 

## 2016-02-20 NOTE — Telephone Encounter (Signed)
[place] for [employer]

## 2016-02-20 NOTE — Telephone Encounter (Signed)
Keith Bernard was denied. Preferred is Advair HFA, Advair Diskus, and Breo.

## 2016-02-20 NOTE — Telephone Encounter (Signed)
Keith Bernard was denied, message has been sent to MR to change to a preferred alternative. For more information see phone note from 3/20. Will sign off.

## 2016-02-20 NOTE — Telephone Encounter (Signed)
Daneil Dan please advise if you have heard anything back from [facility]. If not please attach phone number so triage may follow up. Thanks.

## 2016-02-21 NOTE — Telephone Encounter (Signed)
ATC, NA and no option to leave msg 

## 2016-02-24 MED ORDER — FLUTICASONE FUROATE-VILANTEROL 100-25 MCG/INH IN AEPB: 1.0000 | INHALATION_SPRAY | Freq: Every day | RESPIRATORY_TRACT | Status: DC

## 2016-02-24 NOTE — Telephone Encounter (Signed)
Called and spoke to pt's caregiver. Informed her of the change in medication as Keith Bernard is not covered by insurance. Informed her a sample has been left up front for pick up and an rx has been sent to [facility]. Pt verbalized understanding and denied any further questions or concerns at this time.

## 2016-02-28 ENCOUNTER — Other Ambulatory Visit: Payer: Self-pay | Admitting: Internal Medicine

## 2016-03-04 ENCOUNTER — Other Ambulatory Visit: Payer: Self-pay | Admitting: Internal Medicine

## 2016-03-30 ENCOUNTER — Telehealth: Payer: Self-pay | Admitting: Internal Medicine

## 2016-03-30 MED ORDER — FLUTICASONE FUROATE-VILANTEROL 100-25 MCG/INH IN AEPB: 1.0000 | INHALATION_SPRAY | Freq: Every day | RESPIRATORY_TRACT | Status: DC

## 2016-03-30 NOTE — Telephone Encounter (Signed)
Spoke with patient wife, requesting that we send an Rx for Breo to the local pharmacy -[facility] in [place] and then a 90-day supply to [facility].  Rx's sent to pharmacy. Nothing further needed.

## 2016-03-30 NOTE — Telephone Encounter (Signed)
Pt wife cb, states this patient is out of this medication, please advise, wife may be reached at previous number given

## 2016-05-04 ENCOUNTER — Ambulatory Visit (INDEPENDENT_AMBULATORY_CARE_PROVIDER_SITE_OTHER): Payer: Medicare Other | Admitting: Cardiology

## 2016-05-04 ENCOUNTER — Encounter: Payer: Self-pay | Admitting: Cardiology

## 2016-05-04 ENCOUNTER — Encounter: Payer: Self-pay | Admitting: Internal Medicine

## 2016-05-04 ENCOUNTER — Ambulatory Visit (INDEPENDENT_AMBULATORY_CARE_PROVIDER_SITE_OTHER): Payer: Medicare Other | Admitting: Internal Medicine

## 2016-05-04 VITALS — BP 102/62 | HR 59 | Ht 69.0 in | Wt 250.0 lb

## 2016-05-04 VITALS — BP 110/52 | HR 56 | Ht 69.0 in | Wt 251.2 lb

## 2016-05-04 DIAGNOSIS — F32A Depression, unspecified: Secondary | ICD-10-CM

## 2016-05-04 DIAGNOSIS — I10 Essential (primary) hypertension: Secondary | ICD-10-CM

## 2016-05-04 DIAGNOSIS — I2510 Atherosclerotic heart disease of native coronary artery without angina pectoris: Secondary | ICD-10-CM | POA: Diagnosis not present

## 2016-05-04 DIAGNOSIS — J4530 Mild persistent asthma, uncomplicated: Secondary | ICD-10-CM | POA: Diagnosis not present

## 2016-05-04 DIAGNOSIS — G4733 Obstructive sleep apnea (adult) (pediatric): Secondary | ICD-10-CM | POA: Diagnosis not present

## 2016-05-04 DIAGNOSIS — E669 Obesity, unspecified: Secondary | ICD-10-CM | POA: Diagnosis not present

## 2016-05-04 DIAGNOSIS — F329 Major depressive disorder, single episode, unspecified: Secondary | ICD-10-CM

## 2016-05-04 DIAGNOSIS — E785 Hyperlipidemia, unspecified: Secondary | ICD-10-CM

## 2016-05-04 HISTORY — DX: Depression, unspecified: F32.A

## 2016-05-04 LAB — NITRIC OXIDE: NITRIC OXIDE: 26

## 2016-05-04 LAB — LIPID PANEL
Cholesterol: 114 mg/dL — ABNORMAL LOW (ref 125–200)
HDL: 61 mg/dL (ref >=40)
LDL CALC: 40 mg/dL (ref <130)
Total CHOL/HDL Ratio: 1.9 Ratio (ref <=5.0)
Triglycerides: 64 mg/dL (ref <150)
VLDL: 13 mg/dL (ref <30)

## 2016-05-04 LAB — HEPATIC FUNCTION PANEL
ALBUMIN: 4.1 g/dL (ref 3.6–5.1)
ALK PHOS: 67 U/L (ref 40–115)
ALT: 12 U/L (ref 9–46)
AST: 19 U/L (ref 10–35)
BILIRUBIN INDIRECT: 0.4 mg/dL (ref 0.2–1.2)
Bilirubin, Direct: 0.4 mg/dL — ABNORMAL HIGH (ref <=0.2)
TOTAL PROTEIN: 6.6 g/dL (ref 6.1–8.1)
Total Bilirubin: 0.8 mg/dL (ref 0.2–1.2)

## 2016-05-04 LAB — BASIC METABOLIC PANEL
BUN: 26 mg/dL — ABNORMAL HIGH (ref 7–25)
CALCIUM: 9.9 mg/dL (ref 8.6–10.3)
CHLORIDE: 96 mmol/L — AB (ref 98–110)
CO2: 28 mmol/L (ref 20–31)
Creat: 1.37 mg/dL — ABNORMAL HIGH (ref 0.70–1.18)
GLUCOSE: 89 mg/dL (ref 65–99)
POTASSIUM: 4.3 mmol/L (ref 3.5–5.3)
SODIUM: 134 mmol/L — AB (ref 135–146)

## 2016-05-04 MED ORDER — MOMETASONE FURO-FORMOTEROL FUM 100-5 MCG/ACT IN AERO: 2.0000 | INHALATION_SPRAY | Freq: Two times a day (BID) | RESPIRATORY_TRACT | Status: DC

## 2016-05-04 NOTE — Progress Notes (Signed)
Subjective:     Patient ID: Keith Bernard, male   DOB: 05/05/1946, 70 y.o.   MRN: [id number]  HPI  OV 01/29/2016  Chief Complaint  Patient presents with  . Follow-up    Pt here after acute visit with SG on 2.10.17. Pt states his breathing has imrpoved since last OV. Pt states he has occassional wheezing and mild non prod cough. Pt denies CP/tightness.     Keith Bernard returns for chronic obstructive lung disease asthma follow-up. He is now on Dulera plus Singulair. He is compliant with Dulera. He says with improved compliance his symptoms are a lot better. His wife is here with him. Most recently he had a flareup and was treated with prednisone. I review the chart when he was seen by [name] gross my nurse practitioner. Currently he feels asthma is well controlled. However he still uses albuterol multiple times a day for rescue but denies any nocturnal symptoms or daytime symptoms. He is compliant with this CPAP for sleep apnea.  Review of the chart shows this is in December 2012 has elevated used to flows of 400 cells per cubic millimeter. I notice that and IgE was never checked. In any event subjectively is well controlled currently. Also noted on alpha-1 has not been checked.  Asthma control questionnaire shows that at night he hardly ever wakes up because of asthma. When he wakes up he has very mild symptoms. In the daytime he is moderately limited because of asthma although this could be because of obesity. He also has moderate shortness of breath because of asthma although this could be because of obesity. He is wheezing a lot of the time and uses albuterol 3-4 puffs on most days. Nevertheless average score of 2.2 show significant improvement after starting [place] 05/04/2016  Chief Complaint  Patient presents with  . Follow-up    Pt c/o occasional wheeze since not being on Breo for about a week. Pt has been using albuterol HFA more. Pt denies cough/SOB/CP/tightness.    Follow-up  asthma. Last seen March 2017. This is a routine follow-up. I put him on Brio when she really liked. However the cost dollars out of pocket because he is in the donut hole. So he ran out of it over a week ago and is having increased symptoms. Asthma control questionnaire is 2.0 and still within his range from prior visits. Exhaled nitric oxide is just over 25 suggests some amount of active airway inflammation. His wife is here with him and she says that she got approved for the [facility] patient assistance program but apparently between our office in the mailroom the paperwork has been loss. She is willing to redo the paperwork and she is wondering if I could sign my part of it this week.  In terms of symptoms specifically tells me that he does wake up a few times at night. When he wakes up he has very mild symptoms. Slightly limited in his activities because of asthma. He has moderate amount of shortness of breath with exertion. He does wheeze a little of the time and is using albuterol for rescue at least one or 2 times daily. 5 point score is 2/.0 showing activity - is worse this past week or so is within normal. Troponin but just over that she 13 sets [place] prescribed check   Asthma Control Panel -December 2012 used to feels 400 cells per cubic millimeter \ - IgE not checked as of 05/04/2016 - Alpha 1  is MM 08/23/2015  01/29/2016  05/04/2016   Current Med Regimen symbicort  Dulera  Off breo for several days.   ACQ 5 point- 1 week. wtd avg score. <1.0 is good control 0.75-1.25 is grey zone. >1.25 poor control. Delta 0.5 is clinically meaningful 3.2 2.2 2.0  ACQ 7 point - 1 week. wtd avg score. <1.0 is good control 0.75-1.25 is grey zone. >1.25 poor control. Delta 0.5 is clinically meaningful x    ACT - a GSK test - 4 week. Total score. Max is 25, Lower score is worse.  <19 = poor control x    FeNO ppB 34 x 26  FeV1  x  x  Planned intervention  for visit Start dulera + singulair Cont dulera       Review of Systems     Objective:   Physical Exam  Filed Vitals:   05/04/16 1357  BP: 102/62  Pulse: 59  Height: 5\' 9"  (1.753 m)  Weight: 250 lb (113.399 kg)  SpO2: 94%        Assessment:       ICD-9-CM ICD-10-CM   1. Asthma, chronic, mild persistent, uncomplicated 123456 [id number] Nitric oxide       Plan:      Asthma stable but symptoms getting worse withoiut inhalers Too bad that [employer] program paper work got lost  Plan - start dulera 100/4.5, 2 puff twice daily  - have your wife drop off paper work for [employer] 05/05/16 and I intend to sign it by end of this week - albtuterl as needed -flu shot in fall  FOllowup 5 months or sooner - feno and acq at followup   Dr. Brand Males, M.D., [facility]C.P Pulmonary and Critical Care Medicine Staff Physician [place] [facility] Pager: [phone], If no answer or between  15:00h - 7:00h: call [phone]  05/04/2016 2:17 PM

## 2016-05-04 NOTE — Progress Notes (Signed)
Cardiology Office Note    Date:  05/04/2016   ID:  SHAIKH ZOBELL, DOB Sep 16, 1946, MRN [id number]  PCP:  [name], MD  Cardiologist:  Fransico Him, MD   Chief Complaint  Patient presents with  . Coronary Artery Disease  . Hypertension  . Sleep Apnea  . Hyperlipidemia    History of Present Illness:  Keith Bernard is a 70 y.o. male with a history of OSA on CPAP therapy, ASCAD, HTN, obesity and dyslipidemia. He is doing well. He denies any chest pain, dizziness, palpitations or syncope. He has chronic SOB from asthma that is fairly stable on inhalers. He has chronic LE edema from his neuropathy but this is stable and well controlled on diuretics. He is using his CPAP without any problems. He tolerates the nasal mask with chin strap and feels the pressure is adequate. He feels rested when he gets up in the am but does nap some during the day when he sits down.    Past Medical History  Diagnosis Date  . 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 ETOH abuse   . GERD (gastroesophageal reflux disease)   . OSA (obstructive sleep apnea)     Moderate OSA w AHI 17.7/hr now on VPAP at EEP at 7cm H2O, min PS 3cm H2O,max PS 15cm H2O  . Hypertension   . DJD (degenerative joint disease)   . Chronic pain   . Depression   . Dyslipidemia   . RBBB   . Depression 05/04/2016    Past Surgical History  Procedure Laterality Date  . Coronary stent placement    . Replacement total knee      left knee    Current Medications: Outpatient Prescriptions Prior to Visit  Medication Sig Dispense Refill  . albuterol (PROAIR HFA) 108 (90 Base) MCG/ACT inhaler Inhale 2 puffs into the lungs every 6 (six) hours as needed for wheezing or shortness of breath. 1 Inhaler 11  . ALPRAZolam (XANAX) 1 MG tablet Take 0.5 mg by mouth as needed for  anxiety.     Marland Kitchen aspirin 81 MG tablet Take 81 mg by mouth daily.      Marland Kitchen atorvastatin (LIPITOR) 20 MG tablet Take 1 tablet (20 mg total) by mouth daily. 30 tablet 11  . docusate sodium (COLACE) 100 MG capsule Take 100 mg by mouth 2 (two) times daily.      Marland Kitchen escitalopram (LEXAPRO) 20 MG tablet Take 20 mg by mouth daily.    Marland Kitchen esomeprazole (NEXIUM) 40 MG capsule Take 40 mg by mouth daily before breakfast.      . fenofibrate 160 MG tablet TAKE 1 TABLET BY MOUTH DAILY WITH FOOD 30 tablet 10  . fluticasone furoate-vilanterol (BREO ELLIPTA) 100-25 MCG/INH AEPB Inhale 1 puff into the lungs daily. 3 each 1  . hydrochlorothiazide (HYDRODIURIL) 25 MG tablet Take 25 mg by mouth daily.      Marland Kitchen losartan (COZAAR) 25 MG tablet TAKE 1 TABLET BY MOUTH ONCE A DAY 30 tablet 2  . metoprolol tartrate (LOPRESSOR) 25 MG tablet TAKE 1 TABLET BY MOUTH TWICE A DAY 60 tablet 2  . montelukast (SINGULAIR) 10 MG tablet Take 1 tablet (10 mg total) by mouth at bedtime. 30 tablet 11  . nitroGLYCERIN (NITROSTAT) 0.4 MG SL tablet Place 0.4  mg under the tongue every 5 (five) minutes as needed.      Marland Kitchen oxyCODONE (ROXICODONE) 15 MG immediate release tablet Take 15 mg by mouth every 4 (four) hours as needed.      Marland Kitchen Spacer/Aero-Holding Chambers (AEROCHAMBER MV) inhaler Use as instructed 1 each 0  . Testosterone (ANDROGEL PUMP) 20.25 MG/ACT (1.62%) GEL Place 2 Squirts onto the skin daily.      Marland Kitchen thiamine 100 MG tablet Take 100 mg by mouth daily.      Marland Kitchen zolpidem (AMBIEN) 10 MG tablet Take 12.5 mg by mouth at bedtime as needed for sleep.     . fluticasone furoate-vilanterol (BREO ELLIPTA) 100-25 MCG/INH AEPB Inhale 1 puff into the lungs daily. 1 each 0  . mometasone-formoterol (DULERA) 200-5 MCG/ACT AERO Inhale 2 puffs into the lungs 2 (two) times daily. 1 Inhaler 5   No facility-administered medications prior to visit.     Allergies:   Review of patient's allergies indicates no known allergies.   Social History   Social History  .  Marital Status: Married    Spouse Name: N/A  . Number of Children: N/A  . Years of Education: N/A   Occupational History  . transportation sales    Social History Main Topics  . Smoking status: Never Smoker   . Smokeless tobacco: Never Used  . Alcohol Use: 0.0 oz/week    0 Standard drinks or equivalent per week     Comment: Moderate, beer  . Drug Use: No  . Sexual Activity: Not Asked   Other Topics Concern  . None   Social History Narrative     Family History:  The patient's family history includes COPD in his mother; Cancer in his father.   ROS:   Please see the history of present illness.    ROS All other systems reviewed and are negative.   PHYSICAL EXAM:   VS:  BP 110/52 mmHg  Pulse 56  Ht 5\' 9"  (1.753 m)  Wt 251 lb 3.2 oz (113.944 kg)  BMI 37.08 kg/m2  SpO2 92%   GEN: Well nourished, well developed, in no acute distress HEENT: normal Neck: no JVD, carotid bruits, or masses Cardiac: RRR; no murmurs, rubs, or gallops,no edema.  Intact distal pulses bilaterally.  Respiratory:  clear to auscultation bilaterally, normal work of breathing GI: soft, nontender, nondistended, + BS MS: no deformity or atrophy Skin: warm and dry, no rash Neuro:  Alert and Oriented x 3, Strength and sensation are intact Psych: euthymic mood, full affect  Wt Readings from Last 3 Encounters:  05/04/16 251 lb 3.2 oz (113.944 kg)  01/29/16 270 lb 12.8 oz (122.834 kg)  01/10/16 262 lb (118.842 kg)      Studies/Labs Reviewed:   EKG:  EKG is not ordered today.    Recent Labs: 08/14/2015: ALT 9   Lipid Panel    Component Value Date/Time   CHOL 104 08/14/2015 1208   TRIG 65.0 08/14/2015 1208   HDL 53.30 08/14/2015 1208   CHOLHDL 2 08/14/2015 1208   VLDL 13.0 08/14/2015 1208   LDLCALC 38 08/14/2015 1208   LDLDIRECT 33.0 05/07/2015 1233    Additional studies/ records that were reviewed today include:  none    ASSESSMENT:    1. Atherosclerosis of native coronary artery of  native heart without angina pectoris   2. Essential hypertension, benign   3. Obstructive sleep apnea   4. Obesity   5. Dyslipidemia   6. Depression  PLAN:  In order of problems listed above:  1. ASCAD with no angina.  Continue ASA/statin/BB 2. HTN - BP well controlled on current regimen.  Continue BB/diuretic/ARB.  Check BMET. 3. OSA - he is doing well with his CPAP.  Patient has been using and benefiting from CPAP use and will continue to benefit from therapy. I will get a d/l from his DME. 4. Obesity - I have encouraged him to get into a routine exercise program and cut back on carbs and portions.  5. Dyslipidemia with LDL goal < 70.  Continue statin.  Repeat FLP and ALT. 6. Depression - He is having a lot of problems with depression and his wife is worried that it might be the BB.  I will have his wife call the insurance company to see what the cost of Bystolic 5mg  daily would be and if not cost prohibitive, will change to bystolic.    Medication Adjustments/Labs and Tests Ordered: Current medicines are reviewed at length with the patient today.  Concerns regarding medicines are outlined above.  Medication changes, Labs and Tests ordered today are listed in the Patient Instructions below.  There are no Patient Instructions on file for this visit.   Signed, Fransico Him, MD  05/04/2016 10:04 AM    [address], [place], [place]  [address] Phone: [phone]; Fax: [phone]

## 2016-05-04 NOTE — Patient Instructions (Signed)
Medication Instructions:  Your physician recommends that you continue on your current medications as directed. Please refer to the Current Medication list given to you today.   Labwork: TODAY: BMET, LFTs, Lipids  Testing/Procedures: None  Follow-Up: Your physician wants you to follow-up in: 6 months with Dr. Turner. You will receive a reminder letter in the mail two months in advance. If you don't receive a letter, please call our office to schedule the follow-up appointment.   Any Other Special Instructions Will Be Listed Below (If Applicable).     If you need a refill on your cardiac medications before your next appointment, please call your pharmacy.   

## 2016-05-04 NOTE — Patient Instructions (Signed)
ICD-9-CM ICD-10-CM   1. Asthma, chronic, mild persistent, uncomplicated 123456 [id number] Nitric oxide    Asthma stable but symptoms getting worse withoiut inhalers Too bad that [employer] program paper work got lost  Plan - start dulera 100/4.5, 2 puff twice daily  - have your wife drop off paper work for [employer] 05/05/16 and I intend to sign it by end of this week - albtuterl as needed -flu shot in fall  FOllowup 5 months or sooner - feno and acq at followup

## 2016-05-06 ENCOUNTER — Telehealth: Payer: Self-pay | Admitting: Internal Medicine

## 2016-05-06 NOTE — Telephone Encounter (Signed)
Pt is aware that I have personally handed the paperwork to Caryl Pina to have MR complete prior to PM clinic tomorrow.

## 2016-05-06 NOTE — Telephone Encounter (Signed)
Signed and given to ashely 05/06/2016 3:27 PM in side b

## 2016-05-07 ENCOUNTER — Telehealth: Payer: Self-pay | Admitting: Cardiology

## 2016-05-07 DIAGNOSIS — I10 Essential (primary) hypertension: Secondary | ICD-10-CM

## 2016-05-07 MED ORDER — HYDROCHLOROTHIAZIDE 12.5 MG PO TABS: 12.5000 mg | ORAL_TABLET | Freq: Every day | ORAL | Status: DC

## 2016-05-07 NOTE — Telephone Encounter (Signed)
Informed patient's DPR of results and verbal understanding expressed.   Instructed her to have patient DECREASE HCTZ to 12.5 mg daily and to check BP daily for a week and call with results. Repeat BMET scheduled Friday, June 16. DPR agrees with treatment plan.

## 2016-05-07 NOTE — Telephone Encounter (Signed)
-----   Message from [name], MD sent at 05/05/2016  1:01 PM EDT ----- Creatinine slightly increased - decrease HCTZ to 12.5mg  daily and repeat BMET in 1 week.  Check BP daily for a week and call with results.

## 2016-05-07 NOTE — Telephone Encounter (Signed)
Pt called in stating that he was returning your call about his test results. Please f/u

## 2016-05-07 NOTE — Telephone Encounter (Signed)
Forms filled out and mailed to [employer].   atc pt to make aware, no answer, vm full.  Wcb.

## 2016-05-08 NOTE — Telephone Encounter (Signed)
lmomtcb for pt 

## 2016-05-08 NOTE — Telephone Encounter (Signed)
Spoke with patient and informed him that paperwork is complete and mailed to [employer]. He was very happy to hear that this was completed. He didn't anything further-prm

## 2016-05-11 ENCOUNTER — Encounter: Payer: Self-pay | Admitting: *Deleted

## 2016-05-15 ENCOUNTER — Other Ambulatory Visit (INDEPENDENT_AMBULATORY_CARE_PROVIDER_SITE_OTHER): Payer: Medicare Other | Admitting: *Deleted

## 2016-05-15 DIAGNOSIS — I10 Essential (primary) hypertension: Secondary | ICD-10-CM | POA: Diagnosis not present

## 2016-05-15 LAB — BASIC METABOLIC PANEL
BUN: 24 mg/dL (ref 7–25)
CO2: 30 mmol/L (ref 20–31)
CREATININE: 1.56 mg/dL — AB (ref 0.70–1.18)
Calcium: 9.2 mg/dL (ref 8.6–10.3)
Chloride: 100 mmol/L (ref 98–110)
GLUCOSE: 82 mg/dL (ref 65–99)
POTASSIUM: 4.5 mmol/L (ref 3.5–5.3)
Sodium: 136 mmol/L (ref 135–146)

## 2016-05-19 ENCOUNTER — Telehealth: Payer: Self-pay

## 2016-05-19 ENCOUNTER — Telehealth: Payer: Self-pay | Admitting: Adult Health

## 2016-05-19 DIAGNOSIS — I10 Essential (primary) hypertension: Secondary | ICD-10-CM

## 2016-05-19 MED ORDER — MOMETASONE FURO-FORMOTEROL FUM 100-5 MCG/ACT IN AERO: 2.0000 | INHALATION_SPRAY | Freq: Two times a day (BID) | RESPIRATORY_TRACT | Status: DC

## 2016-05-19 MED ORDER — ALBUTEROL SULFATE HFA 108 (90 BASE) MCG/ACT IN AERS: 2.0000 | INHALATION_SPRAY | Freq: Four times a day (QID) | RESPIRATORY_TRACT | Status: DC | PRN

## 2016-05-19 NOTE — Telephone Encounter (Signed)
Informed patient of results and verbal understanding expressed.  Instructed patient to STOP HCTZ and check BP daily for a week and call with results. BMET scheduled for Wednesday, June 28. Patient agrees with treatment plan.

## 2016-05-19 NOTE — Telephone Encounter (Signed)
-----   Message from [name], MD sent at 05/19/2016  3:09 PM EDT ----- Stop HCTZ due to increased creatinine and check BMET in 1 week.  Please have patient check BP daily for a week and call with results

## 2016-05-19 NOTE — Telephone Encounter (Signed)
Spoke with patient-he is aware that I have left Dulera 100/5 sample and ProAir Respiclick inhaler sample at front desk for him. Nothing more needed at this time.

## 2016-05-19 NOTE — Telephone Encounter (Signed)
Patient wife called back checking on samples. Patient needs [facility] or Keith Bernard, still waiting on patient assistance program and he is completely out of medicine -prm

## 2016-05-27 ENCOUNTER — Other Ambulatory Visit: Payer: Medicare Other

## 2016-05-28 ENCOUNTER — Other Ambulatory Visit (INDEPENDENT_AMBULATORY_CARE_PROVIDER_SITE_OTHER): Payer: Medicare Other | Admitting: *Deleted

## 2016-05-28 DIAGNOSIS — I10 Essential (primary) hypertension: Secondary | ICD-10-CM | POA: Diagnosis not present

## 2016-05-28 LAB — BASIC METABOLIC PANEL
BUN: 18 mg/dL (ref 7–25)
CO2: 31 mmol/L (ref 20–31)
Calcium: 9.7 mg/dL (ref 8.6–10.3)
Chloride: 96 mmol/L — ABNORMAL LOW (ref 98–110)
Creat: 1.13 mg/dL (ref 0.70–1.18)
GLUCOSE: 85 mg/dL (ref 65–99)
POTASSIUM: 4.7 mmol/L (ref 3.5–5.3)
SODIUM: 136 mmol/L (ref 135–146)

## 2016-06-01 ENCOUNTER — Other Ambulatory Visit: Payer: Self-pay | Admitting: Internal Medicine

## 2016-06-22 ENCOUNTER — Other Ambulatory Visit: Payer: Self-pay | Admitting: Internal Medicine

## 2016-06-29 ENCOUNTER — Telehealth: Payer: Self-pay | Admitting: Internal Medicine

## 2016-06-29 NOTE — Telephone Encounter (Signed)
Spoke with pt's spouse, wanted to know if the dosage of proair respiclik was different from proair HFA.  All questions were answered, pt's wife expressed understanding.  Nothing further needed.

## 2016-09-03 ENCOUNTER — Other Ambulatory Visit: Payer: Self-pay | Admitting: Internal Medicine

## 2016-09-05 ENCOUNTER — Other Ambulatory Visit: Payer: Self-pay | Admitting: Internal Medicine

## 2016-09-11 ENCOUNTER — Other Ambulatory Visit: Payer: Self-pay | Admitting: Internal Medicine

## 2016-10-16 ENCOUNTER — Telehealth: Payer: Self-pay | Admitting: Internal Medicine

## 2016-10-16 MED ORDER — ALBUTEROL SULFATE HFA 108 (90 BASE) MCG/ACT IN AERS: 2.0000 | INHALATION_SPRAY | Freq: Four times a day (QID) | RESPIRATORY_TRACT | 6 refills | Status: DC | PRN

## 2016-10-16 NOTE — Telephone Encounter (Signed)
Called and spoke with pts wife and she is aware of refill of the proair that has been sent in to [facility].  She stated that the pt has appt in [place] with MR.

## 2016-11-09 ENCOUNTER — Other Ambulatory Visit: Payer: Self-pay | Admitting: Internal Medicine

## 2016-11-09 ENCOUNTER — Other Ambulatory Visit: Payer: Self-pay | Admitting: Cardiology

## 2016-11-14 ENCOUNTER — Other Ambulatory Visit: Payer: Self-pay | Admitting: Internal Medicine

## 2016-11-16 ENCOUNTER — Other Ambulatory Visit: Payer: Self-pay

## 2016-11-16 ENCOUNTER — Telehealth: Payer: Self-pay | Admitting: Internal Medicine

## 2016-11-16 MED ORDER — LOSARTAN POTASSIUM 25 MG PO TABS: 25.0000 mg | ORAL_TABLET | Freq: Every day | ORAL | 0 refills | Status: DC

## 2016-11-16 NOTE — Telephone Encounter (Signed)
Pt. Called for a refill of his losartan a rx was sent into his pharmacy. He does have a follow up appointment with MR this Jan. Nothing further is needed at this time.

## 2016-12-08 ENCOUNTER — Ambulatory Visit: Payer: Medicare Other | Admitting: Cardiology

## 2016-12-09 ENCOUNTER — Other Ambulatory Visit: Payer: Self-pay | Admitting: Adult Health

## 2016-12-09 MED ORDER — MOMETASONE FURO-FORMOTEROL FUM 100-5 MCG/ACT IN AERO: 2.0000 | INHALATION_SPRAY | Freq: Two times a day (BID) | RESPIRATORY_TRACT | 0 refills | Status: DC

## 2016-12-10 ENCOUNTER — Ambulatory Visit: Payer: Medicare Other | Admitting: Cardiology

## 2016-12-14 ENCOUNTER — Other Ambulatory Visit: Payer: Self-pay | Admitting: Cardiology

## 2016-12-14 ENCOUNTER — Other Ambulatory Visit: Payer: Self-pay | Admitting: Internal Medicine

## 2016-12-16 ENCOUNTER — Other Ambulatory Visit: Payer: Self-pay | Admitting: Cardiology

## 2016-12-16 ENCOUNTER — Ambulatory Visit: Payer: Medicare Other | Admitting: Internal Medicine

## 2016-12-17 ENCOUNTER — Ambulatory Visit: Payer: Medicare Other | Admitting: Cardiology

## 2016-12-24 ENCOUNTER — Ambulatory Visit: Payer: Medicare Other | Admitting: Cardiology

## 2016-12-24 ENCOUNTER — Telehealth: Payer: Self-pay

## 2016-12-24 NOTE — Telephone Encounter (Signed)
Per Dr. Radford Pax, called patient to reschedule appointment. His wife states he is doing fine and has no complaints. She states one day is blood pressure was a little low (90s/60s), but he was asymptomatic. Instructed her to check BP daily for a week and call back with results.  Patient will be seen 2/21 with Dr. Radford Pax. She was grateful for call.

## 2017-01-04 ENCOUNTER — Ambulatory Visit (INDEPENDENT_AMBULATORY_CARE_PROVIDER_SITE_OTHER): Payer: Medicare Other | Admitting: Internal Medicine

## 2017-01-04 ENCOUNTER — Telehealth: Payer: Self-pay | Admitting: Internal Medicine

## 2017-01-04 ENCOUNTER — Encounter: Payer: Self-pay | Admitting: Internal Medicine

## 2017-01-04 VITALS — BP 140/82 | HR 66 | Ht 69.0 in

## 2017-01-04 DIAGNOSIS — J4541 Moderate persistent asthma with (acute) exacerbation: Secondary | ICD-10-CM

## 2017-01-04 MED ORDER — ALBUTEROL SULFATE HFA 108 (90 BASE) MCG/ACT IN AERS: 2.0000 | INHALATION_SPRAY | Freq: Four times a day (QID) | RESPIRATORY_TRACT | 3 refills | Status: DC | PRN

## 2017-01-04 MED ORDER — PREDNISONE 10 MG PO TABS: ORAL_TABLET | ORAL | 0 refills | Status: DC

## 2017-01-04 MED ORDER — MOMETASONE FURO-FORMOTEROL FUM 100-5 MCG/ACT IN AERO: 2.0000 | INHALATION_SPRAY | Freq: Two times a day (BID) | RESPIRATORY_TRACT | 3 refills | Status: DC

## 2017-01-04 MED ORDER — AZITHROMYCIN 250 MG PO TABS: ORAL_TABLET | ORAL | 0 refills | Status: DC

## 2017-01-04 NOTE — Patient Instructions (Addendum)
ICD-9-CM ICD-10-CM   1. Moderate persistent asthma with acute exacerbation 493.92 J45.41      Asthma with unresolved exacerbation due to weather, travel and emotional loss of sibling  Plan - scontinue dulera 100/4.5, 2 puff twice daily - albtuterl as needed - Zpak x 1 course  - Take prednisone 40 mg daily x 2 days, then 20mg  daily x 2 days, then 10mg  daily x 2 days, then 5mg  daily x 2 days and stop   FOllowup 6 months or sooner - feno and acq at followup

## 2017-01-04 NOTE — Progress Notes (Signed)
Subjective:     Patient ID: Keith Bernard, male   DOB: 07-31-1946, 71 y.o.   MRN: [id number]  HPI  OV 01/29/2016  Chief Complaint  Patient presents with  . Follow-up    Pt here after acute visit with SG on 2.10.17. Pt states his breathing has imrpoved since last OV. Pt states he has occassional wheezing and mild non prod cough. Pt denies CP/tightness.     [name] returns for chronic obstructive lung disease asthma follow-up. He is now on Dulera plus Singulair. He is compliant with Dulera. He says with improved compliance his symptoms are a lot better. His wife is here with him. Most recently he had a flareup and was treated with prednisone. I review the chart when he was seen by [name] gross my nurse practitioner. Currently he feels asthma is well controlled. However he still uses albuterol multiple times a day for rescue but denies any nocturnal symptoms or daytime symptoms. He is compliant with this CPAP for sleep apnea.  Review of the chart shows this is in December 2012 has elevated used to flows of 400 cells per cubic millimeter. I notice that and IgE was never checked. In any event subjectively is well controlled currently. Also noted on alpha-1 has not been checked.  Asthma control questionnaire shows that at night he hardly ever wakes up because of asthma. When he wakes up he has very mild symptoms. In the daytime he is moderately limited because of asthma although this could be because of obesity. He also has moderate shortness of breath because of asthma although this could be because of obesity. He is wheezing a lot of the time and uses albuterol 3-4 puffs on most days. Nevertheless average score of 2.2 show significant improvement after starting [place] 05/04/2016  Chief Complaint  Patient presents with  . Follow-up    Pt c/o occasional wheeze since not being on Breo for about a week. Pt has been using albuterol HFA more. Pt denies cough/SOB/CP/tightness.    Follow-up  asthma. Last seen March 2017. This is a routine follow-up. I put him on Breo when she really liked. However the cost dollars out of pocket because he is in the donut hole. So he ran out of it over a week ago and is having increased symptoms. Asthma control questionnaire is 2.0 and still within his range from prior visits. Exhaled nitric oxide is just over 25 suggests some amount of active airway inflammation. His wife is here with him and she says that she got approved for the [facility] patient assistance program but apparently between our office in the mailroom the paperwork has been loss. She is willing to redo the paperwork and she is wondering if I could sign my part of it this week.  In terms of symptoms specifically tells me that he does wake up a few times at night. When he wakes up he has very mild symptoms. Slightly limited in his activities because of asthma. He has moderate amount of shortness of breath with exertion. He does wheeze a little of the time and is using albuterol for rescue at least one or 2 times daily. 5 point score is 2/.0 showing activity - is worse this past week or so is within normal   OV 01/04/2017   Chief Complaint  Patient presents with  . Follow-up    Pt states last week he had an 'asthma attack' and went to ED in [place] and was given steroid  and abx per pt's wife. Pt states he feels he is improving. Pt states he does not feel back to baseline. Pt denies CP/tightness and f/c/s.     Follow-up moderate persistent asthma  Last seen June 2017. He presents with his wife as always. He is on Dulera through the assistance program. Some 3 weeks ago his sister died from terminal cancer. Take a flight to [place]. His been dealing with those emotions. Because of the travel, emotions and weather change some 10 days ago he had an asthma exacerbation but never below few days with increased cough and wheezing. He went to a local emergency department and got one shot of steroids  and was given 7 days of cephalexin which is completed. Despite this is not fully better. He still has some cough and wheeze and yellow sputum. There is no fever.    Asthma Control Panel -December 2012eos 400 cells per cubic millimeter \ - IgE not checked as of 05/04/2016 - Alpha 1 is MM 08/23/2015  01/29/2016  05/04/2016   Current Med Regimen symbicort  Dulera  Off breo for several days.   ACQ 5 point- 1 week. wtd avg score. <1.0 is good control 0.75-1.25 is grey zone. >1.25 poor control. Delta 0.5 is clinically meaningful 3.2 2.2 2.0  ACQ 7 point - 1 week. wtd avg score. <1.0 is good control 0.75-1.25 is grey zone. >1.25 poor control. Delta 0.5 is clinically meaningful x    ACT - a [employer] test - 4 week. Total score. Max is 25, Lower score is worse.  <19 = poor control x    FeNO ppB 34 x 26  FeV1  x  x  Planned intervention  for visit Start dulera + singulair Cont dulera        has a past medical history of Chronic pain; Coronary artery disease; Depression; Depression (05/04/2016); DJD (degenerative joint disease); Dyslipidemia; ETOH abuse; GERD (gastroesophageal reflux disease); Hypertension; OSA (obstructive sleep apnea); and RBBB.   reports that he has never smoked. He has never used smokeless tobacco.  Past Surgical History:  Procedure Laterality Date  . CORONARY STENT PLACEMENT    . REPLACEMENT TOTAL KNEE     left knee    No Known Allergies  Immunization History  Administered Date(s) Administered  . DTaP 07/22/2001  . Influenza Split 09/30/2014  . Influenza, High Dose Seasonal PF 11/04/2016  . Influenza,inj,Quad PF,36+ Mos 10/23/2015  . Pneumococcal Conjugate-13 01/02/2015, 10/01/2015  . Pneumococcal Polysaccharide-23 04/11/2012  . Tdap 12/22/2016  . Zoster 01/02/2015    Family History  Problem Relation Age of Onset  . Cancer Father     pancreatic   . COPD Mother      Current Outpatient Prescriptions:  .  albuterol (PROAIR HFA) 108 (90 Base) MCG/ACT inhaler, Inhale 2  puffs into the lungs every 6 (six) hours as needed for wheezing or shortness of breath., Disp: 1 Inhaler, Rfl: 6 .  aspirin 81 MG tablet, Take 81 mg by mouth daily.  , Disp: , Rfl:  .  atorvastatin (LIPITOR) 20 MG tablet, TAKE 1 TABLET (20 MG TOTAL) BY MOUTH DAILY., Disp: 30 tablet, Rfl: 4 .  escitalopram (LEXAPRO) 20 MG tablet, Take 20 mg by mouth daily., Disp: , Rfl:  .  esomeprazole (NEXIUM) 40 MG capsule, Take 40 mg by mouth daily before breakfast.  , Disp: , Rfl:  .  fenofibrate 160 MG tablet, TAKE 1 TABLET BY MOUTH DAILY WITH FOOD, Disp: 30 tablet, Rfl: 5 .  LORazepam (  ATIVAN) 1 MG tablet, Take 1 mg by mouth every 8 (eight) hours., Disp: , Rfl:  .  losartan (COZAAR) 25 MG tablet, Take 1 tablet (25 mg total) by mouth daily., Disp: 30 tablet, Rfl: 0 .  metoprolol tartrate (LOPRESSOR) 25 MG tablet, TAKE 1 TABLET BY MOUTH TWICE A DAY, Disp: 60 tablet, Rfl: 0 .  Misc Natural Products (COLON CLEANSE PO), Take by mouth., Disp: , Rfl:  .  mometasone-formoterol (DULERA) 100-5 MCG/ACT AERO, Inhale 2 puffs into the lungs 2 (two) times daily., Disp: 1 Inhaler, Rfl: 0 .  montelukast (SINGULAIR) 10 MG tablet, TAKE 1 TABLET BY MOUTH ATBEDTIME., Disp: 30 tablet, Rfl: 11 .  nitroGLYCERIN (NITROSTAT) 0.4 MG SL tablet, Place 0.4 mg under the tongue every 5 (five) minutes as needed.  , Disp: , Rfl:  .  oxyCODONE (ROXICODONE) 15 MG immediate release tablet, Take 15 mg by mouth every 4 (four) hours as needed.  , Disp: , Rfl:  .  Spacer/Aero-Holding Chambers (AEROCHAMBER MV) inhaler, Use as instructed, Disp: 1 each, Rfl: 0 .  Testosterone (ANDROGEL PUMP) 20.25 MG/ACT (1.62%) GEL, Place 2 Squirts onto the skin daily.  , Disp: , Rfl:  .  thiamine 100 MG tablet, Take 100 mg by mouth daily.  , Disp: , Rfl:  .  zolpidem (AMBIEN) 10 MG tablet, Take 12.5 mg by mouth at bedtime as needed for sleep. , Disp: , Rfl:    Review of Systems     Objective:   Physical Exam  Constitutional: He is oriented to person, place,  and time. He appears well-developed and well-nourished. No distress.  HENT:  Head: Normocephalic and atraumatic.  Right Ear: External ear normal.  Left Ear: External ear normal.  Mouth/Throat: Oropharynx is clear and moist. No oropharyngeal exudate.  Eyes: Conjunctivae and EOM are normal. Pupils are equal, round, and reactive to light. Right eye exhibits no discharge. Left eye exhibits no discharge. No scleral icterus.  Neck: Normal range of motion. Neck supple. No JVD present. No tracheal deviation present. No thyromegaly present.  Cardiovascular: Normal rate, regular rhythm and intact distal pulses.  Exam reveals no gallop and no friction rub.   No murmur heard. Pulmonary/Chest: Effort normal and breath sounds normal. No respiratory distress. He has no wheezes. He has no rales. He exhibits no tenderness.  Abdominal: Soft. Bowel sounds are normal. He exhibits no distension and no mass. There is no tenderness. There is no rebound and no guarding.  Musculoskeletal: Normal range of motion. He exhibits no edema or tenderness.  Lymphadenopathy:    He has no cervical adenopathy.  Neurological: He is alert and oriented to person, place, and time. He has normal reflexes. No cranial nerve deficit. Coordination normal.  Skin: Skin is warm and dry. No rash noted. He is not diaphoretic. No erythema. No pallor.  Psychiatric: He has a normal mood and affect. His behavior is normal. Judgment and thought content normal.  Nursing note and vitals reviewed.   Vitals:   01/04/17 1214  BP: 140/82  Pulse: 66  SpO2: 97%  Height: 5\' 9"  (1.753 m)    Estimated body mass index is 36.92 kg/m as calculated from the following:   Height as of 05/04/16: 5\' 9"  (1.753 m).   Weight as of 05/04/16: 250 lb (113.4 kg).      Assessment:       ICD-9-CM ICD-10-CM   1. Moderate persistent asthma with acute exacerbation 493.92 J45.41    Mild acute exacerbation    Plan:  Asthma with unresolved exacerbation due to  weather, travel and emotional loss of sibling  Plan - scontinue dulera 100/4.5, 2 puff twice daily - albtuterl as needed - Zpak x 1 course  - Take prednisone 40 mg daily x 2 days, then 20mg  daily x 2 days, then 10mg  daily x 2 days, then 5mg  daily x 2 days and stop   FOllowup 6 months or sooner - feno and acq at followup   Dr. Brand Males, M.D., [facility]C.P Pulmonary and Critical Care Medicine Staff Physician [place] [facility] Pager: [phone], If no answer or between  15:00h - 7:00h: call [phone]  01/04/2017 12:35 PM

## 2017-01-04 NOTE — Addendum Note (Signed)
Addended by: Collier Salina on: 01/04/2017 04:01 PM   Modules accepted: Orders

## 2017-01-04 NOTE — Telephone Encounter (Signed)
Spoke with pt. And sent in refills to his pharmacy of choice. Nothing further is needed.    2. Brand Males, MD (Physician) at 01/04/2017 12:19 PM - Signed      ICD-9-CM ICD-10-CM   1. Moderate persistent asthma with acute exacerbation 493.92 J45.41      Asthma with unresolved exacerbation due to weather, travel and emotional loss of sibling  Plan - scontinue dulera 100/4.5, 2 puff twice daily - albtuterl as needed - Zpak x 1 course  - Take prednisone 40 mg daily x 2 days, then 20mg  daily x 2 days, then 10mg  daily x 2 days, then 5mg  daily x 2 days and stop   FOllowup 6 months or sooner

## 2017-01-05 ENCOUNTER — Telehealth: Payer: Self-pay | Admitting: Cardiology

## 2017-01-05 ENCOUNTER — Telehealth: Payer: Self-pay | Admitting: Internal Medicine

## 2017-01-05 ENCOUNTER — Other Ambulatory Visit: Payer: Self-pay | Admitting: Internal Medicine

## 2017-01-05 MED ORDER — LOSARTAN POTASSIUM 25 MG PO TABS: 25.0000 mg | ORAL_TABLET | Freq: Every day | ORAL | 0 refills | Status: DC

## 2017-01-05 NOTE — Telephone Encounter (Signed)
This should be deferred to cards or primary  Dr. Brand Males, M.D., [facility].C.P Pulmonary and Critical Care Medicine Staff Physician [place] [facility] Pager: [phone], If no answer or between  15:00h - 7:00h: call [phone]  01/05/2017 5:38 PM

## 2017-01-05 NOTE — Telephone Encounter (Signed)
Patient's wife called to report the patient has been out of Losartan for about 3 weeks and his BP is elevated. Originally, his Losartan was prescribed by [name] but he is now referring to Cards for refills. At the doctor, his BP was 161/99 and 140/89. She gave him one of her Hyzaar today to help and has not rechecked his BP. Attempted to schedule patient this week with an APP for evaluation, but patient's wife refused. Attempted to schedule Monday with Dr. Radford Pax, but it was too early for patient. They will keep OV with Dr. Radford Pax 2/21 and await to see if Dr. Radford Pax will fill Losartan 25 mg in the meantime.

## 2017-01-05 NOTE — Telephone Encounter (Signed)
New message    Pt c/o BP issue: STAT if pt c/o blurred vision, one-sided weakness or slurred speech  1. What are your last 5 BP readings? 161/99-02/05/18 140/89-02/05/18  2. Are you having any other symptoms (ex. Dizziness, headache, blurred vision, passed out)? headache  3. What is your BP issue? Pt wife is calling because his bp is high.   She states he has losartan 25mg , but he has not had it in 3 weeks. Pt wife states that his pulmonary dr prescribes this medication and would like for Dr. Radford Pax to take over this. Pt wife states she has losartan for herself-12.5mg  and was going to give the pt two of hers.

## 2017-01-05 NOTE — Telephone Encounter (Signed)
Spoke with pt's spouse, who is requesting refills on losartan 25mg . Last refilled on 11-16-16 for 30 tabs with 0 refills. Pt's spouse states pt has been without medication X 2wk. [name] is requesting refills for losartan 25mg .  MR please advise. Thanks.

## 2017-01-06 MED ORDER — LOSARTAN POTASSIUM 25 MG PO TABS: 25.0000 mg | ORAL_TABLET | Freq: Every day | ORAL | 0 refills | Status: DC

## 2017-01-06 NOTE — Telephone Encounter (Signed)
Spoke with pt's spouse and made her aware of MR's recommendation.  Keith Bernard voiced her understanding and had no further questions. Nothing further needed.

## 2017-01-06 NOTE — Telephone Encounter (Signed)
Refill called in.  Patient's wife notified. She was grateful for assistance.

## 2017-01-06 NOTE — Telephone Encounter (Signed)
Ok to refill Losartan

## 2017-01-11 ENCOUNTER — Ambulatory Visit: Payer: Medicare Other | Admitting: Cardiology

## 2017-01-20 ENCOUNTER — Ambulatory Visit (INDEPENDENT_AMBULATORY_CARE_PROVIDER_SITE_OTHER): Payer: Medicare Other | Admitting: Cardiology

## 2017-01-20 ENCOUNTER — Ambulatory Visit: Payer: Medicare Other | Admitting: Cardiology

## 2017-01-20 ENCOUNTER — Encounter: Payer: Self-pay | Admitting: Cardiology

## 2017-01-20 VITALS — BP 118/62 | HR 58 | Ht 69.0 in | Wt 256.6 lb

## 2017-01-20 DIAGNOSIS — I420 Dilated cardiomyopathy: Secondary | ICD-10-CM | POA: Diagnosis not present

## 2017-01-20 DIAGNOSIS — I10 Essential (primary) hypertension: Secondary | ICD-10-CM | POA: Diagnosis not present

## 2017-01-20 DIAGNOSIS — E6609 Other obesity due to excess calories: Secondary | ICD-10-CM

## 2017-01-20 DIAGNOSIS — I2510 Atherosclerotic heart disease of native coronary artery without angina pectoris: Secondary | ICD-10-CM | POA: Diagnosis not present

## 2017-01-20 DIAGNOSIS — G4733 Obstructive sleep apnea (adult) (pediatric): Secondary | ICD-10-CM | POA: Diagnosis not present

## 2017-01-20 DIAGNOSIS — E785 Hyperlipidemia, unspecified: Secondary | ICD-10-CM

## 2017-01-20 MED ORDER — METOPROLOL TARTRATE 25 MG PO TABS: 25.0000 mg | ORAL_TABLET | Freq: Two times a day (BID) | ORAL | 3 refills | Status: DC

## 2017-01-20 MED ORDER — LOSARTAN POTASSIUM 25 MG PO TABS: 25.0000 mg | ORAL_TABLET | Freq: Every day | ORAL | 3 refills | Status: DC

## 2017-01-20 MED ORDER — ATORVASTATIN CALCIUM 20 MG PO TABS: 20.0000 mg | ORAL_TABLET | Freq: Every day | ORAL | 3 refills | Status: DC

## 2017-01-20 MED ORDER — FENOFIBRATE 160 MG PO TABS: 160.0000 mg | ORAL_TABLET | Freq: Every day | ORAL | 3 refills | Status: DC

## 2017-01-20 NOTE — Progress Notes (Signed)
Cardiology Office Note    Date:  01/20/2017   ID:  Keith Bernard, DOB Feb 13, 1946, MRN [id number]  PCP:  Keith Boers, MD  Cardiologist:  Keith Him, MD   Chief Complaint  Patient presents with  . Coronary Artery Disease  . Hypertension  . Sleep Apnea  . Hyperlipidemia    History of Present Illness:  Keith Bernard is a 71 y.o. male  with a history of OSA on PAP therapy, ASCAD, HTN, obesity and dyslipidemia. He is doing well. He denies any chest pain, dizziness, palpitations, PND, orthopnea or syncope. He has chronic SOB from asthma and obesity and has noticed that when he does not exercise in the winter he gets more SOB. he has gained 6lbs since last June.  He has chronic LE edema from his neuropathy but this is stable and well controlled on diuretics. He is using his PAP without any problems. He tolerates the nasal mask with chin strap and feels the pressure is adequate. He feels rested when he gets up in the am but does nap some during the day when he sits down.  He goes to bed as late as 3am and gets up at 7am and gets up and lets the dogs out and then goes back to bed until 11am.    Past Medical History:  Diagnosis Date  . Chronic pain   . 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 Depression   . Depression 05/04/2016  . DJD (degenerative joint disease)   . Dyslipidemia   . ETOH abuse   . GERD (gastroesophageal reflux disease)   . Hypertension   . OSA (obstructive sleep apnea)    Moderate OSA w AHI 17.7/hr now on VPAP at EEP at 7cm H2O, min PS 3cm H2O,max PS 15cm H2O  . RBBB     Past Surgical History:  Procedure Laterality Date  . CORONARY STENT PLACEMENT    . REPLACEMENT TOTAL KNEE     left knee    Current Medications: Current Meds  Medication Sig  . albuterol (PROAIR HFA) 108 (90 Base) MCG/ACT  inhaler Inhale 2 puffs into the lungs every 6 (six) hours as needed for wheezing or shortness of breath.  Marland Kitchen aspirin 81 MG tablet Take 81 mg by mouth daily.    Marland Kitchen atorvastatin (LIPITOR) 20 MG tablet TAKE 1 TABLET (20 MG TOTAL) BY MOUTH DAILY.  Marland Kitchen azithromycin (ZITHROMAX) 250 MG tablet Take 2 pills today then one a day for 4 additional days  . escitalopram (LEXAPRO) 20 MG tablet Take 20 mg by mouth daily.  Marland Kitchen esomeprazole (NEXIUM) 40 MG capsule Take 40 mg by mouth daily before breakfast.    . fenofibrate 160 MG tablet TAKE 1 TABLET BY MOUTH DAILY WITH FOOD  . LORazepam (ATIVAN) 1 MG tablet Take 1 mg by mouth every 8 (eight) hours.  Marland Kitchen losartan (COZAAR) 25 MG tablet Take 1 tablet (25 mg total) by mouth daily.  . metoprolol tartrate (LOPRESSOR) 25 MG tablet TAKE 1 TABLET BY MOUTH TWICE A DAY  . Misc Natural Products (COLON CLEANSE PO) Take by mouth.  . montelukast (SINGULAIR) 10 MG tablet TAKE 1 TABLET BY MOUTH ATBEDTIME.  . nitroGLYCERIN (NITROSTAT) 0.4 MG SL tablet Place 0.4 mg under the tongue every 5 (five) minutes as needed.    Marland Kitchen  oxyCODONE (ROXICODONE) 15 MG immediate release tablet Take 15 mg by mouth every 4 (four) hours as needed.    Marland Kitchen Spacer/Aero-Holding Chambers (AEROCHAMBER MV) inhaler Use as instructed  . Testosterone (ANDROGEL PUMP) 20.25 MG/ACT (1.62%) GEL Place 2 Squirts onto the skin daily.    Marland Kitchen zolpidem (AMBIEN) 10 MG tablet Take 12.5 mg by mouth at bedtime as needed for sleep.     Allergies:   Patient has no known allergies.   Social History   Social History  . Marital status: Married    Spouse name: N/A  . Number of children: N/A  . Years of education: N/A   Occupational History  . transportation sales    Social History Main Topics  . Smoking status: Never Smoker  . Smokeless tobacco: Never Used  . Alcohol use 0.0 oz/week     Comment: Moderate, beer  . Drug use: No  . Sexual activity: Not Asked   Other Topics Concern  . None   Social History Narrative  . None      Family History:  The patient's family history includes COPD in his mother; Cancer in his father.   ROS:   Please see the history of present illness.    Review of Systems  Respiratory: Positive for wheezing.   Musculoskeletal: Positive for back pain.  Neurological: Positive for loss of balance.  Psychiatric/Behavioral: Positive for depression. The patient is nervous/anxious.    All other systems reviewed and are negative.  No flowsheet data found.     PHYSICAL EXAM:   VS:  BP 118/62   Pulse (!) 58   Ht 5\' 9"  (1.753 m)   Wt 256 lb 9.6 oz (116.4 kg)   SpO2 97%   BMI 37.89 kg/m    GEN: Well nourished, well developed, in no acute distress  HEENT: normal  Neck: no JVD, carotid bruits, or masses Cardiac: RRR; no murmurs, rubs, or gallops,no edema.  Intact distal pulses bilaterally.  Respiratory:  clear to auscultation bilaterally, normal work of breathing GI: soft, nontender, nondistended, + BS MS: no deformity or atrophy  Skin: warm and dry, no rash Neuro:  Alert and Oriented x 3, Strength and sensation are intact Psych: euthymic mood, full affect  Wt Readings from Last 3 Encounters:  01/20/17 256 lb 9.6 oz (116.4 kg)  05/04/16 250 lb (113.4 kg)  05/04/16 251 lb 3.2 oz (113.9 kg)      Studies/Labs Reviewed:   EKG:  EKG is not ordered today.    Recent Labs: 05/04/2016: ALT 12 05/28/2016: BUN 18; Creat 1.13; Potassium 4.7; Sodium 136   Lipid Panel    Component Value Date/Time   CHOL 114 (L) 05/04/2016 1029   TRIG 64 05/04/2016 1029   HDL 61 05/04/2016 1029   CHOLHDL 1.9 05/04/2016 1029   VLDL 13 05/04/2016 1029   LDLCALC 40 05/04/2016 1029   LDLDIRECT 33.0 05/07/2015 1233    Additional studies/ records that were reviewed today include:  none    ASSESSMENT:    1. Atherosclerosis of native coronary artery of native heart without angina pectoris   2. Essential hypertension, benign   3. Obstructive sleep apnea   4. Class 1 obesity due to excess calories  with serious comorbidity in adult, unspecified BMI   5. Dyslipidemia   6. DCM (dilated cardiomyopathy) ([place])      PLAN:  In order of problems listed above:  1. ASCAD - S/P STEMI anterior with PCI w BMS of LAD 04/12/2009 20% Prox RCA,  30 % mid RCA.  He has not had any anginal symptoms.   Continue ASA/statin/BB. 2. HTN - BP controlled on current meds. He will continue on BB and ARB.  I will check a BMET. 3.   OSA - the patient is tolerating PAP therapy well without any problems. The patient has been using and benefiting from CPAP use and will continue to benefit from therapy. I will get a download from his DME. 4.   Obesity - I have encouraged Bernard to get into a routine exercise program and cut back on carbs and portions.  5.   Dyslipidemia with LDL goal < 70.  He will continue on statin/fenofibrate.  Check FLP and ALT. 6.   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  He will continue on BB and ARB.    Medication Adjustments/Labs and Tests Ordered: Current medicines are reviewed at length with the patient today.  Concerns regarding medicines are outlined above.  Medication changes, Labs and Tests ordered today are listed in the Patient Instructions below.  There are no Patient Instructions on file for this visit.   Signed, Keith Him, MD  01/20/2017 1:34 PM    [facility] [address], [place], [place]  [address] Phone: [phone]; Fax: [phone]

## 2017-01-20 NOTE — Patient Instructions (Signed)
Medication Instructions:  Your physician recommends that you continue on your current medications as directed. Please refer to the Current Medication list given to you today.   Labwork: Fasting labs in 1 week: BMET, lipids, liver  Testing/Procedures: None  Follow-Up: Your physician wants you to follow-up in: 6 months with Dr. Theodosia Blender assistant. You will receive a reminder letter in the mail two months in advance. If you don't receive a letter, please call our office to schedule the follow-up appointment.   Your physician wants you to follow-up in: 1 year with Dr. Radford Pax. You will receive a reminder letter in the mail two months in advance. If you don't receive a letter, please call our office to schedule the follow-up appointment.   Any Other Special Instructions Will Be Listed Below (If Applicable).     If you need a refill on your cardiac medications before your next appointment, please call your pharmacy.

## 2017-01-20 NOTE — Addendum Note (Signed)
Addended by: Harland German A on: 01/20/2017 04:11 PM   Modules accepted: Orders

## 2017-01-21 ENCOUNTER — Telehealth: Payer: Self-pay | Admitting: *Deleted

## 2017-01-21 ENCOUNTER — Ambulatory Visit: Payer: Medicare Other | Admitting: Cardiology

## 2017-01-21 NOTE — Telephone Encounter (Signed)
-----   Message from Theodoro Parma, RN sent at 01/20/2017  3:32 PM EST ----- Regarding: download Can we get a download?  Thanks!!

## 2017-01-21 NOTE — Telephone Encounter (Signed)
Nevin Bloodgood at [facility] is faxing over a download on this patient 01/21/17

## 2017-01-22 ENCOUNTER — Other Ambulatory Visit: Payer: Medicare Other | Admitting: *Deleted

## 2017-01-22 DIAGNOSIS — E785 Hyperlipidemia, unspecified: Secondary | ICD-10-CM

## 2017-01-22 DIAGNOSIS — I10 Essential (primary) hypertension: Secondary | ICD-10-CM

## 2017-01-23 LAB — BASIC METABOLIC PANEL
BUN / CREAT RATIO: 9 — AB (ref 10–24)
BUN: 15 mg/dL (ref 8–27)
CO2: 26 mmol/L (ref 18–29)
CREATININE: 1.61 mg/dL — AB (ref 0.76–1.27)
Calcium: 9.1 mg/dL (ref 8.6–10.2)
Chloride: 94 mmol/L — ABNORMAL LOW (ref 96–106)
GFR, EST AFRICAN AMERICAN: 49 mL/min/{1.73_m2} — AB (ref >59)
GFR, EST NON AFRICAN AMERICAN: 42 mL/min/{1.73_m2} — AB (ref >59)
Glucose: 95 mg/dL (ref 65–99)
POTASSIUM: 5 mmol/L (ref 3.5–5.2)
SODIUM: 135 mmol/L (ref 134–144)

## 2017-01-23 LAB — HEPATIC FUNCTION PANEL
ALK PHOS: 48 IU/L (ref 39–117)
ALT: 15 IU/L (ref 0–44)
AST: 20 IU/L (ref 0–40)
Albumin: 4 g/dL (ref 3.5–4.8)
Bilirubin Total: 0.6 mg/dL (ref 0.0–1.2)
Bilirubin, Direct: 0.26 mg/dL (ref 0.00–0.40)
Total Protein: 6.6 g/dL (ref 6.0–8.5)

## 2017-01-23 LAB — LIPID PANEL
Chol/HDL Ratio: 2.3 ratio units (ref 0.0–5.0)
Cholesterol, Total: 122 mg/dL (ref 100–199)
HDL: 54 mg/dL (ref >39)
LDL Calculated: 49 mg/dL (ref 0–99)
TRIGLYCERIDES: 97 mg/dL (ref 0–149)
VLDL Cholesterol Cal: 19 mg/dL (ref 5–40)

## 2017-01-25 ENCOUNTER — Other Ambulatory Visit: Payer: Self-pay | Admitting: Emergency Medicine

## 2017-01-25 ENCOUNTER — Telehealth: Payer: Self-pay | Admitting: *Deleted

## 2017-01-25 DIAGNOSIS — I10 Essential (primary) hypertension: Secondary | ICD-10-CM

## 2017-01-25 MED ORDER — ALBUTEROL SULFATE HFA 108 (90 BASE) MCG/ACT IN AERS: 2.0000 | INHALATION_SPRAY | Freq: Four times a day (QID) | RESPIRATORY_TRACT | 1 refills | Status: DC | PRN

## 2017-01-25 NOTE — Telephone Encounter (Signed)
Pt notified of lab results and findings by phone. Pt advised to stop Losartan due to increased Creatinine. Pt agreeable to repeat BMET 3/6. Lab order placed and appt made.

## 2017-01-27 ENCOUNTER — Telehealth: Payer: Self-pay | Admitting: *Deleted

## 2017-01-27 NOTE — Telephone Encounter (Signed)
-----   Message from [name], MD sent at 01/26/2017 10:15 AM EST ----- Good AHI on CPAP but needs to improve compliance

## 2017-01-27 NOTE — Telephone Encounter (Signed)
Called the patient and spoke to his wife per DPR and gave her the results and recommendations to give her husband, she verbalized understanding and agreed and stated she would relay the message to her mr Whicker

## 2017-01-28 ENCOUNTER — Telehealth: Payer: Self-pay | Admitting: Internal Medicine

## 2017-01-28 NOTE — Telephone Encounter (Signed)
Spoke with pt she wanted [place] to start PAs to try to get pts medication to a lower tier. When I called [phone], someone had already started on the prior auth for both the [facility] and [employer] but they were still pending. Will forward to [place] to be on the look out for any further information if medication was approved or denial.    Fyi. Unsure of who started the prior auth did not see any documentation in the pt's chart.

## 2017-01-29 NOTE — Telephone Encounter (Signed)
Received fax from [facility] stating that tier exception request for Proair has been denied d/t the following reason: Proair HFA does not qualify for a tier exception; Proair is covered at a tier 3 copay, and tier 2 copay is only applicable to generic medications.   An appeal can be made by mailing a letter to  Part D Appeals and Grievances Department [place] [phone] [address] [place] [address]  Or fax letter to [phone]  No information received on Breo.  MR please advise if you'd like to proceed with appeal.  Thanks.

## 2017-02-01 NOTE — Telephone Encounter (Signed)
PA papers received for the PA for breo--this was denied due to a tiering exception.  MR please advise. thanks

## 2017-02-02 ENCOUNTER — Other Ambulatory Visit: Payer: Medicare Other

## 2017-02-03 ENCOUNTER — Other Ambulatory Visit: Payer: Medicare Other | Admitting: *Deleted

## 2017-02-03 DIAGNOSIS — I10 Essential (primary) hypertension: Secondary | ICD-10-CM

## 2017-02-03 DIAGNOSIS — I2510 Atherosclerotic heart disease of native coronary artery without angina pectoris: Secondary | ICD-10-CM

## 2017-02-03 NOTE — Addendum Note (Signed)
Addended by: Eulis Foster on: 02/03/2017 03:45 PM   Modules accepted: Orders

## 2017-02-04 LAB — BASIC METABOLIC PANEL
BUN / CREAT RATIO: 9 — AB (ref 10–24)
BUN: 26 mg/dL (ref 8–27)
CO2: 25 mmol/L (ref 18–29)
CREATININE: 3.01 mg/dL — AB (ref 0.76–1.27)
Calcium: 8.8 mg/dL (ref 8.6–10.2)
Chloride: 94 mmol/L — ABNORMAL LOW (ref 96–106)
GFR calc non Af Amer: 20 mL/min/{1.73_m2} — ABNORMAL LOW (ref >59)
GFR, EST AFRICAN AMERICAN: 23 mL/min/{1.73_m2} — AB (ref >59)
GLUCOSE: 79 mg/dL (ref 65–99)
Potassium: 4.3 mmol/L (ref 3.5–5.2)
SODIUM: 136 mmol/L (ref 134–144)

## 2017-02-05 ENCOUNTER — Other Ambulatory Visit: Payer: Self-pay

## 2017-02-05 MED ORDER — MONTELUKAST SODIUM 10 MG PO TABS: ORAL_TABLET | ORAL | 3 refills | Status: DC

## 2017-02-11 NOTE — Telephone Encounter (Signed)
MR please advise. Thanks! 

## 2017-02-13 ENCOUNTER — Other Ambulatory Visit: Payer: Self-pay | Admitting: Internal Medicine

## 2017-02-15 NOTE — Telephone Encounter (Signed)
MR please advise 

## 2017-02-15 NOTE — Telephone Encounter (Signed)
PA has been denied and will need an appeal or pt will need a change in medication.    MR please advise. Thanks.

## 2017-02-18 NOTE — Telephone Encounter (Signed)
Forwarding to ELise  Dr. Brand Males, M.D., [facility].C.P Pulmonary and Critical Care Medicine Staff Physician [place] [facility] Pager: [phone], If no answer or between  15:00h - 7:00h: call [phone]  02/18/2017 9:30 AM

## 2017-02-18 NOTE — Telephone Encounter (Signed)
MR please advise if you want to proceed with Appeal or if you would like to change the medication. thanks

## 2017-02-25 NOTE — Telephone Encounter (Signed)
Called and spoke to pt's wife. She states the pt has 3 month supply of Breo. Pt is in the process in getting pt assistance for [facility], he is not taking both.   MR please advise if you are with the pt continuing breo right now then change to [facility] once his Breo runs up. Thanks.

## 2017-03-02 NOTE — Telephone Encounter (Signed)
Pt aware of MR's below message and voiced her understanding. Nothing further needed.

## 2017-03-02 NOTE — Telephone Encounter (Signed)
Yes continue breo now and when it runs out start dulera ; that is fine  Dr. Brand Males, M.D., [facility].C.P Pulmonary and Critical Care Medicine Staff Physician [place] [facility] Pager: [phone], If no answer or between  15:00h - 7:00h: call [phone]  03/02/2017 6:41 AM

## 2017-04-06 ENCOUNTER — Other Ambulatory Visit: Payer: Self-pay | Admitting: Internal Medicine

## 2017-05-25 ENCOUNTER — Telehealth: Payer: Self-pay | Admitting: Internal Medicine

## 2017-05-25 MED ORDER — PREDNISONE 10 MG PO TABS: ORAL_TABLET | ORAL | 0 refills | Status: DC

## 2017-05-25 MED ORDER — DOXYCYCLINE HYCLATE 100 MG PO TABS: 100.0000 mg | ORAL_TABLET | Freq: Two times a day (BID) | ORAL | 0 refills | Status: DC

## 2017-05-25 NOTE — Telephone Encounter (Signed)
Send script for prednisone 10 mg >> 3 pills daily for 2 days, 2 pills daily for 2 days, 1 pill daily for 2 days  Send script for doxycycline 100 mg bid for 7 days.  Per last note from [name] he was prescribed dulera.  He should not be using both breo and dulera.  If he prefers breo than he can continue this, and stop dulera.  If he prefers dulera, then he should continue this and stop breo.

## 2017-05-25 NOTE — Telephone Encounter (Signed)
Spoke with pt. He is aware of VS recommendation. Pt was unaware that he should not be taking both Dulera and Breo. Pt states the will only take one of them from here on out but he could not tell me which one that would be. Rx has been sent in. Nothing further was needed.

## 2017-05-25 NOTE — Telephone Encounter (Signed)
Spoke with patient and his wife. He has had a hacking cough since last week. Productive cough with dark brown phlegm. States that he has this every year and MW usually just gives him prednisone and an abx. He stated that he has been using his albuterol, Breo 100, and Dulera 100 inhalers and still has the cough. Denied CP or SOB. Has not used any OTC medications at this point.   Pt wishes to use [facility] in [place].   VS, please advise since MR is not in the office. Thanks.

## 2017-06-07 ENCOUNTER — Telehealth: Payer: Self-pay | Admitting: Internal Medicine

## 2017-06-07 MED ORDER — PREDNISONE 10 MG PO TABS: ORAL_TABLET | ORAL | 0 refills | Status: DC

## 2017-06-07 MED ORDER — MOMETASONE FURO-FORMOTEROL FUM 100-5 MCG/ACT IN AERO: 2.0000 | INHALATION_SPRAY | Freq: Two times a day (BID) | RESPIRATORY_TRACT | 0 refills | Status: DC

## 2017-06-07 MED ORDER — AZITHROMYCIN 250 MG PO TABS: ORAL_TABLET | ORAL | 0 refills | Status: AC

## 2017-06-07 NOTE — Telephone Encounter (Signed)
Please have him take a longer prednisone taper as below Take 40mg  daily for 3 days, then 30mg  daily for 3 days, then 20mg  daily for 3 days, then 10mg  daily for 3 days, then stop  Azithromycin, z-pack

## 2017-06-07 NOTE — Telephone Encounter (Signed)
RB  Please Advise- As MR is unavailable   Spoke with pt and he stated he finished the prednisone and doxycycline that was called in by our office on  6/26. He states he felt like he was getting better while on the medication but now he is back worse. He is c/o wheezing a lot, he states he is sweating a little bit more,coughing with yellow mucus,increase sob, chest congestion,slight chest tightness. Pt states he is unsure if he had a fever. Denies body aches. He states he is currently using his Breo inhaler.   No Known Allergies Current Outpatient Prescriptions on File Prior to Visit  Medication Sig Dispense Refill  . albuterol (PROAIR HFA) 108 (90 Base) MCG/ACT inhaler Inhale 2 puffs into the lungs every 6 (six) hours as needed for wheezing or shortness of breath. 3 Inhaler 1  . aspirin 81 MG tablet Take 81 mg by mouth daily.      Marland Kitchen atorvastatin (LIPITOR) 20 MG tablet Take 1 tablet (20 mg total) by mouth daily. 90 tablet 3  . azithromycin (ZITHROMAX) 250 MG tablet Take 2 pills today then one a day for 4 additional days 6 tablet 0  . BREO ELLIPTA 100-25 MCG/INH AEPB USE 1 INHALATION DAILY 180 each 1  . doxycycline (VIBRA-TABS) 100 MG tablet Take 1 tablet (100 mg total) by mouth 2 (two) times daily. 14 tablet 0  . DULERA 100-5 MCG/ACT AERO INHALE 2 PUFFS INTO THE LUNGS 2 (TWO) TIMES DAILY. 13 g 0  . escitalopram (LEXAPRO) 20 MG tablet Take 20 mg by mouth daily.    Marland Kitchen esomeprazole (NEXIUM) 40 MG capsule Take 40 mg by mouth daily before breakfast.      . fenofibrate 160 MG tablet Take 1 tablet (160 mg total) by mouth daily. with food 90 tablet 3  . LORazepam (ATIVAN) 1 MG tablet Take 1 mg by mouth every 8 (eight) hours.    . metoprolol tartrate (LOPRESSOR) 25 MG tablet Take 1 tablet (25 mg total) by mouth 2 (two) times daily. 180 tablet 3  . Misc Natural Products (COLON CLEANSE PO) Take by mouth.    . montelukast (SINGULAIR) 10 MG tablet TAKE 1 TABLET BY MOUTH AT BEDTIME. 90 tablet 3  .  nitroGLYCERIN (NITROSTAT) 0.4 MG SL tablet Place 0.4 mg under the tongue every 5 (five) minutes as needed.      Marland Kitchen oxyCODONE (ROXICODONE) 15 MG immediate release tablet Take 15 mg by mouth every 4 (four) hours as needed.      . predniSONE (DELTASONE) 10 MG tablet 3 pills daily for 2 days, 2 pills daily for 2 days, 1 pill daily for 2 days 12 tablet 0  . Spacer/Aero-Holding Chambers (AEROCHAMBER MV) inhaler Use as instructed 1 each 0  . Testosterone (ANDROGEL PUMP) 20.25 MG/ACT (1.62%) GEL Place 2 Squirts onto the skin daily.      Marland Kitchen zolpidem (AMBIEN) 10 MG tablet Take 12.5 mg by mouth at bedtime as needed for sleep.      No current facility-administered medications on file prior to visit.

## 2017-06-07 NOTE — Telephone Encounter (Signed)
Pt is aware of Rb's recommendation's and voiced his understanding. Rx has been sent to preferred pharmacy. Pt also request sample of Dulera 100. Per 01-28-17 MR instructed pt to stop Breo and start Dulera 100. One sample of Dulera has been placed up front for pick up. Nothing further needed.

## 2017-07-06 ENCOUNTER — Telehealth: Payer: Self-pay | Admitting: Cardiology

## 2017-07-06 NOTE — Progress Notes (Signed)
Cardiology Office Note:    Date:  07/07/2017   ID:  Keith Bernard, DOB 02-23-1946, MRN [id number]  PCP:  [name], MD  Cardiologist:  Fransico Him, MD   Referring MD: [name], MD   Chief Complaint  Patient presents with  . Coronary Artery Disease  . Hypertension  . Cardiomyopathy    History of Present Illness:    Keith Bernard is a 71 y.o. 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 He is here today for followup and is doing fairly well. He denies any chest pain or pressure, dizziness, palpitations, PND, orthopnea or syncope.   He has chronic SOB from asthma and obesity and sees a pulmonologist who he is actually seeing today after this appt.  He has chronic LE edema from his neuropathy but this is stable and well controlled on diuretics.He is not sleeping well at night.  His wife states that he is having several hard alcoholic drinks nightly and he gets wired after drinking them.  Then his dogs start to act up and doesn't get to sleep. He is also watching TV late at night.   When he does sleep he uses his PAP and does not have any significant problems. He tolerates the nasal mask with chin strap and feels the pressure is adequate. He also cut his toe and now has a nonhealing wound and is being seen at the wound center.   Past Medical History:  Diagnosis Date  . Chronic pain   . 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 Depression   . Depression 05/04/2016  . DJD (degenerative joint disease)   . Dyslipidemia   . ETOH abuse   . Foot ulcer ([place]) 07/07/2017  . GERD (gastroesophageal reflux disease)   . Hypertension   . OSA (obstructive sleep apnea)    Moderate OSA w AHI 17.7/hr now on VPAP at  EEP at 7cm H2O, min PS 3cm H2O,max PS 15cm H2O  . RBBB     Past Surgical History:  Procedure Laterality Date  . CORONARY STENT PLACEMENT    . REPLACEMENT TOTAL KNEE     left knee    Current Medications: Current Meds  Medication Sig  . albuterol (PROAIR HFA) 108 (90 Base) MCG/ACT inhaler Inhale 2 puffs into the lungs every 6 (six) hours as needed for wheezing or shortness of breath.  Marland Kitchen aspirin 81 MG tablet Take 81 mg by mouth daily.    Marland Kitchen atorvastatin (LIPITOR) 20 MG tablet Take 1 tablet (20 mg total) by mouth daily.  . DULERA 100-5 MCG/ACT AERO INHALE 2 PUFFS INTO THE LUNGS 2 (TWO) TIMES DAILY.  Marland Kitchen escitalopram (LEXAPRO) 20 MG tablet Take 20 mg by mouth daily.  Marland Kitchen esomeprazole (NEXIUM) 40 MG capsule Take 40 mg by mouth daily before breakfast.    . fenofibrate 160 MG tablet Take 1 tablet (160 mg total) by mouth daily. with food  . LORazepam (ATIVAN) 1 MG tablet Take 1 mg by mouth every 8 (eight) hours.  . metoprolol tartrate (LOPRESSOR) 25 MG tablet Take 1 tablet (25 mg total) by mouth 2 (two) times daily.  . Misc Natural Products (COLON CLEANSE PO) Take by  mouth.  . montelukast (SINGULAIR) 10 MG tablet TAKE 1 TABLET BY MOUTH AT BEDTIME.  . nitroGLYCERIN (NITROSTAT) 0.4 MG SL tablet Place 0.4 mg under the tongue every 5 (five) minutes as needed.    Marland Kitchen oxyCODONE (ROXICODONE) 15 MG immediate release tablet Take 15 mg by mouth every 4 (four) hours as needed.    Marland Kitchen Spacer/Aero-Holding Chambers (AEROCHAMBER MV) inhaler Use as instructed  . tamsulosin (FLOMAX) 0.4 MG CAPS capsule Take 0.4 mg by mouth daily.   . Testosterone (ANDROGEL PUMP) 20.25 MG/ACT (1.62%) GEL Place 2 Squirts onto the skin daily.    Marland Kitchen zolpidem (AMBIEN) 10 MG tablet Take 12.5 mg by mouth at bedtime as needed for sleep.      Allergies:   Patient has no known allergies.   Social History   Social History  . Marital status: Married    Spouse name: N/A  . Number of children: N/A  . Years of education: N/A   Occupational  History  . transportation sales    Social History Main Topics  . Smoking status: Never Smoker  . Smokeless tobacco: Never Used  . Alcohol use 0.0 oz/week     Comment: Moderate, beer  . Drug use: No  . Sexual activity: Not Asked   Other Topics Concern  . None   Social History Narrative  . None     Family History: The patient's family history includes COPD in his mother; Cancer in his father.  ROS:   Please see the history of present illness.     All other systems reviewed and are negative.  EKGs/Labs/Other Studies Reviewed:    The following studies were reviewed today: CPAP download  EKG:  EKG is not ordered today.    Recent Labs: 01/22/2017: ALT 15 02/03/2017: BUN 26; Creatinine, Ser 3.01; Potassium 4.3; Sodium 136   Recent Lipid Panel    Component Value Date/Time   CHOL 122 01/22/2017 1240   TRIG 97 01/22/2017 1240   HDL 54 01/22/2017 1240   CHOLHDL 2.3 01/22/2017 1240   CHOLHDL 1.9 05/04/2016 1029   VLDL 13 05/04/2016 1029   LDLCALC 49 01/22/2017 1240   LDLDIRECT 33.0 05/07/2015 1233    Physical Exam:    VS:  BP 128/68   Pulse 60   Ht 5\' 9"  (1.753 m)   Wt 264 lb 9.6 oz (120 kg)   SpO2 93%   BMI 39.07 kg/m     Wt Readings from Last 3 Encounters:  07/07/17 264 lb 9.6 oz (120 kg)  01/20/17 256 lb 9.6 oz (116.4 kg)  05/04/16 250 lb (113.4 kg)     GEN:  Well nourished, well developed in no acute distress HEENT: Normal NECK: No JVD; No carotid bruits LYMPHATICS: No lymphadenopathy CARDIAC: RRR, no murmurs, rubs, gallops RESPIRATORY:  Clear to auscultation without rales, wheezing or rhonchi  ABDOMEN: Soft, non-tender, non-distended MUSCULOSKELETAL:  No edema; No deformity  SKIN: Warm and dry NEUROLOGIC:  Alert and oriented x 3 PSYCHIATRIC:  Normal affect   ASSESSMENT:    1. Atherosclerosis of native coronary artery of native heart without angina pectoris   2. Essential hypertension, benign   3. Obstructive sleep apnea   4. Class 2 severe  obesity due to excess calories with serious comorbidity and body mass index (BMI) of 35.0 to 35.9 in adult ([place])   5. Dyslipidemia   6. DCM (dilated cardiomyopathy) ([place])   7. Ulcer of right foot, unspecified ulcer stage ([place])    PLAN:  In order of problems listed above:  1.  ASCAD - S/P STEMI anterior with PCI w BMS of LAD 04/12/2009 20% Prox RCA, 30 % mid RCA.  He has done well with no anginal symptoms since his last OV.   He will continue ASA 81mg  daliy, atorvastatin 20mg  daily and lopressor 25mg  BID.  2.  HTN - His BP is well controlled on exam today. He will continue on lopressor 25mg  BID.  3.   OSA - the patient is tolerating PAP therapy well without any problems but has VERY POOR sleep hygiene. The PAP download was reviewed today and showed an AHI of 3.7/hr on auto H2O with only 8% compliance in using more than 4 hours nightly.  The patient has been using and benefiting from CPAP use and will continue to benefit from therapy. I had a long talk with [place] today about sleep hygiene.  I stressed the role that alcohol plays in causing sleep problems including insomnia and poor sleep maintenance.  I recommended that he slowly cut back to avoid withdrawal and eventually get off alcohol all together.  I also stressed the importance of getting the TV out of the bedroom.    4.   Obesity - I have encouraged him to continue in his routine walking program and cut back on carbs and portions.   5.   Dyslipidemia with LDL goal < 70.  He will continue on atorvastatin 20mg  daily and fenofibrate 160mg  daily. I will check an FLP and ALT.  6.   Ischemic/Nonischemic cardiomyopathy w/ component of ETOH induced CM - EF was 35% but now by echo ER 55% 04/2009.  He will continue on lopressor.  He had been on ACE I/ARB but this was stopped due to AKI.  Again I stress the role that continued ETOH use has on cardiotoxicity.  7.  Nonhealing right foot ulcer - he is being seen at the wound clinic.  I will set him up  for LE arterial dopplers to rule out PAD.   Medication Adjustments/Labs and Tests Ordered: Current medicines are reviewed at length with the patient today.  Concerns regarding medicines are outlined above.  No orders of the defined types were placed in this encounter.  No orders of the defined types were placed in this encounter.   Signed, Fransico Him, MD  07/07/2017 9:44 AM    [place]

## 2017-07-06 NOTE — Telephone Encounter (Signed)
New message      Pt is scheduled to see Dr Radford Pax tomorrow for a sleep follow up.  They did not take their "card" out [facility].  Can they bring the card to our office tomorrow and we download it?  Please call and let wife know

## 2017-07-06 NOTE — Telephone Encounter (Signed)
Reached out to the patient and confirmed he will bring his chip to his appointment on Wednesday.

## 2017-07-07 ENCOUNTER — Ambulatory Visit (INDEPENDENT_AMBULATORY_CARE_PROVIDER_SITE_OTHER): Payer: Medicare Other | Admitting: Cardiology

## 2017-07-07 ENCOUNTER — Encounter: Payer: Self-pay | Admitting: Cardiology

## 2017-07-07 ENCOUNTER — Ambulatory Visit (INDEPENDENT_AMBULATORY_CARE_PROVIDER_SITE_OTHER): Payer: Medicare Other | Admitting: Internal Medicine

## 2017-07-07 ENCOUNTER — Encounter: Payer: Self-pay | Admitting: Internal Medicine

## 2017-07-07 VITALS — BP 138/68 | HR 83 | Ht 69.0 in | Wt 265.0 lb

## 2017-07-07 VITALS — BP 128/68 | HR 60 | Ht 69.0 in | Wt 264.6 lb

## 2017-07-07 DIAGNOSIS — I2510 Atherosclerotic heart disease of native coronary artery without angina pectoris: Secondary | ICD-10-CM | POA: Diagnosis not present

## 2017-07-07 DIAGNOSIS — J4540 Moderate persistent asthma, uncomplicated: Secondary | ICD-10-CM

## 2017-07-07 DIAGNOSIS — Z6835 Body mass index (BMI) 35.0-35.9, adult: Secondary | ICD-10-CM | POA: Diagnosis not present

## 2017-07-07 DIAGNOSIS — L97519 Non-pressure chronic ulcer of other part of right foot with unspecified severity: Secondary | ICD-10-CM | POA: Diagnosis not present

## 2017-07-07 DIAGNOSIS — I420 Dilated cardiomyopathy: Secondary | ICD-10-CM

## 2017-07-07 DIAGNOSIS — E785 Hyperlipidemia, unspecified: Secondary | ICD-10-CM | POA: Diagnosis not present

## 2017-07-07 DIAGNOSIS — G4733 Obstructive sleep apnea (adult) (pediatric): Secondary | ICD-10-CM | POA: Diagnosis not present

## 2017-07-07 DIAGNOSIS — I10 Essential (primary) hypertension: Secondary | ICD-10-CM | POA: Diagnosis not present

## 2017-07-07 DIAGNOSIS — L97509 Non-pressure chronic ulcer of other part of unspecified foot with unspecified severity: Secondary | ICD-10-CM | POA: Insufficient documentation

## 2017-07-07 HISTORY — DX: Non-pressure chronic ulcer of other part of unspecified foot with unspecified severity: L97.509

## 2017-07-07 LAB — BASIC METABOLIC PANEL
BUN/Creatinine Ratio: 13 (ref 10–24)
BUN: 11 mg/dL (ref 8–27)
CALCIUM: 9 mg/dL (ref 8.6–10.2)
CO2: 24 mmol/L (ref 20–29)
CREATININE: 0.84 mg/dL (ref 0.76–1.27)
Chloride: 102 mmol/L (ref 96–106)
GFR, EST AFRICAN AMERICAN: 102 mL/min/{1.73_m2} (ref >59)
GFR, EST NON AFRICAN AMERICAN: 88 mL/min/{1.73_m2} (ref >59)
Glucose: 84 mg/dL (ref 65–99)
POTASSIUM: 4.5 mmol/L (ref 3.5–5.2)
Sodium: 141 mmol/L (ref 134–144)

## 2017-07-07 LAB — HEPATIC FUNCTION PANEL
ALBUMIN: 4.1 g/dL (ref 3.5–4.8)
ALT: 12 IU/L (ref 0–44)
AST: 20 IU/L (ref 0–40)
Alkaline Phosphatase: 54 IU/L (ref 39–117)
BILIRUBIN TOTAL: 0.8 mg/dL (ref 0.0–1.2)
BILIRUBIN, DIRECT: 0.33 mg/dL (ref 0.00–0.40)
Total Protein: 6.4 g/dL (ref 6.0–8.5)

## 2017-07-07 LAB — LIPID PANEL
CHOL/HDL RATIO: 1.9 ratio (ref 0.0–5.0)
Cholesterol, Total: 113 mg/dL (ref 100–199)
HDL: 61 mg/dL (ref >39)
LDL Calculated: 40 mg/dL (ref 0–99)
Triglycerides: 59 mg/dL (ref 0–149)
VLDL Cholesterol Cal: 12 mg/dL (ref 5–40)

## 2017-07-07 NOTE — Patient Instructions (Signed)
Medication Instructions:  Your physician recommends that you continue on your current medications as directed. Please refer to the Current Medication list given to you today.   Labwork: TODAY: BMET, LFTs, Lipids  Testing/Procedures: Your physician has requested that you have a lower extremity arterial duplex. During this test, ultrasound is used to evaluate arterial blood flow in the legs. Allow one hour for this exam. There are no restrictions or special instructions.  Follow-Up: Your physician wants you to follow-up in: 6 months with Dr. Radford Pax. You will receive a reminder letter in the mail two months in advance. If you don't receive a letter, please call our office to schedule the follow-up appointment.   Any Other Special Instructions Will Be Listed Below (If Applicable). Dr. Radford Pax has ordered new BiPAP supplies for you.    If you need a refill on your cardiac medications before your next appointment, please call your pharmacy.

## 2017-07-07 NOTE — Patient Instructions (Signed)
ICD-10-CM   1. Moderate persistent asthma without complication T02.40     The heat is contributing breath slight increase in symptoms but I'm glad you are without a flareup and without wheeze  PLAN -Continue Singulair daily -Continue Dulera daily as before 2 puffs 2 times daily  Take samples given though hours as a higher dose  Work with [employer] patient assistance Albuterol as needed Flu shot in the fall Please talk to PCP [name], MD -  and ensure you discuss   shingarix vaccine   Followup 6 months or sooner if needed  - ACQ and feno at followup

## 2017-07-07 NOTE — Progress Notes (Signed)
Subjective:     Patient ID: Keith Bernard, male   DOB: 07-31-1946, 71 y.o.   MRN: [id number]  HPI  OV 01/29/2016  Chief Complaint  Patient presents with  . Follow-up    Pt here after acute visit with SG on 2.10.17. Pt states his breathing has imrpoved since last OV. Pt states he has occassional wheezing and mild non prod cough. Pt denies CP/tightness.     [name] returns for chronic obstructive lung disease asthma follow-up. He is now on Dulera plus Singulair. He is compliant with Dulera. He says with improved compliance his symptoms are a lot better. His wife is here with him. Most recently he had a flareup and was treated with prednisone. I review the chart when he was seen by [name] gross my nurse practitioner. Currently he feels asthma is well controlled. However he still uses albuterol multiple times a day for rescue but denies any nocturnal symptoms or daytime symptoms. He is compliant with this CPAP for sleep apnea.  Review of the chart shows this is in December 2012 has elevated used to flows of 400 cells per cubic millimeter. I notice that and IgE was never checked. In any event subjectively is well controlled currently. Also noted on alpha-1 has not been checked.  Asthma control questionnaire shows that at night he hardly ever wakes up because of asthma. When he wakes up he has very mild symptoms. In the daytime he is moderately limited because of asthma although this could be because of obesity. He also has moderate shortness of breath because of asthma although this could be because of obesity. He is wheezing a lot of the time and uses albuterol 3-4 puffs on most days. Nevertheless average score of 2.2 show significant improvement after starting [place] 05/04/2016  Chief Complaint  Patient presents with  . Follow-up    Pt c/o occasional wheeze since not being on Breo for about a week. Pt has been using albuterol HFA more. Pt denies cough/SOB/CP/tightness.    Follow-up  asthma. Last seen March 2017. This is a routine follow-up. I put him on Breo when she really liked. However the cost dollars out of pocket because he is in the donut hole. So he ran out of it over a week ago and is having increased symptoms. Asthma control questionnaire is 2.0 and still within his range from prior visits. Exhaled nitric oxide is just over 25 suggests some amount of active airway inflammation. His wife is here with him and she says that she got approved for the [facility] patient assistance program but apparently between our office in the mailroom the paperwork has been loss. She is willing to redo the paperwork and she is wondering if I could sign my part of it this week.  In terms of symptoms specifically tells me that he does wake up a few times at night. When he wakes up he has very mild symptoms. Slightly limited in his activities because of asthma. He has moderate amount of shortness of breath with exertion. He does wheeze a little of the time and is using albuterol for rescue at least one or 2 times daily. 5 point score is 2/.0 showing activity - is worse this past week or so is within normal   OV 01/04/2017   Chief Complaint  Patient presents with  . Follow-up    Pt states last week he had an 'asthma attack' and went to ED in [place] and was given steroid  and abx per pt's wife. Pt states he feels he is improving. Pt states he does not feel back to baseline. Pt denies CP/tightness and f/c/s.     Follow-up moderate persistent asthma  Last seen June 2017. He presents with his wife as always. He is on Dulera through the assistance program. Some 3 weeks ago his sister died from terminal cancer. Take a flight to [place]. His been dealing with those emotions. Because of the travel, emotions and weather change some 10 days ago he had an asthma exacerbation but never below few days with increased cough and wheezing. He went to a local emergency department and got one shot of steroids  and was given 7 days of cephalexin which is completed. Despite this is not fully better. He still has some cough and wheeze and yellow sputum. There is no fever.   OV 07/07/2017  Chief Complaint  Patient presents with  . Follow-up    Pt using Dulera, still having trouble getting this covered by insurance and getting Financial Assistance/Patient Assistance. Pt states that his breathing is doing better after senc round of Abx/Prednisone.      Follow-up moderate persistent asthma   Last seen February 2018. He presents with his wife. Overall he is doing stable except with the summer heat his symptoms are slightly more than usual as seen in the asthma control questionnaire. He says that he does not wake up at night because of asthma. When he wakes up he has mild symptoms. He has limitation in his activities because of asthma but this could also be because of obesity and DJD and spine issues. He does get dyspneic for moderate amount of the time and is wheezing a lot of the time but using albuterol for rescue 1-2 puffs per day. He's having significant difficulty getting [employer] to pay for his assistance program and is in need of samples today. He continues on Singulair.   Asthma Control Panel -December 2012eos 400 cells per cubic millimeter \ - IgE not checked as of 05/04/2016 - Alpha 1 is MM 08/23/2015  01/29/2016  05/04/2016  07/07/2017   Current Med Regimen symbicort  Dulera  Off breo for several days.  On dulera 100. Struggling to afford and singulair  ACQ 5 point- 1 week. wtd avg score. <1.0 is good control 0.75-1.25 is grey zone. >1.25 poor control. Delta 0.5 is clinically meaningful 3.2 2.2 2.0 2.6  ACQ 7 point - 1 week. wtd avg score. <1.0 is good control 0.75-1.25 is grey zone. >1.25 poor control. Delta 0.5 is clinically meaningful x     ACT - a [employer] test - 4 week. Total score. Max is 25, Lower score is worse.  <19 = poor control x     FeNO ppB 34 x 26   FeV1  x  x   Planned intervention  for  visit Start dulera + singulair Cont dulera         has a past medical history of Chronic pain; Coronary artery disease; Depression; Depression (05/04/2016); DJD (degenerative joint disease); Dyslipidemia; ETOH abuse; Foot ulcer ([place]) (07/07/2017); GERD (gastroesophageal reflux disease); Hypertension; OSA (obstructive sleep apnea); and RBBB.   reports that he has never smoked. He has never used smokeless tobacco.  Past Surgical History:  Procedure Laterality Date  . CORONARY STENT PLACEMENT    . REPLACEMENT TOTAL KNEE     left knee    No Known Allergies  Immunization History  Administered Date(s) Administered  . DTaP 07/22/2001  .  Influenza Split 09/30/2014  . Influenza, High Dose Seasonal PF 11/04/2016  . Influenza,inj,Quad PF,36+ Mos 10/23/2015  . Pneumococcal Conjugate-13 01/02/2015, 10/01/2015  . Pneumococcal Polysaccharide-23 04/11/2012  . Tdap 12/22/2016  . Zoster 01/02/2015    Family History  Problem Relation Age of Onset  . COPD Mother   . Cancer Father        pancreatic      Current Outpatient Prescriptions:  .  albuterol (PROAIR HFA) 108 (90 Base) MCG/ACT inhaler, Inhale 2 puffs into the lungs every 6 (six) hours as needed for wheezing or shortness of breath., Disp: 3 Inhaler, Rfl: 1 .  aspirin 81 MG tablet, Take 81 mg by mouth daily.  , Disp: , Rfl:  .  atorvastatin (LIPITOR) 20 MG tablet, Take 1 tablet (20 mg total) by mouth daily., Disp: 90 tablet, Rfl: 3 .  DULERA 100-5 MCG/ACT AERO, INHALE 2 PUFFS INTO THE LUNGS 2 (TWO) TIMES DAILY., Disp: 13 g, Rfl: 0 .  escitalopram (LEXAPRO) 20 MG tablet, Take 20 mg by mouth daily., Disp: , Rfl:  .  esomeprazole (NEXIUM) 40 MG capsule, Take 40 mg by mouth daily before breakfast.  , Disp: , Rfl:  .  fenofibrate 160 MG tablet, Take 1 tablet (160 mg total) by mouth daily. with food, Disp: 90 tablet, Rfl: 3 .  LORazepam (ATIVAN) 1 MG tablet, Take 1 mg by mouth every 8 (eight) hours., Disp: , Rfl:  .  metoprolol tartrate  (LOPRESSOR) 25 MG tablet, Take 1 tablet (25 mg total) by mouth 2 (two) times daily., Disp: 180 tablet, Rfl: 3 .  Misc Natural Products (COLON CLEANSE PO), Take by mouth., Disp: , Rfl:  .  montelukast (SINGULAIR) 10 MG tablet, TAKE 1 TABLET BY MOUTH AT BEDTIME., Disp: 90 tablet, Rfl: 3 .  nitroGLYCERIN (NITROSTAT) 0.4 MG SL tablet, Place 0.4 mg under the tongue every 5 (five) minutes as needed.  , Disp: , Rfl:  .  oxyCODONE (ROXICODONE) 15 MG immediate release tablet, Take 15 mg by mouth every 4 (four) hours as needed.  , Disp: , Rfl:  .  Spacer/Aero-Holding Chambers (AEROCHAMBER MV) inhaler, Use as instructed, Disp: 1 each, Rfl: 0 .  tamsulosin (FLOMAX) 0.4 MG CAPS capsule, Take 0.4 mg by mouth daily. , Disp: , Rfl:  .  Testosterone (ANDROGEL PUMP) 20.25 MG/ACT (1.62%) GEL, Place 2 Squirts onto the skin daily.  , Disp: , Rfl:  .  zolpidem (AMBIEN) 10 MG tablet, Take 12.5 mg by mouth at bedtime as needed for sleep. , Disp: , Rfl:    Review of Systems     Objective:   Physical Exam  Constitutional: He is oriented to person, place, and time. He appears well-developed and well-nourished. No distress.  HENT:  Head: Normocephalic and atraumatic.  Right Ear: External ear normal.  Left Ear: External ear normal.  Mouth/Throat: Oropharynx is clear and moist. No oropharyngeal exudate.  Eyes: Pupils are equal, round, and reactive to light. Conjunctivae and EOM are normal. Right eye exhibits no discharge. Left eye exhibits no discharge. No scleral icterus.  Neck: Normal range of motion. Neck supple. No JVD present. No tracheal deviation present. No thyromegaly present.  Cardiovascular: Normal rate, regular rhythm and intact distal pulses.  Exam reveals no gallop and no friction rub.   No murmur heard. Pulmonary/Chest: Effort normal and breath sounds normal. No respiratory distress. He has no wheezes. He has no rales. He exhibits no tenderness.  Abdominal: Soft. Bowel sounds are normal. He exhibits no  distension and no mass. There is no tenderness. There is no rebound and no guarding.  Visceral obesity present  Musculoskeletal: Normal range of motion. He exhibits no edema or tenderness.  RT knee loader  Lymphadenopathy:    He has no cervical adenopathy.  Neurological: He is alert and oriented to person, place, and time. He has normal reflexes. No cranial nerve deficit. Coordination normal.  Skin: Skin is warm and dry. No rash noted. He is not diaphoretic. No erythema. No pallor.  Psychiatric: He has a normal mood and affect. His behavior is normal. Judgment and thought content normal.  Nursing note and vitals reviewed.      Vitals:   07/07/17 1229  BP: 138/68  Pulse: 83  SpO2: 95%  Weight: 265 lb (120.2 kg)  Height: 5\' 9"  (1.753 m)    Estimated body mass index is 39.13 kg/m as calculated from the following:   Height as of this encounter: 5\' 9"  (1.753 m).   Weight as of this encounter: 265 lb (120.2 kg).  Assessment:       ICD-10-CM   1. Moderate persistent asthma without complication G28.36        Plan:      The heat is contributing breath slight increase in symptoms but I'm glad you are without a flareup and without wheeze  PLAN -Continue Singulair daily -Continue Dulera daily as before 2 puffs 2 times daily  Take samples given though hours as a higher dose  Work with [employer] patient assistance Albuterol as needed Flu shot in the fall Please talk to PCP [name], MD -  and ensure you discuss   shingarix vaccine   Followup 6 months or sooner if needed  - ACQ and feno at followup    Dr. Brand Males, M.D., [facility].C.P Pulmonary and Critical Care Medicine Staff Physician [facility] Pager: [phone], If no answer or between  15:00h - 7:00h: call [phone]  07/07/2017 12:52 PM

## 2017-07-20 ENCOUNTER — Other Ambulatory Visit: Payer: Self-pay | Admitting: Cardiology

## 2017-07-20 DIAGNOSIS — L97519 Non-pressure chronic ulcer of other part of right foot with unspecified severity: Secondary | ICD-10-CM

## 2017-07-28 ENCOUNTER — Ambulatory Visit (HOSPITAL_COMMUNITY): Payer: Medicare Other

## 2017-08-05 ENCOUNTER — Ambulatory Visit (HOSPITAL_COMMUNITY)
Admission: RE | Admit: 2017-08-05 | Discharge: 2017-08-05 | Disposition: A | Discharge status: Home / Self Care | Payer: Medicare Other | Source: Ambulatory Visit | Attending: Internal Medicine | Admitting: Internal Medicine

## 2017-08-05 DIAGNOSIS — I2510 Atherosclerotic heart disease of native coronary artery without angina pectoris: Secondary | ICD-10-CM | POA: Diagnosis not present

## 2017-08-05 DIAGNOSIS — E785 Hyperlipidemia, unspecified: Secondary | ICD-10-CM | POA: Insufficient documentation

## 2017-08-05 DIAGNOSIS — I739 Peripheral vascular disease, unspecified: Secondary | ICD-10-CM | POA: Insufficient documentation

## 2017-08-05 DIAGNOSIS — I10 Essential (primary) hypertension: Secondary | ICD-10-CM | POA: Insufficient documentation

## 2017-08-05 DIAGNOSIS — L97519 Non-pressure chronic ulcer of other part of right foot with unspecified severity: Secondary | ICD-10-CM

## 2017-10-12 ENCOUNTER — Telehealth: Payer: Self-pay | Admitting: Internal Medicine

## 2017-10-12 MED ORDER — ALBUTEROL SULFATE HFA 108 (90 BASE) MCG/ACT IN AERS
2.0000 | INHALATION_SPRAY | Freq: Four times a day (QID) | RESPIRATORY_TRACT | 3 refills | Status: DC | PRN
Start: 2017-10-12 — End: 2018-01-11

## 2017-10-12 NOTE — Telephone Encounter (Signed)
Advised pt that we do not have any samples of ProAir. The wife requested a RX to be sent to [facility]. Rx sent and nothing further needed.

## 2017-10-28 ENCOUNTER — Encounter: Payer: Self-pay | Admitting: Hematology and Oncology

## 2017-10-28 ENCOUNTER — Telehealth: Payer: Self-pay | Admitting: Hematology and Oncology

## 2017-10-28 ENCOUNTER — Ambulatory Visit (HOSPITAL_BASED_OUTPATIENT_CLINIC_OR_DEPARTMENT_OTHER): Payer: Medicare Other | Admitting: Hematology and Oncology

## 2017-10-28 ENCOUNTER — Ambulatory Visit (HOSPITAL_COMMUNITY)
Admission: RE | Admit: 2017-10-28 | Discharge: 2017-10-28 | Disposition: A | Discharge status: Home / Self Care | Payer: Medicare Other | Source: Ambulatory Visit | Attending: Hematology and Oncology | Admitting: Hematology and Oncology

## 2017-10-28 ENCOUNTER — Ambulatory Visit (HOSPITAL_BASED_OUTPATIENT_CLINIC_OR_DEPARTMENT_OTHER): Payer: Medicare Other

## 2017-10-28 ENCOUNTER — Telehealth: Payer: Self-pay

## 2017-10-28 VITALS — BP 104/80 | HR 77 | Temp 98.5°F | Resp 18 | Ht 69.0 in | Wt 266.2 lb

## 2017-10-28 DIAGNOSIS — Z7901 Long term (current) use of anticoagulants: Secondary | ICD-10-CM | POA: Insufficient documentation

## 2017-10-28 DIAGNOSIS — Z96653 Presence of artificial knee joint, bilateral: Secondary | ICD-10-CM | POA: Insufficient documentation

## 2017-10-28 DIAGNOSIS — D472 Monoclonal gammopathy: Secondary | ICD-10-CM

## 2017-10-28 DIAGNOSIS — W19XXXA Unspecified fall, initial encounter: Secondary | ICD-10-CM

## 2017-10-28 DIAGNOSIS — R51 Headache: Secondary | ICD-10-CM | POA: Insufficient documentation

## 2017-10-28 DIAGNOSIS — M47816 Spondylosis without myelopathy or radiculopathy, lumbar region: Secondary | ICD-10-CM | POA: Insufficient documentation

## 2017-10-28 DIAGNOSIS — I7090 Unspecified atherosclerosis: Secondary | ICD-10-CM | POA: Diagnosis not present

## 2017-10-28 DIAGNOSIS — N183 Chronic kidney disease, stage 3 (moderate): Secondary | ICD-10-CM | POA: Diagnosis not present

## 2017-10-28 DIAGNOSIS — M47812 Spondylosis without myelopathy or radiculopathy, cervical region: Secondary | ICD-10-CM | POA: Diagnosis not present

## 2017-10-28 LAB — COMPREHENSIVE METABOLIC PANEL
ALT: 15 U/L (ref 0–55)
ANION GAP: 10 meq/L (ref 3–11)
AST: 23 U/L (ref 5–34)
Albumin: 3.8 g/dL (ref 3.5–5.0)
Alkaline Phosphatase: 60 U/L (ref 40–150)
BUN: 16.3 mg/dL (ref 7.0–26.0)
CALCIUM: 9.3 mg/dL (ref 8.4–10.4)
CHLORIDE: 103 meq/L (ref 98–109)
CO2: 26 mEq/L (ref 22–29)
CREATININE: 1 mg/dL (ref 0.7–1.3)
EGFR: >60 mL/min/{1.73_m2} (ref >60)
Glucose: 99 mg/dl (ref 70–140)
POTASSIUM: 4.3 meq/L (ref 3.5–5.1)
Sodium: 139 mEq/L (ref 136–145)
Total Bilirubin: 0.91 mg/dL (ref 0.20–1.20)
Total Protein: 7.2 g/dL (ref 6.4–8.3)

## 2017-10-28 LAB — CBC WITH DIFFERENTIAL/PLATELET
BASO%: 0.4 % (ref 0.0–2.0)
Basophils Absolute: 0 10*3/uL (ref 0.0–0.1)
EOS ABS: 0.1 10*3/uL (ref 0.0–0.5)
EOS%: 1.1 % (ref 0.0–7.0)
HCT: 43.8 % (ref 38.4–49.9)
HEMOGLOBIN: 14.1 g/dL (ref 13.0–17.1)
LYMPH%: 16.8 % (ref 14.0–49.0)
MCH: 33 pg (ref 27.2–33.4)
MCHC: 32.2 g/dL (ref 32.0–36.0)
MCV: 102.6 fL — AB (ref 79.3–98.0)
MONO#: 1 10*3/uL — AB (ref 0.1–0.9)
MONO%: 12.2 % (ref 0.0–14.0)
NEUT%: 69.5 % (ref 39.0–75.0)
NEUTROS ABS: 5.6 10*3/uL (ref 1.5–6.5)
Platelets: 201 10*3/uL (ref 140–400)
RBC: 4.27 10*6/uL (ref 4.20–5.82)
RDW: 13.5 % (ref 11.0–14.6)
WBC: 8 10*3/uL (ref 4.0–10.3)
lymph#: 1.4 10*3/uL (ref 0.9–3.3)

## 2017-10-28 LAB — LACTATE DEHYDROGENASE: LDH: 208 U/L (ref 125–245)

## 2017-10-28 NOTE — Telephone Encounter (Signed)
STAT CT and humerus x ray ordered by Dr. Lebron Conners. Called [facility] and made them aware. Was informed that patient could come on over. Patient aware to go to radiology after getting labs drawn.

## 2017-10-28 NOTE — Telephone Encounter (Signed)
Gave avs and calendar for December  °

## 2017-10-29 LAB — BETA 2 MICROGLOBULIN, SERUM: Beta-2: 2.5 mg/L — ABNORMAL HIGH (ref 0.6–2.4)

## 2017-11-03 ENCOUNTER — Telehealth: Payer: Self-pay | Admitting: Internal Medicine

## 2017-11-03 MED ORDER — MOMETASONE FURO-FORMOTEROL FUM 100-5 MCG/ACT IN AERO: INHALATION_SPRAY | RESPIRATORY_TRACT | 0 refills | Status: DC

## 2017-11-03 NOTE — Telephone Encounter (Signed)
No Dulera samples  Spoke with pt, advised no samples of Dulera. He would like [place] send a Rx to [facility] on [address]. Rx sent.

## 2017-11-04 ENCOUNTER — Encounter: Payer: Self-pay | Admitting: Hematology and Oncology

## 2017-11-04 ENCOUNTER — Telehealth: Payer: Self-pay | Admitting: Hematology and Oncology

## 2017-11-04 ENCOUNTER — Ambulatory Visit (HOSPITAL_BASED_OUTPATIENT_CLINIC_OR_DEPARTMENT_OTHER): Payer: Medicare Other | Admitting: Hematology and Oncology

## 2017-11-04 VITALS — BP 122/70 | HR 65 | Temp 97.8°F | Resp 17 | Ht 69.0 in | Wt 275.6 lb

## 2017-11-04 DIAGNOSIS — R5383 Other fatigue: Secondary | ICD-10-CM

## 2017-11-04 DIAGNOSIS — D472 Monoclonal gammopathy: Secondary | ICD-10-CM | POA: Diagnosis present

## 2017-11-04 DIAGNOSIS — D7589 Other specified diseases of blood and blood-forming organs: Secondary | ICD-10-CM

## 2017-11-04 NOTE — Telephone Encounter (Signed)
Gave avs and calendar for February 2019 °

## 2017-11-04 NOTE — Telephone Encounter (Signed)
Referral order is not showing yet

## 2017-11-15 ENCOUNTER — Telehealth: Payer: Self-pay | Admitting: Cardiology

## 2017-11-15 NOTE — Telephone Encounter (Signed)
Patient wife calling, states that she would like to make sure it's okay for patient to start Adderall medication?

## 2017-11-15 NOTE — Telephone Encounter (Signed)
With history of CAD I prefer that he not go on Adderal

## 2017-11-15 NOTE — Telephone Encounter (Signed)
Spoke with patient's wife(DPR on file). She states that Dr. Chucky May, psychiatrist would like to start patient on Adderall to help with energy, and is requesting clearance from cardiologist before starting. Informed her I would send to Dr. Radford Pax to review. She verbalized understanding and thanked me for the call.

## 2017-11-17 NOTE — Telephone Encounter (Signed)
Spoke with patient's spouse (DPR on file). Informed her that per Dr. Radford Pax she prefers that patient does not go on Adderal. She verbalized understanding and thanked me for the call.

## 2017-11-19 ENCOUNTER — Other Ambulatory Visit: Payer: Self-pay | Admitting: Cardiology

## 2017-12-03 ENCOUNTER — Encounter: Payer: Self-pay | Admitting: Hematology and Oncology

## 2017-12-03 NOTE — Assessment & Plan Note (Signed)
72 y.o. male with multiple chronic medical conditions including chronic kidney disease and peripheral neuropathy confined to right lower extremity who was found to have a low-level monoclonal gammopathy of unknown significance in March 2018.  Follow-up testing and straight presence of IgG lambda monoclonal protein with level stable as of July 2018.  Patient does not have anemia, kidney function appears to be reasonably stable, no hypercalcemia.  She does have skeletal complaints but no history of lytic lesions or pathological fractures noted.  Interest, patient does have family history of amyloidosis in a paternal cousin.  Plan: --Repeat lab work today as outlined below to update hematological profile and protein levels. --Skeletal survey --CT head without contrast and x-ray of the left shoulder/arm due to recent fall. --Return to clinic in 1 week to review the findings.

## 2017-12-03 NOTE — Progress Notes (Signed)
[name]:  Assessment: MGUS (monoclonal gammopathy of unknown significance) 72 y.o. male with multiple chronic medical conditions including chronic kidney disease and peripheral neuropathy confined to right lower extremity who was found to have a low-level monoclonal gammopathy of unknown significance in March 2018.  Follow-up testing and straight presence of IgG lambda monoclonal protein with level stable as of July 2018.  Patient does not have anemia, kidney function appears to be reasonably stable, no hypercalcemia.  She does have skeletal complaints but no history of lytic lesions or pathological fractures noted.  Interest, patient does have family history of amyloidosis in a paternal cousin.  Plan: --Repeat lab work today as outlined below to update hematological profile and protein levels. --Skeletal survey --CT head without contrast and x-ray of the left shoulder/arm due to recent fall. --Return to clinic in 1 week to review the findings. Voice recognition software was used and creation of this note. Despite my best effort at editing the text, some misspelling/errors may have occurred.  Orders Placed This Encounter  Procedures  . CT Head Wo Contrast    Standing Status:   Future    Number of Occurrences:   1    Standing Expiration Date:   10/28/2018    Order Specific Question:   Preferred imaging location?    Answer:   [facility]    Order Specific Question:   Call Results- Best Contact Number?    Answer:   [phone]    Order Specific Question:   Radiology Contrast Protocol - do NOT remove file path    Answer:   [email or web address]    Order Specific Question:   Reason for Exam additional comments    Answer:   Patient on anticoagulation, fall yesterday did not seek care sofar. Please eval for ICH  . DG Humerus Left    Standing Status:   Future    Number of Occurrences:   1    Standing Expiration Date:    10/28/2018    Order Specific Question:   Reason for Exam (SYMPTOM  OR DIAGNOSIS REQUIRED)    Answer:   Fall yesterday, persistent pain and some use limitation without deformity, please eval for fracture    Order Specific Question:   Preferred imaging location?    Answer:   [facility]    Order Specific Question:   Radiology Contrast Protocol - do NOT remove file path    Answer:   [email or web address]  . DG Bone Survey Met    Standing Status:   Future    Number of Occurrences:   1    Standing Expiration Date:   12/28/2018    Order Specific Question:   Reason for Exam (SYMPTOM  OR DIAGNOSIS REQUIRED)    Answer:   MGUS, please eval for lytic lesions    Order Specific Question:   Preferred imaging location?    Answer:   [facility]    Order Specific Question:   Radiology Contrast Protocol - do NOT remove file path    Answer:   [email or web address]  . CBC with Differential    Standing Status:   Future    Number of Occurrences:   1    Standing Expiration Date:   10/28/2018  . Comprehensive metabolic panel    Standing Status:   Future    Number of Occurrences:   1    Standing Expiration Date:   10/28/2018  . Lactate dehydrogenase (LDH)    Standing  Status:   Future    Number of Occurrences:   1    Standing Expiration Date:   10/28/2018  . Beta 2 microglobulin    Standing Status:   Future    Number of Occurrences:   1    Standing Expiration Date:   10/28/2018    All questions were answered.  . The patient knows to call the clinic with any problems, questions or concerns.  This note was electronically signed.    History of Presenting Illness Keith Bernard 72 y.o. presenting to the [facility] for monoclonal gammopathy of unknown significance (MGUS), referred by Dr Rexene Agent.  Please see hematological/oncological history below for details.  His past medical history  significant for coronary artery disease with previous PCI, obesity, history of right total knee replacement, history of right lower extremity neuropathy with drop food development, asthma, obstructive sleep apnea with intermittent use of CPAP, chronic kidney disease, stage III.,  Patient's family history is significant for paternal cousin with history of amyloidosis.  At the present time, patient complains of persistent fatigue.  Patient did have a bout of night sweats 2-3 months ago, but those have resolved.  His activity level has been significantly decreased and he has gained some weight which he attributes to inactivity.  Patient denies chest pain, shortness of breath, or cough.  No nausea, vomiting, abdominal pain, diarrhea, or constipation.  No dysuria or hematuria.  Most recently, patient had an echocardiogram obtained 4-5 months ago.  Note, patient had a recent fall in the grocery store yesterday.  Denies hitting his head, complains of pain in the left shoulder and arm.  He did not seek medical care following the fall.  Oncological/hematological History: --Labs, 02/22/17: SPEP -- M-Spike 0.2g/dL, SIFE -- positive for IgG lambda monoclonal protein; IgG 964, IgA 251, IgM 35; kappa 23.7, lambda 20.6, KLR 1.15;  --Labs, 06/22/17: SPEP -- M-Spike 0.2g/dL, SIFE -- positive for IgG lambda monoclonal protein; IgG 946, IgA 222, IgM 44; kappa 17.0, lambda 17.5, KLR 0.97; WBC 7.1, Hgb 14.9, Plt 226; Cr 0.94, Ca 9.8 --Labs, 09/16/17: WBC 7.4, Hgb 13.7, Plt 191  Medical History: Past Medical History:  Diagnosis Date  . Asthma   . Chronic kidney disease (CKD), stage III (moderate) (HCC)   . Chronic pain   . 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 Depression 05/04/2016  . DJD (degenerative joint disease)   . Dyslipidemia   . ETOH abuse   . Foot  ulcer ([place]) 07/07/2017  . GERD (gastroesophageal reflux disease)   . Hypertension   . Neuropathy   . Obesity   . OSA (obstructive sleep apnea)    Moderate OSA w AHI 17.7/hr now on VPAP at EEP at 7cm H2O, min PS 3cm H2O,max PS 15cm H2O  . RBBB     Surgical History: Past Surgical History:  Procedure Laterality Date  . CORONARY STENT PLACEMENT    . REPLACEMENT TOTAL KNEE     left knee    Family History: Family History  Problem Relation Age of Onset  . COPD Mother   . Pancreatic cancer Father        pancreatic   . Other Cousin        Amyloidosis    Social History: Social History  Socioeconomic History  . Marital status: Married    Spouse name: Not on file  . Number of children: Not on file  . Years of education: Not on file  . Highest education level: Not on file  Social Needs  . Financial resource strain: Not on file  . Food insecurity - worry: Not on file  . Food insecurity - inability: Not on file  . Transportation needs - medical: Not on file  . Transportation needs - non-medical: Not on file  Occupational History  . Occupation: Risk manager  Tobacco Use  . Smoking status: Never Smoker  . Smokeless tobacco: Never Used  Substance and Sexual Activity  . Alcohol use: Yes    Alcohol/week: 0.0 oz    Comment: Moderate, beer  . Drug use: No  . Sexual activity: Not on file  Other Topics Concern  . Not on file  Social History Narrative  . Not on file    Allergies: No Known Allergies  Medications:  Current Outpatient Medications  Medication Sig Dispense Refill  . albuterol (PROAIR HFA) 108 (90 Base) MCG/ACT inhaler Inhale 2 puffs every 6 (six) hours as needed into the lungs for wheezing or shortness of breath. 1 Inhaler 3  . aspirin 81 MG tablet Take 81 mg by mouth daily.      Marland Kitchen atorvastatin (LIPITOR) 20 MG tablet TAKE 1 TABLET BY MOUTH  DAILY 90 tablet 1  . escitalopram (LEXAPRO) 20 MG tablet Take 20 mg by mouth daily.    Marland Kitchen esomeprazole (NEXIUM) 40  MG capsule Take 40 mg by mouth daily before breakfast.      . fenofibrate 160 MG tablet TAKE 1 TABLET BY MOUTH  DAILY WITH FOOD 90 tablet 1  . LORazepam (ATIVAN) 1 MG tablet Take 1 mg by mouth every 8 (eight) hours.    . metoprolol tartrate (LOPRESSOR) 25 MG tablet Take 1 tablet (25 mg total) by mouth 2 (two) times daily. 180 tablet 3  . Misc Natural Products (COLON CLEANSE PO) Take by mouth.    . mometasone-formoterol (DULERA) 100-5 MCG/ACT AERO INHALE 2 PUFFS INTO THE LUNGS 2 (TWO) TIMES DAILY. 13 g 0  . montelukast (SINGULAIR) 10 MG tablet TAKE 1 TABLET BY MOUTH AT BEDTIME. 90 tablet 3  . nitroGLYCERIN (NITROSTAT) 0.4 MG SL tablet Place 0.4 mg under the tongue every 5 (five) minutes as needed.      Marland Kitchen oxyCODONE (ROXICODONE) 15 MG immediate release tablet Take 15 mg by mouth every 4 (four) hours as needed.      Marland Kitchen Spacer/Aero-Holding Chambers (AEROCHAMBER MV) inhaler Use as instructed 1 each 0  . tamsulosin (FLOMAX) 0.4 MG CAPS capsule Take 0.4 mg by mouth daily.     . Testosterone (ANDROGEL PUMP) 20.25 MG/ACT (1.62%) GEL Place 2 Squirts onto the skin daily.      Marland Kitchen zolpidem (AMBIEN) 10 MG tablet Take 12.5 mg by mouth at bedtime as needed for sleep.      No current facility-administered medications for this visit.     Review of Systems: Review of Systems  Constitutional: Positive for diaphoresis, fatigue and unexpected weight change.  All other systems reviewed and are negative.    PHYSICAL EXAMINATION Blood pressure 104/80, pulse 77, temperature 98.5 F (36.9 C), temperature source Oral, resp. rate 18, height 5' 9"  (1.753 m), weight 266 lb 3.2 oz (120.7 kg), SpO2 94 %.  ECOG PERFORMANCE STATUS: 2 - Symptomatic, <50% confined to bed  Physical Exam  Constitutional: He is oriented to person,  place, and time and well-developed, well-nourished, and in no distress. No distress.  HENT:  Head: Normocephalic and atraumatic.  Mouth/Throat: Oropharynx is clear and moist. No oropharyngeal  exudate.  Eyes: Conjunctivae and EOM are normal. Pupils are equal, round, and reactive to light. No scleral icterus.  Neck: No thyromegaly present.  Cardiovascular: Normal rate, regular rhythm and normal heart sounds.  No murmur heard. Pulmonary/Chest: Effort normal and breath sounds normal. No respiratory distress. He has no wheezes. He has no rales.  Abdominal: Soft. Bowel sounds are normal. He exhibits no distension. There is no tenderness. There is no rebound and no guarding.  Musculoskeletal: He exhibits no edema.  Lymphadenopathy:    He has no cervical adenopathy.  Neurological: He is alert and oriented to person, place, and time. He has normal reflexes. No cranial nerve deficit.  Skin: Skin is warm and dry. No rash noted. He is not diaphoretic. No erythema. No pallor.     LABORATORY DATA: I have personally reviewed the data as listed: Appointment on 10/28/2017  Component Date Value Ref Range Status  . Beta-2 10/28/2017 2.5* 0.6 - 2.4 mg/L Final   Siemens Immulite 2000 Immunochemiluminometric assay (ICMA)  . LDH 10/28/2017 208  125 - 245 U/L Final  . Sodium 10/28/2017 139  136 - 145 mEq/L Final  . Potassium 10/28/2017 4.3  3.5 - 5.1 mEq/L Final  . Chloride 10/28/2017 103  98 - 109 mEq/L Final  . CO2 10/28/2017 26  22 - 29 mEq/L Final  . Glucose 10/28/2017 99  70 - 140 mg/dl Final   Glucose reference range is for nonfasting patients. Fasting glucose reference range is 70- 100.  Marland Kitchen BUN 10/28/2017 16.3  7.0 - 26.0 mg/dL Final  . Creatinine 10/28/2017 1.0  0.7 - 1.3 mg/dL Final  . Total Bilirubin 10/28/2017 0.91  0.20 - 1.20 mg/dL Final  . Alkaline Phosphatase 10/28/2017 60  40 - 150 U/L Final  . AST 10/28/2017 23  5 - 34 U/L Final  . ALT 10/28/2017 15  0 - 55 U/L Final  . Total Protein 10/28/2017 7.2  6.4 - 8.3 g/dL Final  . Albumin 10/28/2017 3.8  3.5 - 5.0 g/dL Final  . Calcium 10/28/2017 9.3  8.4 - 10.4 mg/dL Final  . Anion Gap 10/28/2017 10  3 - 11 mEq/L Final  . EGFR  10/28/2017 >60  >60 ml/min/1.73 m2 Final   eGFR is calculated using the CKD-EPI Creatinine Equation (2009)  . WBC 10/28/2017 8.0  4.0 - 10.3 10e3/uL Final  . NEUT# 10/28/2017 5.6  1.5 - 6.5 10e3/uL Final  . HGB 10/28/2017 14.1  13.0 - 17.1 g/dL Final  . HCT 10/28/2017 43.8  38.4 - 49.9 % Final  . Platelets 10/28/2017 201  140 - 400 10e3/uL Final  . MCV 10/28/2017 102.6* 79.3 - 98.0 fL Final  . MCH 10/28/2017 33.0  27.2 - 33.4 pg Final  . MCHC 10/28/2017 32.2  32.0 - 36.0 g/dL Final  . RBC 10/28/2017 4.27  4.20 - 5.82 10e6/uL Final  . RDW 10/28/2017 13.5  11.0 - 14.6 % Final  . lymph# 10/28/2017 1.4  0.9 - 3.3 10e3/uL Final  . MONO# 10/28/2017 1.0* 0.1 - 0.9 10e3/uL Final  . Eosinophils Absolute 10/28/2017 0.1  0.0 - 0.5 10e3/uL Final  . Basophils Absolute 10/28/2017 0.0  0.0 - 0.1 10e3/uL Final  . NEUT% 10/28/2017 69.5  39.0 - 75.0 % Final  . LYMPH% 10/28/2017 16.8  14.0 - 49.0 % Final  . MONO%  10/28/2017 12.2  0.0 - 14.0 % Final  . EOS% 10/28/2017 1.1  0.0 - 7.0 % Final  . BASO% 10/28/2017 0.4  0.0 - 2.0 % Final         Ardath Sax, MD

## 2017-12-05 NOTE — Assessment & Plan Note (Addendum)
72 y.o. male with multiple chronic medical conditions including chronic kidney disease and peripheral neuropathy confined to right lower extremity who was found to have a low-level monoclonal gammopathy of unknown significance in March 2018.  Follow-up testing and straight presence of IgG lambda monoclonal protein with level stable as of July 2018.  Patient does not have anemia, kidney function appears to be reasonably stable, no hypercalcemia.  Additional evaluation reveals no evidence of progressive anemia, or thrombocytopenia.  Renal function is stable without evidence of hypercalcemia.  Skeletal survey negative for lytic skeletal lesions.  At this point, if patient has multiple myeloma with appears to be a smoldering and will not require therapy as of now.  Patient does have some macrocytosis present in his peripheral blood and additional evaluation for that is warranted.  Plan: -- Initiate observation for monoclonal gammopathy of unknown significance  due to potential for transformation into multiple myeloma -- Return to clinic in 2 months with lab work including full protein panel, B12, folate, and TSH -- Consult neurology for complaints of tremors and memory difficulties

## 2017-12-05 NOTE — Progress Notes (Signed)
[facility] Follow-up Visit:  Assessment: MGUS (monoclonal gammopathy of unknown significance) 71 y.o. male with multiple chronic medical conditions including chronic kidney disease and peripheral neuropathy confined to right lower extremity who was found to have a low-level monoclonal gammopathy of unknown significance in March 2018.  Follow-up testing and straight presence of IgG lambda monoclonal protein with level stable as of July 2018.  Patient does not have anemia, kidney function appears to be reasonably stable, no hypercalcemia.  Additional evaluation reveals no evidence of progressive anemia, or thrombocytopenia.  Renal function is stable without evidence of hypercalcemia.  Skeletal survey negative for lytic skeletal lesions.  At this point, if patient has multiple myeloma with appears to be a smoldering and will not require therapy as of now.  Patient does have some macrocytosis present in his peripheral blood and additional evaluation for that is warranted.  Plan: -- Initiate observation for monoclonal gammopathy of unknown significance  due to potential for transformation into multiple myeloma -- Return to clinic in 2 months with lab work including full protein panel, B12, folate, and TSH -- Consult neurology for complaints of tremors and memory difficulties Voice recognition software was used and creation of this note. Despite my best effort at editing the text, some misspelling/errors may have occurred.  Orders Placed This Encounter  Procedures  . CBC & Diff and Retic    Standing Status:   Future    Standing Expiration Date:   11/04/2018  . Comprehensive metabolic panel    Standing Status:   Future    Standing Expiration Date:   11/04/2018  . Lactate dehydrogenase (LDH)    Standing Status:   Future    Standing Expiration Date:   11/04/2018  . Folate, Serum    Standing Status:   Future    Standing Expiration Date:   11/04/2018  . Vitamin B12    Standing  Status:   Future    Standing Expiration Date:   11/04/2018  . TSH    Standing Status:   Future    Standing Expiration Date:   11/04/2018  . Kappa/lambda light chains    Standing Status:   Future    Standing Expiration Date:   11/04/2018  . Multiple Myeloma Panel (SPEP&IFE w/QIG)    Standing Status:   Future    Standing Expiration Date:   11/04/2018    Cancer Staging No matching staging information was found for the patient.  All questions were answered. . The patient knows to call the clinic with any problems, questions or concerns.  This note was electronically signed.    History of Presenting Illness Keith Bernard is a 72 y.o. male following in the [place] for monoclonal gammopathy of unknown significance (MGUS), referred by Dr Rexene Agent.  Please see hematological/oncological history below for details.  His past medical history significant for coronary artery disease with previous PCI, obesity, history of right total knee replacement, history of right lower extremity neuropathy with drop food development, asthma, obstructive sleep apnea with intermittent use of CPAP, chronic kidney disease, stage III.,  Patient's family history is significant for paternal cousin with history of amyloidosis.  At the present time, patient complains of persistent fatigue.  Patient did have a bout of night sweats 2-3 months ago, but those have resolved.  His activity level has been significantly decreased and he has gained some weight which he attributes to inactivity.  Patient denies chest pain, shortness of breath, or cough.  No nausea, vomiting, abdominal pain, diarrhea, or constipation.  No dysuria or hematuria.  Additionally, patient is complaining of tremors and memory problems.   Oncological/hematological History: --Labs, 02/22/17: SPEP -- M-Spike 0.2g/dL, SIFE -- positive for IgG lambda monoclonal protein; IgG 964, IgA 251, IgM 35; kappa 23.7, lambda 20.6, KLR 1.15;  --Labs, 06/22/17:  SPEP -- M-Spike 0.2g/dL, SIFE -- positive for IgG lambda monoclonal protein; IgG 946, IgA 222, IgM 44; kappa 17.0, lambda 17.5, KLR 0.97; WBC 7.1, Hgb 14.9, Plt 226; Cr 0.94, Ca 9.8 --Labs, 09/16/17: WBC 7.4, Hgb 13.7, Plt 191 --Labs, 10/28/17: tProt 7.2, Alb 3.8, Ca 9.3, Cr 1.0, AP 60, LDH 208, beta-2 microglobulin 2.5; WBC 8.0, Hgb 14.1, MCV 102.6, MCH 33.0, RDW 13.5, Plt 201 --Skeletal Survey, 10/28/17: No evidence of skeletal lytic lesions.   No history exists.    Medical History: Past Medical History:  Diagnosis Date  . Asthma   . Chronic kidney disease (CKD), stage III (moderate) (HCC)   . Chronic pain   . 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 Depression 05/04/2016  . DJD (degenerative joint disease)   . Dyslipidemia   . ETOH abuse   . Foot ulcer ([place]) 07/07/2017  . GERD (gastroesophageal reflux disease)   . Hypertension   . Neuropathy   . Obesity   . OSA (obstructive sleep apnea)    Moderate OSA w AHI 17.7/hr now on VPAP at EEP at 7cm H2O, min PS 3cm H2O,max PS 15cm H2O  . RBBB     Surgical History: Past Surgical History:  Procedure Laterality Date  . CORONARY STENT PLACEMENT    . REPLACEMENT TOTAL KNEE     left knee    Family History: Family History  Problem Relation Age of Onset  . COPD Mother   . Pancreatic cancer Father        pancreatic   . Other Cousin        Amyloidosis    Social History: Social History   Socioeconomic History  . Marital status: Married    Spouse name: Not on file  . Number of children: Not on file  . Years of education: Not on file  . Highest education level: Not on file  Social Needs  . Financial resource strain: Not on file  . Food insecurity - worry: Not on file  . Food insecurity - inability: Not on file  . Transportation needs - medical: Not on file  .  Transportation needs - non-medical: Not on file  Occupational History  . Occupation: Risk manager  Tobacco Use  . Smoking status: Never Smoker  . Smokeless tobacco: Never Used  Substance and Sexual Activity  . Alcohol use: Yes    Alcohol/week: 0.0 oz    Comment: Moderate, beer  . Drug use: No  . Sexual activity: Not on file  Other Topics Concern  . Not on file  Social History Narrative  . Not on file    Allergies: No Known Allergies  Medications:  Current Outpatient Medications  Medication Sig Dispense Refill  . albuterol (PROAIR HFA) 108 (90 Base) MCG/ACT inhaler Inhale 2 puffs every 6 (six) hours as needed into the lungs for wheezing or shortness of breath. 1 Inhaler 3  . aspirin 81 MG tablet Take 81 mg by mouth daily.      Marland Kitchen  escitalopram (LEXAPRO) 20 MG tablet Take 20 mg by mouth daily.    Marland Kitchen esomeprazole (NEXIUM) 40 MG capsule Take 40 mg by mouth daily before breakfast.      . LORazepam (ATIVAN) 1 MG tablet Take 1 mg by mouth every 8 (eight) hours.    . metoprolol tartrate (LOPRESSOR) 25 MG tablet Take 1 tablet (25 mg total) by mouth 2 (two) times daily. 180 tablet 3  . Misc Natural Products (COLON CLEANSE PO) Take by mouth.    . mometasone-formoterol (DULERA) 100-5 MCG/ACT AERO INHALE 2 PUFFS INTO THE LUNGS 2 (TWO) TIMES DAILY. 13 g 0  . montelukast (SINGULAIR) 10 MG tablet TAKE 1 TABLET BY MOUTH AT BEDTIME. 90 tablet 3  . nitroGLYCERIN (NITROSTAT) 0.4 MG SL tablet Place 0.4 mg under the tongue every 5 (five) minutes as needed.      Marland Kitchen oxyCODONE (ROXICODONE) 15 MG immediate release tablet Take 15 mg by mouth every 4 (four) hours as needed.      Marland Kitchen Spacer/Aero-Holding Chambers (AEROCHAMBER MV) inhaler Use as instructed 1 each 0  . tamsulosin (FLOMAX) 0.4 MG CAPS capsule Take 0.4 mg by mouth daily.     . Testosterone (ANDROGEL PUMP) 20.25 MG/ACT (1.62%) GEL Place 2 Squirts onto the skin daily.      Marland Kitchen zolpidem (AMBIEN) 10 MG tablet Take 12.5 mg by mouth at bedtime as  needed for sleep.     Marland Kitchen atorvastatin (LIPITOR) 20 MG tablet TAKE 1 TABLET BY MOUTH  DAILY 90 tablet 1  . fenofibrate 160 MG tablet TAKE 1 TABLET BY MOUTH  DAILY WITH FOOD 90 tablet 1   No current facility-administered medications for this visit.     Review of Systems: Review of Systems  Constitutional: Positive for diaphoresis, fatigue and unexpected weight change.  All other systems reviewed and are negative.    PHYSICAL EXAMINATION Blood pressure 122/70, pulse 65, temperature 97.8 F (36.6 C), temperature source Oral, resp. rate 17, height 5' 9"  (1.753 m), weight 275 lb 9.6 oz (125 kg), SpO2 94 %.  ECOG PERFORMANCE STATUS: 2 - Symptomatic, <50% confined to bed  Physical Exam  Constitutional: He is oriented to person, place, and time and well-developed, well-nourished, and in no distress. No distress.  HENT:  Head: Normocephalic and atraumatic.  Mouth/Throat: Oropharynx is clear and moist. No oropharyngeal exudate.  Eyes: Conjunctivae and EOM are normal. Pupils are equal, round, and reactive to light. No scleral icterus.  Neck: No thyromegaly present.  Cardiovascular: Normal rate, regular rhythm and normal heart sounds.  No murmur heard. Pulmonary/Chest: Effort normal and breath sounds normal. No respiratory distress. He has no wheezes. He has no rales.  Abdominal: Soft. Bowel sounds are normal. He exhibits no distension. There is no tenderness. There is no rebound and no guarding.  Musculoskeletal: He exhibits no edema.  Lymphadenopathy:    He has no cervical adenopathy.  Neurological: He is alert and oriented to person, place, and time. He has normal reflexes. No cranial nerve deficit.  Skin: Skin is warm and dry. No rash noted. He is not diaphoretic. No erythema. No pallor.     LABORATORY DATA: I have personally reviewed the data as listed: No visits with results within 1 Week(s) from this visit.  Latest known visit with results is:  Appointment on 10/28/2017  Component  Date Value Ref Range Status  . Beta-2 10/28/2017 2.5* 0.6 - 2.4 mg/L Final   Siemens Immulite 2000 Immunochemiluminometric assay (ICMA)  . LDH 10/28/2017 208  125 -  245 U/L Final  . Sodium 10/28/2017 139  136 - 145 mEq/L Final  . Potassium 10/28/2017 4.3  3.5 - 5.1 mEq/L Final  . Chloride 10/28/2017 103  98 - 109 mEq/L Final  . CO2 10/28/2017 26  22 - 29 mEq/L Final  . Glucose 10/28/2017 99  70 - 140 mg/dl Final   Glucose reference range is for nonfasting patients. Fasting glucose reference range is 70- 100.  Marland Kitchen BUN 10/28/2017 16.3  7.0 - 26.0 mg/dL Final  . Creatinine 10/28/2017 1.0  0.7 - 1.3 mg/dL Final  . Total Bilirubin 10/28/2017 0.91  0.20 - 1.20 mg/dL Final  . Alkaline Phosphatase 10/28/2017 60  40 - 150 U/L Final  . AST 10/28/2017 23  5 - 34 U/L Final  . ALT 10/28/2017 15  0 - 55 U/L Final  . Total Protein 10/28/2017 7.2  6.4 - 8.3 g/dL Final  . Albumin 10/28/2017 3.8  3.5 - 5.0 g/dL Final  . Calcium 10/28/2017 9.3  8.4 - 10.4 mg/dL Final  . Anion Gap 10/28/2017 10  3 - 11 mEq/L Final  . EGFR 10/28/2017 >60  >60 ml/min/1.73 m2 Final   eGFR is calculated using the CKD-EPI Creatinine Equation (2009)  . WBC 10/28/2017 8.0  4.0 - 10.3 10e3/uL Final  . NEUT# 10/28/2017 5.6  1.5 - 6.5 10e3/uL Final  . HGB 10/28/2017 14.1  13.0 - 17.1 g/dL Final  . HCT 10/28/2017 43.8  38.4 - 49.9 % Final  . Platelets 10/28/2017 201  140 - 400 10e3/uL Final  . MCV 10/28/2017 102.6* 79.3 - 98.0 fL Final  . MCH 10/28/2017 33.0  27.2 - 33.4 pg Final  . MCHC 10/28/2017 32.2  32.0 - 36.0 g/dL Final  . RBC 10/28/2017 4.27  4.20 - 5.82 10e6/uL Final  . RDW 10/28/2017 13.5  11.0 - 14.6 % Final  . lymph# 10/28/2017 1.4  0.9 - 3.3 10e3/uL Final  . MONO# 10/28/2017 1.0* 0.1 - 0.9 10e3/uL Final  . Eosinophils Absolute 10/28/2017 0.1  0.0 - 0.5 10e3/uL Final  . Basophils Absolute 10/28/2017 0.0  0.0 - 0.1 10e3/uL Final  . NEUT% 10/28/2017 69.5  39.0 - 75.0 % Final  . LYMPH% 10/28/2017 16.8  14.0 - 49.0 %  Final  . MONO% 10/28/2017 12.2  0.0 - 14.0 % Final  . EOS% 10/28/2017 1.1  0.0 - 7.0 % Final  . BASO% 10/28/2017 0.4  0.0 - 2.0 % Final       Ardath Sax, MD

## 2017-12-09 ENCOUNTER — Ambulatory Visit (INDEPENDENT_AMBULATORY_CARE_PROVIDER_SITE_OTHER)
Admission: RE | Admit: 2017-12-09 | Discharge: 2017-12-09 | Disposition: A | Discharge status: Home / Self Care | Payer: Medicare Other | Source: Ambulatory Visit | Attending: Internal Medicine | Admitting: Internal Medicine

## 2017-12-09 ENCOUNTER — Encounter: Payer: Self-pay | Admitting: Internal Medicine

## 2017-12-09 ENCOUNTER — Ambulatory Visit (INDEPENDENT_AMBULATORY_CARE_PROVIDER_SITE_OTHER): Payer: Medicare Other | Admitting: Internal Medicine

## 2017-12-09 VITALS — BP 110/68 | HR 73 | Ht 68.5 in | Wt 267.8 lb

## 2017-12-09 DIAGNOSIS — J4540 Moderate persistent asthma, uncomplicated: Secondary | ICD-10-CM | POA: Diagnosis not present

## 2017-12-09 LAB — NITRIC OXIDE: Nitric Oxide: 13

## 2017-12-09 NOTE — Progress Notes (Signed)
Subjective:     Patient ID: Keith Bernard, male   DOB: Mar 31, 1946, 72 y.o.   MRN: [id number]  HPI  OV 01/29/2016  Chief Complaint  Patient presents with  . Follow-up    Pt here after acute visit with SG on 2.10.17. Pt states his breathing has imrpoved since last OV. Pt states he has occassional wheezing and mild non prod cough. Pt denies CP/tightness.     [name] returns for chronic obstructive lung disease asthma follow-up. He is now on Dulera plus Singulair. He is compliant with Dulera. He says with improved compliance his symptoms are a lot better. His wife is here with him. Most recently he had a flareup and was treated with prednisone. I review the chart when he was seen by [name] gross my nurse practitioner. Currently he feels asthma is well controlled. However he still uses albuterol multiple times a day for rescue but denies any nocturnal symptoms or daytime symptoms. He is compliant with this CPAP for sleep apnea.  Review of the chart shows this is in December 2012 has elevated eos 400 cells per cubic millimeter. I notice that and IgE was never checked. In any event subjectively is well controlled currently. Also noted on alpha-1 has not been checked.  Asthma control questionnaire shows that at night he hardly ever wakes up because of asthma. When he wakes up he has very mild symptoms. In the daytime he is moderately limited because of asthma although this could be because of obesity. He also has moderate shortness of breath because of asthma although this could be because of obesity. He is wheezing a lot of the time and uses albuterol 3-4 puffs on most days. Nevertheless average score of 2.2 show significant improvement after starting [place] 05/04/2016  Chief Complaint  Patient presents with  . Follow-up    Pt c/o occasional wheeze since not being on Breo for about a week. Pt has been using albuterol HFA more. Pt denies cough/SOB/CP/tightness.    Follow-up asthma. Last  seen March 2017. This is a routine follow-up. I put him on Breo when she really liked. However the cost dollars out of pocket because he is in the donut hole. So he ran out of it over a week ago and is having increased symptoms. Asthma control questionnaire is 2.0 and still within his range from prior visits. Exhaled nitric oxide is just over 25 suggests some amount of active airway inflammation. His wife is here with him and she says that she got approved for the [facility] patient assistance program but apparently between our office in the mailroom the paperwork has been loss. She is willing to redo the paperwork and she is wondering if I could sign my part of it this week.  In terms of symptoms specifically tells me that he does wake up a few times at night. When he wakes up he has very mild symptoms. Slightly limited in his activities because of asthma. He has moderate amount of shortness of breath with exertion. He does wheeze a little of the time and is using albuterol for rescue at least one or 2 times daily. 5 point score is 2/.0 showing activity - is worse this past week or so is within normal   OV 01/04/2017   Chief Complaint  Patient presents with  . Follow-up    Pt states last week he had an 'asthma attack' and went to ED in [place] and was given steroid and abx per  pt's wife. Pt states he feels he is improving. Pt states he does not feel back to baseline. Pt denies CP/tightness and f/c/s.     Follow-up moderate persistent asthma  Last seen June 2017. He presents with his wife as always. He is on Dulera through the assistance program. Some 3 weeks ago his sister died from terminal cancer. Take a flight to [place]. His been dealing with those emotions. Because of the travel, emotions and weather change some 10 days ago he had an asthma exacerbation but never below few days with increased cough and wheezing. He went to a local emergency department and got one shot of steroids and was given 7  days of cephalexin which is completed. Despite this is not fully better. He still has some cough and wheeze and yellow sputum. There is no fever.   OV 07/07/2017  Chief Complaint  Patient presents with  . Follow-up    Pt using Dulera, still having trouble getting this covered by insurance and getting Financial Assistance/Patient Assistance. Pt states that his breathing is doing better after senc round of Abx/Prednisone.      Follow-up moderate persistent asthma   Last seen February 2018. He presents with his wife. Overall he is doing stable except with the summer heat his symptoms are slightly more than usual as seen in the asthma control questionnaire. He says that he does not wake up at night because of asthma. When he wakes up he has mild symptoms. He has limitation in his activities because of asthma but this could also be because of obesity and DJD and spine issues. He does get dyspneic for moderate amount of the time and is wheezing a lot of the time but using albuterol for rescue 1-2 puffs per day. He's having significant difficulty getting [employer] to pay for his assistance program and is in need of samples today. He continues on Singulair.   OV 12/09/2017  Chief Complaint  Patient presents with  . Follow-up    shortness of breath   Follow-up moderate persistent asthma  Routine 44-month follow-up.  He presents with his wife.  Overall he is doing fine.  He has significant symptoms on his asthma control questionnaire.  In fact his symptoms are slowly deteriorated.  Average score is 2.8.  But this resolved mostly shortness of breath.  He feels is due to obesity.  His nitric oxide is 12 showing good asthma control on Dulera and Singulair.  He plans to lose weight.  Recently had a fall and hurt the shoulder.  Review of the images show is not had a chest x-ray since 2010.  He is up-to-date with his flu shot.  In terms of his asthma control questionnaire he hardly ever wakes up at night.  When he  wakes up he has moderate symptoms.  He is limited with his activities moderately and does express a moderate amount of shortness of breath but he does wheeze occasionally.  He is using albuterol for rescue multiple times a day.   Asthma Control Panel -December 2012eos 400 cells per cubic millimeter \ - IgE not checked as of 12/09/2017  - obstn with low dlco nov 2016 on PFT    - Alpha 1 is MM 08/23/2015  01/29/2016  05/04/2016  07/07/2017  12/09/2017   Current Med Regimen symbicort  Dulera  Off breo for several days.  On dulera 100. Struggling to afford and singulair dulera and singulair  ACQ 5 point- 1 week. wtd avg score. <1.0 is  good control 0.75-1.25 is grey zone. >1.25 poor control. Delta 0.5 is clinically meaningful 3.2 2.2 2.0 2.6 2.8  FeNO ppB 34 x 26  13  FeV1  x  x    Planned intervention  for visit Start dulera + singulair Cont dulera         has a past medical history of Asthma, Chronic kidney disease (CKD), stage III (moderate) ([place]), Chronic pain, Coronary artery disease, Depression (05/04/2016), DJD (degenerative joint disease), Dyslipidemia, ETOH abuse, Foot ulcer ([place]) (07/07/2017), GERD (gastroesophageal reflux disease), Hypertension, Neuropathy, Obesity, OSA (obstructive sleep apnea), and RBBB.   reports that  has never smoked. he has never used smokeless tobacco.  Past Surgical History:  Procedure Laterality Date  . CORONARY STENT PLACEMENT    . REPLACEMENT TOTAL KNEE     left knee    No Known Allergies  Immunization History  Administered Date(s) Administered  . DTaP 07/22/2001  . Influenza Split 09/30/2014  . Influenza, High Dose Seasonal PF 11/04/2016  . Influenza,inj,Quad PF,6+ Mos 10/23/2015  . Influenza-Unspecified 08/30/2017  . Pneumococcal Conjugate-13 01/02/2015, 10/01/2015  . Pneumococcal Polysaccharide-23 04/11/2012  . Tdap 12/22/2016  . Zoster 01/02/2015    Family History  Problem Relation Age of Onset  . COPD Mother   . Pancreatic cancer Father         pancreatic   . Other Cousin        Amyloidosis     Current Outpatient Medications:  .  albuterol (PROAIR HFA) 108 (90 Base) MCG/ACT inhaler, Inhale 2 puffs every 6 (six) hours as needed into the lungs for wheezing or shortness of breath., Disp: 1 Inhaler, Rfl: 3 .  aspirin 81 MG tablet, Take 81 mg by mouth daily.  , Disp: , Rfl:  .  atorvastatin (LIPITOR) 20 MG tablet, TAKE 1 TABLET BY MOUTH  DAILY, Disp: 90 tablet, Rfl: 1 .  escitalopram (LEXAPRO) 20 MG tablet, Take 20 mg by mouth daily., Disp: , Rfl:  .  esomeprazole (NEXIUM) 40 MG capsule, Take 40 mg by mouth daily before breakfast.  , Disp: , Rfl:  .  fenofibrate 160 MG tablet, TAKE 1 TABLET BY MOUTH  DAILY WITH FOOD, Disp: 90 tablet, Rfl: 1 .  LORazepam (ATIVAN) 1 MG tablet, Take 1 mg by mouth every 8 (eight) hours., Disp: , Rfl:  .  metoprolol tartrate (LOPRESSOR) 25 MG tablet, Take 1 tablet (25 mg total) by mouth 2 (two) times daily., Disp: 180 tablet, Rfl: 3 .  Misc Natural Products (COLON CLEANSE PO), Take by mouth., Disp: , Rfl:  .  mometasone-formoterol (DULERA) 100-5 MCG/ACT AERO, INHALE 2 PUFFS INTO THE LUNGS 2 (TWO) TIMES DAILY., Disp: 13 g, Rfl: 0 .  montelukast (SINGULAIR) 10 MG tablet, TAKE 1 TABLET BY MOUTH AT BEDTIME., Disp: 90 tablet, Rfl: 3 .  nitroGLYCERIN (NITROSTAT) 0.4 MG SL tablet, Place 0.4 mg under the tongue every 5 (five) minutes as needed.  , Disp: , Rfl:  .  oxyCODONE (ROXICODONE) 15 MG immediate release tablet, Take 15 mg by mouth every 4 (four) hours as needed.  , Disp: , Rfl:  .  tamsulosin (FLOMAX) 0.4 MG CAPS capsule, Take 0.4 mg by mouth daily. , Disp: , Rfl:  .  Testosterone (ANDROGEL PUMP) 20.25 MG/ACT (1.62%) GEL, Place 2 Squirts onto the skin daily.  , Disp: , Rfl:  .  zolpidem (AMBIEN) 10 MG tablet, Take 12.5 mg by mouth at bedtime as needed for sleep. , Disp: , Rfl:  .  Spacer/Aero-Holding Chambers (  AEROCHAMBER MV) inhaler, Use as instructed (Patient not taking: Reported on 12/09/2017), Disp: 1  each, Rfl: 0   Review of Systems     Objective:   Physical Exam  Constitutional: He is oriented to person, place, and time. He appears well-developed and well-nourished. No distress.  obese  HENT:  Head: Normocephalic and atraumatic.  Right Ear: External ear normal.  Left Ear: External ear normal.  Mouth/Throat: Oropharynx is clear and moist. No oropharyngeal exudate.  Eyes: Conjunctivae and EOM are normal. Pupils are equal, round, and reactive to light. Right eye exhibits no discharge. Left eye exhibits no discharge. No scleral icterus.  Neck: Normal range of motion. Neck supple. No JVD present. No tracheal deviation present. No thyromegaly present.  Cardiovascular: Normal rate, regular rhythm and intact distal pulses. Exam reveals no gallop and no friction rub.  No murmur heard. Pulmonary/Chest: Effort normal and breath sounds normal. No respiratory distress. He has no wheezes. He has no rales. He exhibits no tenderness.  Abdominal: Soft. Bowel sounds are normal. He exhibits no distension and no mass. There is no tenderness. There is no rebound and no guarding.  Musculoskeletal: Normal range of motion. He exhibits no edema or tenderness.  Uses cane  Lymphadenopathy:    He has no cervical adenopathy.  Neurological: He is alert and oriented to person, place, and time. He has normal reflexes. No cranial nerve deficit. Coordination normal.  Skin: Skin is warm and dry. No rash noted. He is not diaphoretic. No erythema. No pallor.  Psychiatric: He has a normal mood and affect. His behavior is normal. Judgment and thought content normal.  Nursing note and vitals reviewed.   Vitals:   12/09/17 0919  BP: 110/68  Pulse: 73  SpO2: 95%  Weight: 267 lb 12.8 oz (121.5 kg)  Height: 5' 8.5" (1.74 m)    Estimated body mass index is 40.13 kg/m as calculated from the following:   Height as of this encounter: 5' 8.5" (1.74 m).   Weight as of this encounter: 267 lb 12.8 oz (121.5 kg).       Assessment:       ICD-10-CM   1. Moderate persistent asthma without complication X38.18 Nitric oxide       Plan:      Asthma under control clinically Symptoms esp shortness of breath is out of proprotion and can likely reflect weight or other issues  Plan Get cxr 2 view Continue dulera and singulair scheduled Use albuterol as needed Lose weight via nutrition eg: weight watchers  Followup 6 months or sooner if needed   Dr. Brand Males, M.D., [facility].C.P Pulmonary and Critical Care Medicine Staff Physician, [place] Director - Interstitial Lung Disease  Program  P[facility] [place] [facility] [place], [place], [address]  Pager: [phone], If no answer or between  15:00h - 7:00h: call [phone] Telephone: [phone]

## 2017-12-09 NOTE — Patient Instructions (Signed)
ICD-10-CM   1. Moderate persistent asthma without complication G10.25 Nitric oxide    Asthma under control clinically Symptoms esp shortness of breath is out of proprotion and can likely reflect weight or other issues  Plan Get cxr 2 view Continue dulera and singulair scheduled Use albuterol as needed Lose weight via nutrition eg: weight watchers  Followup 6 months or sooner if needed

## 2017-12-20 ENCOUNTER — Telehealth: Payer: Self-pay | Admitting: Internal Medicine

## 2017-12-20 MED ORDER — PREDNISONE 10 MG PO TABS: ORAL_TABLET | ORAL | 0 refills | Status: DC

## 2017-12-20 NOTE — Telephone Encounter (Signed)
Yes he is to take cephalexin 500mg  tid x 5 days  Also should take Please take prednisone 40 mg x1 day, then 30 mg x1 day, then 20 mg x1 day, then 10 mg x1 day, and then 5 mg x1 day and stop

## 2017-12-20 NOTE — Telephone Encounter (Signed)
pt is getting a cold or a chest cold. pt has Cethapexin 500mg . he was wondering if it is good for a chest cold  Spoke with pt's wife Jocelyn Lamer Pt started getting cold in chest, productive cough-white, SOB at rest and with exertion, wheezing, and states not breathing well in last week. Pt advised he used Durlera PRN; rescue inhaler Once weekly.  Denies chest tightness or pain, fever, chills. Pt used rescue inhaler today, and will try using the nebulizer machine today to feel better with SOB.  Pt would like to know if he could use Cephalexin 500mg  capsules that he has at home for these above symptoms.   MR please advise

## 2017-12-20 NOTE — Telephone Encounter (Signed)
Pt is aware of below message and voiced his understanding. Rx for prednisone has been sent to preferred pharmacy. Nothing further is needed.  

## 2017-12-30 ENCOUNTER — Telehealth: Payer: Self-pay | Admitting: Internal Medicine

## 2017-12-30 MED ORDER — PREDNISONE 20 MG PO TABS: 20.0000 mg | ORAL_TABLET | Freq: Every day | ORAL | 0 refills | Status: DC

## 2017-12-30 NOTE — Telephone Encounter (Signed)
Discussed this pt with CY as he was DOD not sure if MR was busy at the hospital since it had been 1 hr since he was paged regarding pt.  CY okayed [place] sending an Rx of pred 20mg  #5 for pt to take 1 daily.  Called pt letting him know this information. Pt expressed understanding. Rx sent to pt's preferred pharmacy. Nothing further needed at this current time.

## 2017-12-30 NOTE — Telephone Encounter (Signed)
Patient states he just noticed that he has only taken half of recommended dosage of antibiotic. Requesting another round of prednisone and cephalexin.

## 2017-12-30 NOTE — Telephone Encounter (Signed)
Called pt and spoke with pt who stated she received 11 tabs of prednisone and also received 1 Rx of cephalexin from [facility] and 1 Rx from a mail order from two different doctors. Pt states he realized he was only taking 1 pill of cephalexin bid instead of the prescribed instructions of 2 pills bid.  Pt stated he has been using his nebulizer and his inhalers which has helped but as the day go on, he is still coughing with white mucus and no longer dark mucus like he originally was.  Pt was wondering if he could possibly be prescribed another Rx of prednisone due since it was helping with his cough and is afraid if goes the weekend without having any his cough and symptoms will worse.  MR, please advise on this.  Thanks!

## 2018-01-03 ENCOUNTER — Inpatient Hospital Stay: Payer: Medicare Other | Attending: Hematology and Oncology

## 2018-01-03 DIAGNOSIS — D7589 Other specified diseases of blood and blood-forming organs: Secondary | ICD-10-CM | POA: Diagnosis not present

## 2018-01-03 DIAGNOSIS — Z79899 Other long term (current) drug therapy: Secondary | ICD-10-CM | POA: Insufficient documentation

## 2018-01-03 DIAGNOSIS — N183 Chronic kidney disease, stage 3 (moderate): Secondary | ICD-10-CM | POA: Insufficient documentation

## 2018-01-03 DIAGNOSIS — E785 Hyperlipidemia, unspecified: Secondary | ICD-10-CM | POA: Diagnosis not present

## 2018-01-03 DIAGNOSIS — R5382 Chronic fatigue, unspecified: Secondary | ICD-10-CM | POA: Insufficient documentation

## 2018-01-03 DIAGNOSIS — R5383 Other fatigue: Secondary | ICD-10-CM

## 2018-01-03 DIAGNOSIS — I255 Ischemic cardiomyopathy: Secondary | ICD-10-CM | POA: Diagnosis not present

## 2018-01-03 DIAGNOSIS — Z7982 Long term (current) use of aspirin: Secondary | ICD-10-CM | POA: Insufficient documentation

## 2018-01-03 DIAGNOSIS — I252 Old myocardial infarction: Secondary | ICD-10-CM | POA: Diagnosis not present

## 2018-01-03 DIAGNOSIS — R251 Tremor, unspecified: Secondary | ICD-10-CM | POA: Diagnosis not present

## 2018-01-03 DIAGNOSIS — D472 Monoclonal gammopathy: Secondary | ICD-10-CM | POA: Diagnosis present

## 2018-01-03 DIAGNOSIS — Z96651 Presence of right artificial knee joint: Secondary | ICD-10-CM | POA: Diagnosis not present

## 2018-01-03 DIAGNOSIS — G4733 Obstructive sleep apnea (adult) (pediatric): Secondary | ICD-10-CM | POA: Insufficient documentation

## 2018-01-03 DIAGNOSIS — G629 Polyneuropathy, unspecified: Secondary | ICD-10-CM | POA: Diagnosis not present

## 2018-01-03 DIAGNOSIS — Z7952 Long term (current) use of systemic steroids: Secondary | ICD-10-CM | POA: Insufficient documentation

## 2018-01-03 DIAGNOSIS — J45909 Unspecified asthma, uncomplicated: Secondary | ICD-10-CM | POA: Diagnosis not present

## 2018-01-03 DIAGNOSIS — K219 Gastro-esophageal reflux disease without esophagitis: Secondary | ICD-10-CM | POA: Diagnosis not present

## 2018-01-03 DIAGNOSIS — I4510 Unspecified right bundle-branch block: Secondary | ICD-10-CM | POA: Diagnosis not present

## 2018-01-03 DIAGNOSIS — I129 Hypertensive chronic kidney disease with stage 1 through stage 4 chronic kidney disease, or unspecified chronic kidney disease: Secondary | ICD-10-CM | POA: Diagnosis not present

## 2018-01-03 DIAGNOSIS — E669 Obesity, unspecified: Secondary | ICD-10-CM | POA: Insufficient documentation

## 2018-01-03 DIAGNOSIS — I2510 Atherosclerotic heart disease of native coronary artery without angina pectoris: Secondary | ICD-10-CM | POA: Insufficient documentation

## 2018-01-03 LAB — COMPREHENSIVE METABOLIC PANEL
ALK PHOS: 61 U/L (ref 40–150)
ALT: 14 U/L (ref 0–55)
ANION GAP: 7 (ref 3–11)
AST: 18 U/L (ref 5–34)
Albumin: 3.8 g/dL (ref 3.5–5.0)
BILIRUBIN TOTAL: 0.8 mg/dL (ref 0.2–1.2)
BUN: 18 mg/dL (ref 7–26)
CALCIUM: 9.1 mg/dL (ref 8.4–10.4)
CO2: 30 mmol/L — ABNORMAL HIGH (ref 22–29)
CREATININE: 1.08 mg/dL (ref 0.70–1.30)
Chloride: 102 mmol/L (ref 98–109)
GFR calc non Af Amer: >60 mL/min (ref >60)
GLUCOSE: 100 mg/dL (ref 70–140)
Potassium: 4.2 mmol/L (ref 3.5–5.1)
Sodium: 139 mmol/L (ref 136–145)
TOTAL PROTEIN: 6.9 g/dL (ref 6.4–8.3)

## 2018-01-03 LAB — CBC WITH DIFFERENTIAL (CANCER CENTER ONLY)
Basophils Absolute: 0 10*3/uL (ref 0.0–0.1)
Basophils Relative: 1 %
EOS ABS: 0.1 10*3/uL (ref 0.0–0.5)
EOS PCT: 1 %
HCT: 44.8 % (ref 38.4–49.9)
Hemoglobin: 14.1 g/dL (ref 13.0–17.1)
Lymphocytes Relative: 29 %
Lymphs Abs: 2.1 10*3/uL (ref 0.9–3.3)
MCH: 31.5 pg (ref 27.2–33.4)
MCHC: 31.5 g/dL — AB (ref 32.0–36.0)
MCV: 100.2 fL — AB (ref 79.3–98.0)
MONO ABS: 1 10*3/uL — AB (ref 0.1–0.9)
Monocytes Relative: 14 %
Neutro Abs: 4 10*3/uL (ref 1.5–6.5)
Neutrophils Relative %: 55 %
PLATELETS: 209 10*3/uL (ref 140–400)
RBC: 4.47 MIL/uL (ref 4.20–5.82)
RDW: 13.5 % (ref 11.0–14.6)
WBC: 7.2 10*3/uL (ref 4.0–10.3)

## 2018-01-03 LAB — RETICULOCYTES
RBC.: 4.47 MIL/uL (ref 4.20–5.82)
RETIC CT PCT: 1.7 % (ref 0.8–1.8)
Retic Count, Absolute: 76 10*3/uL (ref 34.8–93.9)

## 2018-01-03 LAB — VITAMIN B12: Vitamin B-12: 1307 pg/mL — ABNORMAL HIGH (ref 180–914)

## 2018-01-03 LAB — FOLATE: Folate: 45.9 ng/mL (ref >5.9)

## 2018-01-03 LAB — LACTATE DEHYDROGENASE: LDH: 172 U/L (ref 125–245)

## 2018-01-03 LAB — TSH: TSH: 2.883 u[IU]/mL (ref 0.320–4.118)

## 2018-01-04 LAB — KAPPA/LAMBDA LIGHT CHAINS
Kappa free light chain: 18.4 mg/L (ref 3.3–19.4)
Kappa, lambda light chain ratio: 1.47 (ref 0.26–1.65)
LAMDA FREE LIGHT CHAINS: 12.5 mg/L (ref 5.7–26.3)

## 2018-01-05 ENCOUNTER — Other Ambulatory Visit: Payer: Self-pay

## 2018-01-05 ENCOUNTER — Telehealth: Payer: Self-pay | Admitting: Hematology and Oncology

## 2018-01-05 ENCOUNTER — Encounter: Payer: Self-pay | Admitting: Hematology and Oncology

## 2018-01-05 ENCOUNTER — Inpatient Hospital Stay (HOSPITAL_BASED_OUTPATIENT_CLINIC_OR_DEPARTMENT_OTHER): Payer: Medicare Other | Admitting: Hematology and Oncology

## 2018-01-05 VITALS — BP 153/71 | HR 69 | Temp 98.0°F | Resp 20 | Ht 68.5 in | Wt 271.0 lb

## 2018-01-05 DIAGNOSIS — R5383 Other fatigue: Secondary | ICD-10-CM

## 2018-01-05 DIAGNOSIS — Z96651 Presence of right artificial knee joint: Secondary | ICD-10-CM

## 2018-01-05 DIAGNOSIS — E669 Obesity, unspecified: Secondary | ICD-10-CM

## 2018-01-05 DIAGNOSIS — K219 Gastro-esophageal reflux disease without esophagitis: Secondary | ICD-10-CM

## 2018-01-05 DIAGNOSIS — D472 Monoclonal gammopathy: Secondary | ICD-10-CM | POA: Diagnosis not present

## 2018-01-05 DIAGNOSIS — R251 Tremor, unspecified: Secondary | ICD-10-CM | POA: Diagnosis not present

## 2018-01-05 DIAGNOSIS — Z7982 Long term (current) use of aspirin: Secondary | ICD-10-CM

## 2018-01-05 DIAGNOSIS — I255 Ischemic cardiomyopathy: Secondary | ICD-10-CM

## 2018-01-05 DIAGNOSIS — I2510 Atherosclerotic heart disease of native coronary artery without angina pectoris: Secondary | ICD-10-CM | POA: Diagnosis not present

## 2018-01-05 DIAGNOSIS — N183 Chronic kidney disease, stage 3 (moderate): Secondary | ICD-10-CM | POA: Diagnosis not present

## 2018-01-05 DIAGNOSIS — R5382 Chronic fatigue, unspecified: Secondary | ICD-10-CM

## 2018-01-05 DIAGNOSIS — D7589 Other specified diseases of blood and blood-forming organs: Secondary | ICD-10-CM | POA: Diagnosis not present

## 2018-01-05 DIAGNOSIS — I252 Old myocardial infarction: Secondary | ICD-10-CM

## 2018-01-05 DIAGNOSIS — Z7952 Long term (current) use of systemic steroids: Secondary | ICD-10-CM

## 2018-01-05 DIAGNOSIS — I129 Hypertensive chronic kidney disease with stage 1 through stage 4 chronic kidney disease, or unspecified chronic kidney disease: Secondary | ICD-10-CM | POA: Diagnosis not present

## 2018-01-05 DIAGNOSIS — Z79899 Other long term (current) drug therapy: Secondary | ICD-10-CM

## 2018-01-05 DIAGNOSIS — I4510 Unspecified right bundle-branch block: Secondary | ICD-10-CM

## 2018-01-05 DIAGNOSIS — G629 Polyneuropathy, unspecified: Secondary | ICD-10-CM

## 2018-01-05 DIAGNOSIS — J45909 Unspecified asthma, uncomplicated: Secondary | ICD-10-CM

## 2018-01-05 DIAGNOSIS — E785 Hyperlipidemia, unspecified: Secondary | ICD-10-CM | POA: Diagnosis not present

## 2018-01-05 DIAGNOSIS — G4733 Obstructive sleep apnea (adult) (pediatric): Secondary | ICD-10-CM

## 2018-01-05 LAB — MULTIPLE MYELOMA PANEL, SERUM
ALBUMIN SERPL ELPH-MCNC: 3.6 g/dL (ref 2.9–4.4)
Albumin/Glob SerPl: 1.3 (ref 0.7–1.7)
Alpha 1: 0.2 g/dL (ref 0.0–0.4)
Alpha2 Glob SerPl Elph-Mcnc: 0.7 g/dL (ref 0.4–1.0)
B-Globulin SerPl Elph-Mcnc: 1 g/dL (ref 0.7–1.3)
Gamma Glob SerPl Elph-Mcnc: 0.9 g/dL (ref 0.4–1.8)
Globulin, Total: 2.9 g/dL (ref 2.2–3.9)
IGA: 240 mg/dL (ref 61–437)
IGM (IMMUNOGLOBULIN M), SRM: 38 mg/dL (ref 15–143)
IgG (Immunoglobin G), Serum: 1023 mg/dL (ref 700–1600)
M Protein SerPl Elph-Mcnc: 0.3 g/dL — ABNORMAL HIGH
TOTAL PROTEIN ELP: 6.5 g/dL (ref 6.0–8.5)

## 2018-01-05 NOTE — Telephone Encounter (Signed)
Gave avs and calendar for April and may °

## 2018-01-11 ENCOUNTER — Telehealth: Payer: Self-pay | Admitting: Internal Medicine

## 2018-01-11 MED ORDER — ALBUTEROL SULFATE HFA 108 (90 BASE) MCG/ACT IN AERS: 2.0000 | INHALATION_SPRAY | Freq: Four times a day (QID) | RESPIRATORY_TRACT | 5 refills | Status: DC | PRN

## 2018-01-11 MED ORDER — MOMETASONE FURO-FORMOTEROL FUM 100-5 MCG/ACT IN AERO: INHALATION_SPRAY | RESPIRATORY_TRACT | 5 refills | Status: DC

## 2018-01-11 NOTE — Telephone Encounter (Signed)
Last ov 1.10.19 with MR: Patient Instructions      ICD-10-CM    1. Moderate persistent asthma without complication O03.55 Nitric oxide  Asthma under control clinically Symptoms esp shortness of breath is out of proprotion and can likely reflect weight or other issues   Plan Get cxr 2 view Continue dulera and singulair scheduled Use albuterol as needed Lose weight via nutrition eg: weight watchers   Followup 6 months or sooner if needed    Called spoke with patient's spouse Vickie and verified that patient is needing refills on Dulera 100 and ProAir to [facility] in [place].  Advised Vickie will be happy to send in refills.  Vickie also asking about [employer] patient assistance papers that were brought to the office at time of visit.  Vickie reported that they never received the forms back and they still need to put their financial information on the forms.  Advised Vickie with check on this and our office will call her back.  Checked with Raquel Sarna, no sign of the [employer] patient assistance forms - MR could have signed them and sent them back to [employer].  Called spoke with Vickie, she is aware and will initiate the forms again.  Nothing further needed; will sign off.

## 2018-01-12 ENCOUNTER — Telehealth: Payer: Self-pay | Admitting: Internal Medicine

## 2018-01-12 MED ORDER — PREDNISONE 10 MG PO TABS: ORAL_TABLET | ORAL | 0 refills | Status: DC

## 2018-01-12 MED ORDER — CEPHALEXIN 500 MG PO CAPS: 500.0000 mg | ORAL_CAPSULE | Freq: Three times a day (TID) | ORAL | 0 refills | Status: DC

## 2018-01-12 NOTE — Telephone Encounter (Signed)
Spoke with pt  He is c/o inreased SOB and wheezing x 2 days  He is not coughing any more than usual, and denies any mucus production He denies any f/c/s, chest tightness, CP or other co's  He states they had to cancel his colonoscopy on 01/11/18 because his sats were 87% ra  He does not have home o2  He has scheduled appt with TP for Friday 01/14/18  He is requesting pred taper now  Please advise thanks!

## 2018-01-12 NOTE — Telephone Encounter (Signed)
Prescriptions sent to pharmacy, patient aware nothing further needed.

## 2018-01-12 NOTE — Telephone Encounter (Signed)
Please take prednisone 40 mg x1 day, then 30 mg x1 day, then 20 mg x1 day, then 10 mg x1 day, and then 5 mg x1 day and stop   cephalexin 500mg  three times daily x  5 days

## 2018-01-13 ENCOUNTER — Ambulatory Visit: Payer: Medicare Other | Admitting: Cardiology

## 2018-01-14 ENCOUNTER — Ambulatory Visit (INDEPENDENT_AMBULATORY_CARE_PROVIDER_SITE_OTHER): Payer: Medicare Other | Admitting: Adult Health

## 2018-01-14 ENCOUNTER — Encounter: Payer: Self-pay | Admitting: Adult Health

## 2018-01-14 VITALS — BP 138/80 | HR 70 | Ht 70.0 in | Wt 271.4 lb

## 2018-01-14 DIAGNOSIS — G4733 Obstructive sleep apnea (adult) (pediatric): Secondary | ICD-10-CM | POA: Diagnosis not present

## 2018-01-14 DIAGNOSIS — J4541 Moderate persistent asthma with (acute) exacerbation: Secondary | ICD-10-CM

## 2018-01-14 MED ORDER — CEPHALEXIN 500 MG PO CAPS: 500.0000 mg | ORAL_CAPSULE | Freq: Three times a day (TID) | ORAL | 0 refills | Status: DC

## 2018-01-14 MED ORDER — PREDNISONE 10 MG PO TABS: ORAL_TABLET | ORAL | 0 refills | Status: DC

## 2018-01-14 MED ORDER — ALBUTEROL SULFATE (2.5 MG/3ML) 0.083% IN NEBU: 2.5000 mg | INHALATION_SOLUTION | Freq: Four times a day (QID) | RESPIRATORY_TRACT | 5 refills | Status: AC | PRN

## 2018-01-14 MED ORDER — MOMETASONE FURO-FORMOTEROL FUM 200-5 MCG/ACT IN AERO: 2.0000 | INHALATION_SPRAY | Freq: Two times a day (BID) | RESPIRATORY_TRACT | 0 refills | Status: DC

## 2018-01-14 NOTE — Patient Instructions (Signed)
Increase Dulera 200 2 puffs Twice daily  , until sample is gone then back to [facility] 100 2 puffs Twice daily   Extend Cephalexin for 2 additional days Extend prednisone taper for 1 week  Mucinex DM Twice daily  As needed  Cough/congestion  Continue on BIPAP At bedtime  - gets new tubing today please.  May use Albuterol Nebs As needed  Wheezing  follow up with [name] in 6 weeks and As needed   Please contact office for sooner follow up if symptoms do not improve or worsen or seek emergency care

## 2018-01-14 NOTE — Assessment & Plan Note (Signed)
Acute asthmatic bronchitic exacerbation slow to resolve.  Plan  Patient Instructions  Increase Dulera 200 2 puffs Twice daily  , until sample is gone then back to [facility] 100 2 puffs Twice daily   Extend Cephalexin for 2 additional days Extend prednisone taper for 1 week  Mucinex DM Twice daily  As needed  Cough/congestion  Continue on BIPAP At bedtime  - gets new tubing today please.  May use Albuterol Nebs As needed  Wheezing  follow up with [name] in 6 weeks and As needed   Please contact office for sooner follow up if symptoms do not improve or worsen or seek emergency care

## 2018-01-14 NOTE — Progress Notes (Signed)
@Patient  ID: Keith Bernard, male    DOB: 1946/08/25, 72 y.o.   MRN: [id number]  Chief Complaint  Patient presents with  . Follow-up    Asthma     Referring provider: [name], MD  HPI: 72 yo male never smoker followed for chronic obstructive asthma   01/14/2018 Acute OV : Asthma  Pt presents for an acute office visit.  Patient complains over the last 2 weeks he has had increased cough congestion and wheezing with shortness of breath .  He was treated 2 weeks ago with prednisone for 5 days and says that symptoms only minimally improved.  He was called in cephalexin and prednisone 2 days ago does feel that he is getting some better.  He denies any fever hemoptysis orthopnea PND or increased leg swelling.  Patient also noticed recently that his BiPAP is not been working he realized that he has holes in his BiPAP tubing from his cats.  He is getting new tubing today. Patient says he was supposed to have a colonoscopy earlier this week but was unable to do it due to his cough and congestion.. Chest x-ray last month with bronchitic changes no acute process noted   No Known Allergies  Immunization History  Administered Date(s) Administered  . DTaP 07/22/2001  . Influenza Split 09/30/2014  . Influenza, High Dose Seasonal PF 11/04/2016  . Influenza,inj,Quad PF,6+ Mos 10/23/2015  . Influenza-Unspecified 08/30/2017  . Pneumococcal Conjugate-13 01/02/2015, 10/01/2015  . Pneumococcal Polysaccharide-23 04/11/2012  . Tdap 12/22/2016  . Zoster 01/02/2015    Past Medical History:  Diagnosis Date  . Asthma   . Chronic kidney disease (CKD), stage III (moderate) (HCC)   . Chronic pain   . 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 Depression 05/04/2016  . DJD (degenerative joint disease)   . Dyslipidemia   . ETOH abuse   . Foot  ulcer ([place]) 07/07/2017  . GERD (gastroesophageal reflux disease)   . Hypertension   . Neuropathy   . Obesity   . OSA (obstructive sleep apnea)    Moderate OSA w AHI 17.7/hr now on VPAP at EEP at 7cm H2O, min PS 3cm H2O,max PS 15cm H2O  . RBBB     Tobacco History: Social History   Tobacco Use  Smoking Status Never Smoker  Smokeless Tobacco Never Used   Counseling given: Not Answered   Outpatient Encounter Medications as of 01/14/2018  Medication Sig  . albuterol (PROAIR HFA) 108 (90 Base) MCG/ACT inhaler Inhale 2 puffs into the lungs every 6 (six) hours as needed for wheezing or shortness of breath.  Marland Kitchen aspirin 81 MG tablet Take 81 mg by mouth daily.    Marland Kitchen atorvastatin (LIPITOR) 20 MG tablet TAKE 1 TABLET BY MOUTH  DAILY  . cephALEXin (KEFLEX) 500 MG capsule Take 1 capsule (500 mg total) by mouth 3 (three) times daily.  Marland Kitchen escitalopram (LEXAPRO) 20 MG tablet Take 20 mg by mouth daily.  Marland Kitchen esomeprazole (NEXIUM) 40 MG capsule Take 40 mg by mouth daily before breakfast.    . fenofibrate 160 MG tablet TAKE 1 TABLET BY MOUTH  DAILY WITH FOOD  . LORazepam (ATIVAN) 1 MG tablet Take 1 mg by mouth every 8 (eight) hours.  . metoprolol tartrate (LOPRESSOR) 25 MG tablet Take 1 tablet (25  mg total) by mouth 2 (two) times daily.  . Misc Natural Products (COLON CLEANSE PO) Take by mouth.  . mometasone-formoterol (DULERA) 100-5 MCG/ACT AERO INHALE 2 PUFFS INTO THE LUNGS 2 (TWO) TIMES DAILY.  . montelukast (SINGULAIR) 10 MG tablet TAKE 1 TABLET BY MOUTH AT BEDTIME.  . nitroGLYCERIN (NITROSTAT) 0.4 MG SL tablet Place 0.4 mg under the tongue every 5 (five) minutes as needed.    Marland Kitchen oxyCODONE (ROXICODONE) 15 MG immediate release tablet Take 15 mg by mouth every 4 (four) hours as needed.    . predniSONE (DELTASONE) 10 MG tablet 40 mg x1 day, then 30 mg x1 day, then 20 mg x1 day, then 10 mg x1 day, and then 5 mg x1 day and stop  . tamsulosin (FLOMAX) 0.4 MG CAPS capsule Take 0.4 mg by mouth daily.   .  Testosterone (ANDROGEL PUMP) 20.25 MG/ACT (1.62%) GEL Place 2 Squirts onto the skin daily.    Marland Kitchen zolpidem (AMBIEN) 10 MG tablet Take 12.5 mg by mouth at bedtime as needed for sleep.   Marland Kitchen Spacer/Aero-Holding Chambers (AEROCHAMBER MV) inhaler Use as instructed (Patient not taking: Reported on 12/09/2017)  . [DISCONTINUED] predniSONE (DELTASONE) 20 MG tablet Take 1 tablet (20 mg total) by mouth daily with breakfast.   No facility-administered encounter medications on file as of 01/14/2018.      Review of Systems  Constitutional:   No  weight loss, night sweats,  Fevers, chills,  +fatigue, or  lassitude.  HEENT:   No headaches,  Difficulty swallowing,  Tooth/dental problems, or  Sore throat,                No sneezing, itching, ear ache, nasal congestion, post nasal drip,   CV:  No chest pain,  Orthopnea, PND, swelling in lower extremities, anasarca, dizziness, palpitations, syncope.   GI  No heartburn, indigestion, abdominal pain, nausea, vomiting, diarrhea, change in bowel habits, loss of appetite, bloody stools.   Resp: No chest wall deformity  Skin: no rash or lesions.  GU: no dysuria, change in color of urine, no urgency or frequency.  No flank pain, no hematuria   MS:  No joint pain or swelling.  No decreased range of motion.  No back pain.    Physical Exam  BP 138/80 (BP Location: Left Arm, Cuff Size: Normal)   Pulse 70   Ht 5\' 10"  (1.778 m)   Wt 271 lb 6.4 oz (123.1 kg)   SpO2 90%   BMI 38.94 kg/m    GEN: A/Ox3; pleasant , NAD, elderly obese   HEENT:  [place]/AT,  EACs-clear, TMs-wnl, NOSE-clear, THROAT-clear, no lesions, no postnasal drip or exudate noted.   NECK:  Supple w/ fair ROM; no JVD; normal carotid impulses w/o bruits; no thyromegaly or nodules palpated; no lymphadenopathy.  No stridor  RESP decreased breath sounds in the bases  no accessory muscle use, no dullness to percussion Speaks in full sentences  CARD:  RRR, no m/r/g, no peripheral edema, pulses intact,  no cyanosis or clubbing.  GI:   Soft & nt; nml bowel sounds; no organomegaly or masses detected.   Musco: Warm bil, no deformities or joint swelling noted.  Brace right lower leg  Neuro: alert, no focal deficits noted.    Skin: Warm, no lesions or rashes    Lab Results:  CBC    Component Value Date/Time   WBC 7.2 01/03/2018 1139   WBC 8.0 10/28/2017 1602   WBC 5.7 11/06/2011 0317   RBC 4.47  01/03/2018 1139   RBC 4.47 01/03/2018 1139   HGB 14.1 10/28/2017 1602   HCT 44.8 01/03/2018 1139   HCT 43.8 10/28/2017 1602   PLT 209 01/03/2018 1139   PLT 201 10/28/2017 1602   MCV 100.2 (H) 01/03/2018 1139   MCV 102.6 (H) 10/28/2017 1602   MCH 31.5 01/03/2018 1139   MCHC 31.5 (L) 01/03/2018 1139   RDW 13.5 01/03/2018 1139   RDW 13.5 10/28/2017 1602   LYMPHSABS 2.1 01/03/2018 1139   LYMPHSABS 1.4 10/28/2017 1602   MONOABS 1.0 (H) 01/03/2018 1139   MONOABS 1.0 (H) 10/28/2017 1602   EOSABS 0.1 01/03/2018 1139   EOSABS 0.1 10/28/2017 1602   BASOSABS 0.0 01/03/2018 1139   BASOSABS 0.0 10/28/2017 1602    BMET    Component Value Date/Time   NA 139 01/03/2018 1140   NA 139 10/28/2017 1602   K 4.2 01/03/2018 1140   K 4.3 10/28/2017 1602   CL 102 01/03/2018 1140   CO2 30 (H) 01/03/2018 1140   CO2 26 10/28/2017 1602   GLUCOSE 100 01/03/2018 1140   GLUCOSE 99 10/28/2017 1602   BUN 18 01/03/2018 1140   BUN 16.3 10/28/2017 1602   CREATININE 1.08 01/03/2018 1140   CREATININE 1.0 10/28/2017 1602   CALCIUM 9.1 01/03/2018 1140   CALCIUM 9.3 10/28/2017 1602   GFRNONAA >60 01/03/2018 1140   GFRAA >60 01/03/2018 1140    BNP No results found for: BNP  ProBNP No results found for: PROBNP  Imaging: No results found.   Assessment & Plan:   No problem-specific Assessment & Plan notes found for this encounter.     Rexene Edison, NP 01/14/2018

## 2018-01-14 NOTE — Assessment & Plan Note (Signed)
Continue on BiPAP at bedtime.  Patient is to get new tubing today.  Plan  Cont on BIPAP At bedtime  .  Wt loss

## 2018-01-30 ENCOUNTER — Encounter: Payer: Self-pay | Admitting: Hematology and Oncology

## 2018-01-30 NOTE — Progress Notes (Signed)
[facility] Follow-up Visit:  Assessment: MGUS (monoclonal gammopathy of unknown significance) 72 y.o. male with multiple chronic medical conditions including chronic kidney disease and peripheral neuropathy confined to right lower extremity who was found to have a low-level monoclonal gammopathy of unknown significance in March 2018.  Follow-up testing and straight presence of IgG lambda monoclonal protein with level stable as of July 2018.  Patient does not have anemia, kidney function appears to be reasonably stable, no hypercalcemia.  Additional evaluation reveals no evidence of progressive anemia, or thrombocytopenia.  Renal function is stable without evidence of hypercalcemia.  Skeletal survey negative for lytic skeletal lesions.  At this point, if patient has multiple myeloma with appears to be a smoldering and will not require therapy as of now.  Patient does have some macrocytosis present in his peripheral blood likely attributable to his alcohol consumption.  Plan: -Continue observation for monoclonal gammopathy of unknown significance  due to potential for transformation into multiple myeloma -Return to clinic in 3 months with lab work including full protein panel, B12, folate, and TSH  Voice recognition software was used and creation of this note. Despite my best effort at editing the text, some misspelling/errors may have occurred.  Orders Placed This Encounter  Procedures  . CBC with Differential (Cancer Center Only)    Standing Status:   Future    Standing Expiration Date:   01/05/2019  . CMP ([facility] only)    Standing Status:   Future    Standing Expiration Date:   01/05/2019  . Lactate dehydrogenase (LDH)    Standing Status:   Future    Standing Expiration Date:   01/05/2019  . Multiple Myeloma Panel (SPEP&IFE w/QIG)    Standing Status:   Future    Standing Expiration Date:   01/05/2019  . Kappa/lambda light chains    Standing Status:   Future   Standing Expiration Date:   01/05/2019  . Beta 2 microglobulin    Standing Status:   Future    Standing Expiration Date:   01/05/2019    Cancer Staging No matching staging information was found for the patient.  All questions were answered. . The patient knows to call the clinic with any problems, questions or concerns.  This note was electronically signed.    History of Presenting Illness Keith Bernard is a 72 y.o. male following in the [facility] for monoclonal gammopathy of unknown significance (MGUS), referred by Dr Rexene Agent.  Please see hematological/oncological history below for details.  His past medical history significant for coronary artery disease with previous PCI, obesity, history of right total knee replacement, history of right lower extremity neuropathy with drop food development, asthma, obstructive sleep apnea with intermittent use of CPAP, chronic kidney disease, stage III.,  Patient's family history is significant for paternal cousin with history of amyloidosis.  At the present time, patient complains of persistent fatigue.  Patient did have a bout of night sweats 2-3 months ago, but those have resolved.  His activity level has been significantly decreased and he has gained some weight which he attributes to inactivity.  Patient denies chest pain, shortness of breath, or cough.  No nausea, vomiting, abdominal pain, diarrhea, or constipation.  No dysuria or hematuria.  Additionally, patient is complaining of tremors and memory problems.  Continues to complain of lower extremity neuropathy.  Continues to drink approximately 6 ounces of alcohol per night.   Oncological/hematological History: --Labs, 02/22/17:  SPEP -- M-Spike 0.2g/dL, SIFE -- positive for IgG lambda monoclonal protein; IgG   964, IgA 251, IgM 35; kappa 23.7, lambda 20.6, KLR 1.15;  --Labs, 06/22/17:               SPEP -- M-Spike 0.2g/dL, SIFE -- positive for IgG lambda monoclonal  protein; IgG   946, IgA 222, IgM 44; kappa 17.0, lambda 17.5, KLR 0.97; WBC 7.1, Hgb 14.9, Plt 226; Cr 0.94, Ca 9.8 --Labs, 09/16/17: WBC 7.4, Hgb 13.7, Plt 191 --Labs, 10/28/17: tProt 7.2, Alb 3.8, Ca 9.3, Cr 1.0, AP 60, LDH 208, beta-2 microglobulin 2.5; WBC 8.0, Hgb 14.1, MCV 102.6, MCH 33.0, RDW 13.5, Plt 201 --Skeletal Survey, 10/28/17: No evidence of skeletal lytic lesions. --Labs, 01/03/18: tProt 6.9, Alb 3.8, Ca 9.1, Cr 1.1, AP 61; SPEP -- MSpike 0.3g/dL, SIFE -- positive for IgG lambda monoclonal protein; IgG 1023, IgA 240, IgM 38; kappa 18.4, lambda 12.5, KLR 1.47; WBC 7.2, ANC 4.0, ALC 2.1, Mono 1.0, Hgb 14.1, MCV 100.2, MCH 31.5, MCHC 31.5, RDW 13.5, Plt 209; Vit B12 1307; TSH 2.88   No history exists.    Medical History: Past Medical History:  Diagnosis Date  . Asthma   . Chronic kidney disease (CKD), stage III (moderate) (HCC)   . Chronic pain   . 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 Depression 05/04/2016  . DJD (degenerative joint disease)   . Dyslipidemia   . ETOH abuse   . Foot ulcer ([place]) 07/07/2017  . GERD (gastroesophageal reflux disease)   . Hypertension   . Neuropathy   . Obesity   . OSA (obstructive sleep apnea)    Moderate OSA w AHI 17.7/hr now on VPAP at EEP at 7cm H2O, min PS 3cm H2O,max PS 15cm H2O  . RBBB     Surgical History: Past Surgical History:  Procedure Laterality Date  . CORONARY STENT PLACEMENT    . REPLACEMENT TOTAL KNEE     left knee    Family History: Family History  Problem Relation Age of Onset  . COPD Mother   . Pancreatic cancer Father        pancreatic   . Other Cousin        Amyloidosis    Social History: Social History   Socioeconomic History  . Marital status: Married    Spouse name: Not on file  . Number of children: Not on file  . Years of education: Not  on file  . Highest education level: Not on file  Social Needs  . Financial resource strain: Not on file  . Food insecurity - worry: Not on file  . Food insecurity - inability: Not on file  . Transportation needs - medical: Not on file  . Transportation needs - non-medical: Not on file  Occupational History  . Occupation: Risk manager  Tobacco Use  . Smoking status: Never Smoker  . Smokeless tobacco: Never Used  Substance and Sexual Activity  . Alcohol use: Yes    Alcohol/week: 0.0 oz    Comment: Moderate, beer  . Drug use: No  . Sexual activity: Not on file  Other Topics Concern  . Not on file  Social History Narrative  . Not on file    Allergies: No Known Allergies  Medications:  Current Outpatient  Medications  Medication Sig Dispense Refill  . albuterol (PROAIR HFA) 108 (90 Base) MCG/ACT inhaler Inhale 2 puffs into the lungs every 6 (six) hours as needed for wheezing or shortness of breath. 1 Inhaler 5  . albuterol (PROVENTIL) (2.5 MG/3ML) 0.083% nebulizer solution Take 3 mLs (2.5 mg total) by nebulization every 6 (six) hours as needed for wheezing or shortness of breath. 75 mL 5  . aspirin 81 MG tablet Take 81 mg by mouth daily.      Marland Kitchen atorvastatin (LIPITOR) 20 MG tablet TAKE 1 TABLET BY MOUTH  DAILY 90 tablet 1  . cephALEXin (KEFLEX) 500 MG capsule Take 1 capsule (500 mg total) by mouth 3 (three) times daily. 15 capsule 0  . cephALEXin (KEFLEX) 500 MG capsule Take 1 capsule (500 mg total) by mouth 3 (three) times daily. 6 capsule 0  . escitalopram (LEXAPRO) 20 MG tablet Take 20 mg by mouth daily.    Marland Kitchen esomeprazole (NEXIUM) 40 MG capsule Take 40 mg by mouth daily before breakfast.      . fenofibrate 160 MG tablet TAKE 1 TABLET BY MOUTH  DAILY WITH FOOD 90 tablet 1  . LORazepam (ATIVAN) 1 MG tablet Take 1 mg by mouth every 8 (eight) hours.    . metoprolol tartrate (LOPRESSOR) 25 MG tablet Take 1 tablet (25 mg total) by mouth 2 (two) times daily. 180 tablet 3  .  Misc Natural Products (COLON CLEANSE PO) Take by mouth.    . mometasone-formoterol (DULERA) 100-5 MCG/ACT AERO INHALE 2 PUFFS INTO THE LUNGS 2 (TWO) TIMES DAILY. 13 g 5  . mometasone-formoterol (DULERA) 200-5 MCG/ACT AERO Inhale 2 puffs into the lungs 2 (two) times daily. 1 Inhaler 0  . montelukast (SINGULAIR) 10 MG tablet TAKE 1 TABLET BY MOUTH AT BEDTIME. 90 tablet 3  . nitroGLYCERIN (NITROSTAT) 0.4 MG SL tablet Place 0.4 mg under the tongue every 5 (five) minutes as needed.      Marland Kitchen oxyCODONE (ROXICODONE) 15 MG immediate release tablet Take 15 mg by mouth every 4 (four) hours as needed.      . predniSONE (DELTASONE) 10 MG tablet 40 mg x1 day, then 30 mg x1 day, then 20 mg x1 day, then 10 mg x1 day, and then 5 mg x1 day and stop 11 tablet 0  . predniSONE (DELTASONE) 10 MG tablet Take 4 tabs for 2 days, then 3 tabs for 2 days, 2 tabs for 2 days, then 1 tab for 2 days, then stop. 20 tablet 0  . Spacer/Aero-Holding Chambers (AEROCHAMBER MV) inhaler Use as instructed (Patient not taking: Reported on 12/09/2017) 1 each 0  . tamsulosin (FLOMAX) 0.4 MG CAPS capsule Take 0.4 mg by mouth daily.     . Testosterone (ANDROGEL PUMP) 20.25 MG/ACT (1.62%) GEL Place 2 Squirts onto the skin daily.      Marland Kitchen zolpidem (AMBIEN) 10 MG tablet Take 12.5 mg by mouth at bedtime as needed for sleep.      No current facility-administered medications for this visit.     Review of Systems: Review of Systems  Constitutional: Positive for diaphoresis, fatigue and unexpected weight change.  All other systems reviewed and are negative.    PHYSICAL EXAMINATION Blood pressure (!) 153/71, pulse 69, temperature 98 F (36.7 C), temperature source Oral, resp. rate 20, height 5' 8.5" (1.74 m), weight 271 lb (122.9 kg), SpO2 93 %.  ECOG PERFORMANCE STATUS: 2 - Symptomatic, <50% confined to bed  Physical Exam  Constitutional: He is oriented to person,  place, and time and well-developed, well-nourished, and in no distress. No  distress.  HENT:  Head: Normocephalic and atraumatic.  Mouth/Throat: Oropharynx is clear and moist. No oropharyngeal exudate.  Eyes: Conjunctivae and EOM are normal. Pupils are equal, round, and reactive to light. No scleral icterus.  Neck: No thyromegaly present.  Cardiovascular: Normal rate, regular rhythm and normal heart sounds.  No murmur heard. Pulmonary/Chest: Effort normal and breath sounds normal. No respiratory distress. He has no wheezes. He has no rales.  Abdominal: Soft. Bowel sounds are normal. He exhibits no distension. There is no tenderness. There is no rebound and no guarding.  Musculoskeletal: He exhibits no edema.  Lymphadenopathy:    He has no cervical adenopathy.  Neurological: He is alert and oriented to person, place, and time. He has normal reflexes. No cranial nerve deficit.  Skin: Skin is warm and dry. No rash noted. He is not diaphoretic. No erythema. No pallor.     LABORATORY DATA: I have personally reviewed the data as listed: Appointment on 01/03/2018  Component Date Value Ref Range Status  . IgG (Immunoglobin G), Serum 01/03/2018 1,023  700 - 1,600 mg/dL Final  . IgA 01/03/2018 240  61 - 437 mg/dL Final  . IgM (Immunoglobulin M), Srm 01/03/2018 38  15 - 143 mg/dL Final  . Total Protein ELP 01/03/2018 6.5  6.0 - 8.5 g/dL Corrected  . Albumin SerPl Elph-Mcnc 01/03/2018 3.6  2.9 - 4.4 g/dL Corrected  . Alpha 1 01/03/2018 0.2  0.0 - 0.4 g/dL Corrected  . Alpha2 Glob SerPl Elph-Mcnc 01/03/2018 0.7  0.4 - 1.0 g/dL Corrected  . B-Globulin SerPl Elph-Mcnc 01/03/2018 1.0  0.7 - 1.3 g/dL Corrected  . Gamma Glob SerPl Elph-Mcnc 01/03/2018 0.9  0.4 - 1.8 g/dL Corrected  . M Protein SerPl Elph-Mcnc 01/03/2018 0.3* Not Observed g/dL Corrected  . Globulin, Total 01/03/2018 2.9  2.2 - 3.9 g/dL Corrected  . Albumin/Glob SerPl 01/03/2018 1.3  0.7 - 1.7 Corrected  . IFE 1 01/03/2018 Comment   Corrected   Comment: (NOTE) Immunofixation shows IgG monoclonal protein with  lambda light chain specificity.   . Please Note 01/03/2018 Comment   Corrected   Comment: (NOTE) Protein electrophoresis scan will follow via computer, mail, or courier delivery. Performed At: [facility] [place], [place] [id number] Rush Farmer MD EH:[id number] Performed at [facility], [place] [address], [place], [place] [address]   . TSH 01/03/2018 2.883  0.320 - 4.118 uIU/mL Final   Performed at [facility], [place] [address] [place], [place] [address]  . Folate 01/03/2018 45.9  >5.9 ng/mL Final   Comment: RESULTS CONFIRMED BY MANUAL DILUTION Performed at [facility], [place] [address], [place], [place] [address]   . Sodium 01/03/2018 139  136 - 145 mmol/L Final  . Potassium 01/03/2018 4.2  3.5 - 5.1 mmol/L Final  . Chloride 01/03/2018 102  98 - 109 mmol/L Final  . CO2 01/03/2018 30* 22 - 29 mmol/L Final  . Glucose, Bld 01/03/2018 100  70 - 140 mg/dL Final  . BUN 01/03/2018 18  7 - 26 mg/dL Final  . Creatinine, Ser 01/03/2018 1.08  0.70 - 1.30 mg/dL Final  . Calcium 01/03/2018 9.1  8.4 - 10.4 mg/dL Final  . Total Protein 01/03/2018 6.9  6.4 - 8.3 g/dL Final  . Albumin 01/03/2018 3.8  3.5 - 5.0 g/dL Final  . AST 01/03/2018 18  5 - 34 U/L Final  .  ALT 01/03/2018 14  0 - 55 U/L Final  . Alkaline Phosphatase 01/03/2018 61  40 - 150 U/L Final  . Total Bilirubin 01/03/2018 0.8  0.2 - 1.2 mg/dL Final  . GFR calc non Af Amer 01/03/2018 >60  >60 mL/min Final  . GFR calc Af Amer 01/03/2018 >60  >60 mL/min Final   Comment: (NOTE) The eGFR has been calculated using the CKD EPI equation. This calculation has not been validated in all clinical situations. eGFR's persistently <60 mL/min signify possible Chronic Kidney Disease.   Georgiann Hahn gap 01/03/2018 7  3 - 11 Final   Performed at [facility], [place] [address], [place], [place] [address]  . Kappa free light chain 01/03/2018 18.4  3.3  - 19.4 mg/L Final  . Lamda free light chains 01/03/2018 12.5  5.7 - 26.3 mg/L Final  . Kappa, lamda light chain ratio 01/03/2018 1.47  0.26 - 1.65 Final   Comment: (NOTE) Performed At: [facility] [place], [place] [id number] Rush Farmer MD NT:[id number] Performed at [facility], [place] [address] [place], [place] [address]   . Vitamin B-12 01/03/2018 1,307* 180 - 914 pg/mL Final   Comment: (NOTE) This assay is not validated for testing neonatal or myeloproliferative syndrome specimens for Vitamin B12 levels. Performed at [facility], [place] [address], [place], [place] [address]   . LDH 01/03/2018 172  125 - 245 U/L Final   Performed at [facility], [place] [address] [place], [place] [address]  . WBC Count 01/03/2018 7.2  4.0 - 10.3 K/uL Final  . RBC 01/03/2018 4.47  4.20 - 5.82 MIL/uL Final  . Hemoglobin 01/03/2018 14.1  13.0 - 17.1 g/dL Final  . HCT 01/03/2018 44.8  38.4 - 49.9 % Final  . MCV 01/03/2018 100.2* 79.3 - 98.0 fL Final  . MCH 01/03/2018 31.5  27.2 - 33.4 pg Final  . MCHC 01/03/2018 31.5* 32.0 - 36.0 g/dL Final  . RDW 01/03/2018 13.5  11.0 - 14.6 % Final  . Platelet Count 01/03/2018 209  140 - 400 K/uL Final  . Neutrophils Relative % 01/03/2018 55  % Final  . Neutro Abs 01/03/2018 4.0  1.5 - 6.5 K/uL Final  . Lymphocytes Relative 01/03/2018 29  % Final  . Lymphs Abs 01/03/2018 2.1  0.9 - 3.3 K/uL Final  . Monocytes Relative 01/03/2018 14  % Final  . Monocytes Absolute 01/03/2018 1.0* 0.1 - 0.9 K/uL Final  . Eosinophils Relative 01/03/2018 1  % Final  . Eosinophils Absolute 01/03/2018 0.1  0.0 - 0.5 K/uL Final  . Basophils Relative 01/03/2018 1  % Final  . Basophils Absolute 01/03/2018 0.0  0.0 - 0.1 K/uL Final   Performed at [facility], [place] [address], [place], [place] [address]  . Retic Ct Pct 01/03/2018 1.7  0.8 - 1.8 % Final  . RBC. 01/03/2018 4.47  4.20 -  5.82 MIL/uL Final  . Retic Count, Absolute 01/03/2018 76.0  34.8 - 93.9 K/uL Final   Performed at [facility], [place] [address], [place], [place] [address]       Ardath Sax, MD

## 2018-01-30 NOTE — Assessment & Plan Note (Signed)
71 y.o. male with multiple chronic medical conditions including chronic kidney disease and peripheral neuropathy confined to right lower extremity who was found to have a low-level monoclonal gammopathy of unknown significance in March 2018.  Follow-up testing and straight presence of IgG lambda monoclonal protein with level stable as of July 2018.  Patient does not have anemia, kidney function appears to be reasonably stable, no hypercalcemia.  Additional evaluation reveals no evidence of progressive anemia, or thrombocytopenia.  Renal function is stable without evidence of hypercalcemia.  Skeletal survey negative for lytic skeletal lesions.  At this point, if patient has multiple myeloma with appears to be a smoldering and will not require therapy as of now.  Patient does have some macrocytosis present in his peripheral blood likely attributable to his alcohol consumption.  Plan: -Continue observation for monoclonal gammopathy of unknown significance  due to potential for transformation into multiple myeloma -Return to clinic in 3 months with lab work including full protein panel, B12, folate, and TSH  

## 2018-03-03 ENCOUNTER — Ambulatory Visit: Payer: Medicare Other | Admitting: Internal Medicine

## 2018-03-09 ENCOUNTER — Ambulatory Visit (INDEPENDENT_AMBULATORY_CARE_PROVIDER_SITE_OTHER): Payer: Medicare Other | Admitting: Cardiology

## 2018-03-09 VITALS — BP 126/74 | HR 72 | Wt 271.1 lb

## 2018-03-09 DIAGNOSIS — I420 Dilated cardiomyopathy: Secondary | ICD-10-CM

## 2018-03-09 DIAGNOSIS — E785 Hyperlipidemia, unspecified: Secondary | ICD-10-CM | POA: Diagnosis not present

## 2018-03-09 DIAGNOSIS — G4733 Obstructive sleep apnea (adult) (pediatric): Secondary | ICD-10-CM | POA: Diagnosis not present

## 2018-03-09 DIAGNOSIS — I2510 Atherosclerotic heart disease of native coronary artery without angina pectoris: Secondary | ICD-10-CM | POA: Diagnosis not present

## 2018-03-09 DIAGNOSIS — I10 Essential (primary) hypertension: Secondary | ICD-10-CM | POA: Diagnosis not present

## 2018-03-09 MED ORDER — METOPROLOL TARTRATE 25 MG PO TABS: 25.0000 mg | ORAL_TABLET | Freq: Two times a day (BID) | ORAL | 3 refills | Status: DC

## 2018-03-09 MED ORDER — ATORVASTATIN CALCIUM 20 MG PO TABS: 20.0000 mg | ORAL_TABLET | Freq: Every day | ORAL | 3 refills | Status: DC

## 2018-03-09 NOTE — Progress Notes (Signed)
Cardiology Office Note:    Date:  03/09/2018   ID:  Keith Bernard, DOB 1946-05-27, MRN [id number]  PCP:  [name], MD  Cardiologist:  No primary care provider on file.    Referring MD: [name], MD   Chief Complaint  Patient presents with  . Coronary Artery Disease  . Hypertension  . Sleep Apnea  . Hyperlipidemia    History of Present Illness:    Keith Bernard is a 72 y.o. 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 He has chronic LE edema from his neuropathy but this is stable and well controlled on diuretics.He has moderate obstructive sleep apnea with an AHI of 17.7/h and now on auto BiPAP.  He is here today for followup and is doing well.  He denies any chest pain or pressure, SOB, DOE, PND, orthopnea, LE edema, dizziness, palpitations or syncope. He is compliant with his meds and is tolerating meds with no SE.  He is doing well with his CPAP device.  He tolerates the mask and feels the pressure is adequate.  Since going on CPAP he feels rested in the am and has no significant daytime sleepiness.  He denies any significant mouth or nasal dryness or nasal congestion.  He does not think that he snores.         Past Medical History:  Diagnosis Date  . Asthma   . Chronic kidney disease (CKD), stage III (moderate) (HCC)   . Chronic pain   . 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 Depression 05/04/2016  . DJD (degenerative joint disease)   . Dyslipidemia   . ETOH abuse   . Foot  ulcer ([place]) 07/07/2017  . GERD (gastroesophageal reflux disease)   . Hypertension   . Neuropathy   . Obesity   . OSA (obstructive sleep apnea)    Moderate OSA w AHI 17.7/hr now on VPAP at EEP at 7cm H2O, min PS 3cm H2O,max PS 15cm H2O  . RBBB     Past Surgical History:  Procedure Laterality Date  . CORONARY STENT PLACEMENT    . REPLACEMENT TOTAL KNEE     left knee    Current Medications: Current Meds  Medication Sig  . albuterol (PROAIR HFA) 108 (90 Base) MCG/ACT inhaler Inhale 2 puffs into the lungs every 6 (six) hours as needed for wheezing or shortness of breath.  Marland Kitchen albuterol (PROVENTIL) (2.5 MG/3ML) 0.083% nebulizer solution Take 3 mLs (2.5 mg total) by nebulization every 6 (six) hours as needed for wheezing or shortness of breath.  Marland Kitchen aspirin 81 MG tablet Take 81 mg by mouth daily.    Marland Kitchen atorvastatin (LIPITOR) 20 MG tablet TAKE 1 TABLET BY MOUTH  DAILY  . escitalopram (LEXAPRO) 20 MG tablet Take 20 mg by mouth daily.  Marland Kitchen esomeprazole (NEXIUM) 40 MG capsule Take 40 mg by mouth daily before breakfast.    . fenofibrate 160 MG tablet TAKE 1 TABLET BY MOUTH  DAILY WITH FOOD  .  LORazepam (ATIVAN) 1 MG tablet Take 1 mg by mouth every 8 (eight) hours.  . metoprolol tartrate (LOPRESSOR) 25 MG tablet Take 1 tablet (25 mg total) by mouth 2 (two) times daily.  . Misc Natural Products (COLON CLEANSE PO) Take by mouth.  . montelukast (SINGULAIR) 10 MG tablet TAKE 1 TABLET BY MOUTH AT BEDTIME.  . nitroGLYCERIN (NITROSTAT) 0.4 MG SL tablet Place 0.4 mg under the tongue every 5 (five) minutes as needed.    Marland Kitchen oxyCODONE (ROXICODONE) 15 MG immediate release tablet Take 15 mg by mouth every 4 (four) hours as needed.    Marland Kitchen Spacer/Aero-Holding Chambers (AEROCHAMBER MV) inhaler Use as instructed  . tamsulosin (FLOMAX) 0.4 MG CAPS capsule Take 0.4 mg by mouth daily.   . Testosterone (ANDROGEL PUMP) 20.25 MG/ACT (1.62%) GEL Place 2 Squirts onto the skin daily.    Marland Kitchen zolpidem (AMBIEN) 10 MG tablet Take 10 mg  by mouth at bedtime as needed for sleep.      Allergies:   Patient has no known allergies.   Social History   Socioeconomic History  . Marital status: Married    Spouse name: Not on file  . Number of children: Not on file  . Years of education: Not on file  . Highest education level: Not on file  Occupational History  . Occupation: Risk manager  Social Needs  . Financial resource strain: Not on file  . Food insecurity:    Worry: Not on file    Inability: Not on file  . Transportation needs:    Medical: Not on file    Non-medical: Not on file  Tobacco Use  . Smoking status: Never Smoker  . Smokeless tobacco: Never Used  Substance and Sexual Activity  . Alcohol use: Yes    Alcohol/week: 0.0 oz    Comment: Moderate, beer  . Drug use: No  . Sexual activity: Not on file  Lifestyle  . Physical activity:    Days per week: Not on file    Minutes per session: Not on file  . Stress: Not on file  Relationships  . Social connections:    Talks on phone: Not on file    Gets together: Not on file    Attends religious service: Not on file    Active member of club or organization: Not on file    Attends meetings of clubs or organizations: Not on file    Relationship status: Not on file  Other Topics Concern  . Not on file  Social History Narrative  . Not on file     Family History: The patient's family history includes COPD in his mother; Other in his cousin; Pancreatic cancer in his father.  ROS:   Please see the history of present illness.    ROS  All other systems reviewed and negative.   EKGs/Labs/Other Studies Reviewed:    The following studies were reviewed today: PAP download  EKG:  EKG is not ordered today.   Recent Labs: 10/28/2017: HGB 14.1 01/03/2018: ALT 14; BUN 18; Creatinine, Ser 1.08; Platelet Count 209; Potassium 4.2; Sodium 139; TSH 2.883   Recent Lipid Panel    Component Value Date/Time   CHOL 113 07/07/2017 1023   TRIG 59 07/07/2017  1023   HDL 61 07/07/2017 1023   CHOLHDL 1.9 07/07/2017 1023   CHOLHDL 1.9 05/04/2016 1029   VLDL 13 05/04/2016 1029   LDLCALC 40 07/07/2017 1023   LDLDIRECT 33.0 05/07/2015 1233    Physical Exam:  VS:  BP 126/74   Pulse 72   Wt 271 lb 1.6 oz (123 kg)   SpO2 92%   BMI 38.90 kg/m     Wt Readings from Last 3 Encounters:  03/09/18 271 lb 1.6 oz (123 kg)  01/14/18 271 lb 6.4 oz (123.1 kg)  01/05/18 271 lb (122.9 kg)     GEN:  Well nourished, well developed in no acute distress HEENT: Normal NECK: No JVD; No carotid bruits LYMPHATICS: No lymphadenopathy CARDIAC: RRR, no murmurs, rubs, gallops RESPIRATORY:  Clear to auscultation without rales, wheezing or rhonchi  ABDOMEN: Soft, non-tender, non-distended MUSCULOSKELETAL:  No edema; No deformity  SKIN: Warm and dry NEUROLOGIC:  Alert and oriented x 3 PSYCHIATRIC:  Normal affect   ASSESSMENT:    1. Atherosclerosis of native coronary artery of native heart without angina pectoris   2. Essential hypertension, benign   3. DCM (dilated cardiomyopathy) ([place])   4. Obstructive sleep apnea   5. Dyslipidemia    PLAN:    In order of problems listed above:  1.  ASCAD - status post ST MI anterior with PCI with bare-metal stent of the LAD on 04/12/2009 with cath also showing 20% proximal RCA, 30% mid RCA .  He denies any anginal symptoms.  He will continue on atorvastatin, beta-blocker and aspirin 81 mg daily.  2.  HTN -blood pressure is well controlled on exam today.  He will continue on metoprolol 25 mg twice daily.  3.  DCM -combined ischemic/nonischemic.  EF has normalized on last echo.  4.  OSA - the patient is tolerating PAP therapy well without any problems. The PAP download was reviewed today and showed an AHI of 9.8/hr on auto PAP with 17% compliance in using more than 4 hours nightly.  The patient has been using and benefiting from PAP use and will continue to benefit from therapy.  He is having a lot of problems with sleep  hygiene.  He has 5 dogs sleep in the bedroom with them at night and he wakes up every hour because of the dogs.  His cats apparently jump up and swing from his CPAP tubing while he is asleep and wakes him up as well his wife also says that he has a TV on running all night and will wake him up multiple times at night.  We had a very long heart-to-heart about sleep hygiene.  I told him to get the TV out of the bedroom and make the animal sleep downstairs away from the bedroom.  Discussed the connection between a shortened life span and untreated sleep apnea.  He says that his mask got chewed up by the cats and so he has it taped up and that is probably why it is leaking some.  This was a brand-new mass that he had just gotten.  I encouraged him to call the DME to see if he can get another mask.  His mass leak on his last download was 113.6 L/min.  5.  Hyperlipidemia - his LDL goal is less than 70.  He will continue on Lipitor 20 mg daily and fenofibrate 160 mg daily.  His last LDL was 40 on 07/07/2017.  We will repeat an FLP and ALT.    Medication Adjustments/Labs and Tests Ordered: Current medicines are reviewed at length with the patient today.  Concerns regarding medicines are outlined above.  No orders of the defined types were placed in this encounter.  No orders of the defined types were placed  in this encounter.   Signed, Fransico Him, MD  03/09/2018 1:16 PM    [facility]

## 2018-03-09 NOTE — Patient Instructions (Addendum)
Medication Instructions:  Your physician recommends that you continue on your current medications as directed. Please refer to the Current Medication list given to you today.  If you need a refill on your cardiac medications, please contact your pharmacy first.  Labwork: Your physician recommends that you return for lab work in: 1 week for liver and cholesterol check    Testing/Procedures: None ordered   Follow-Up: Your physician wants you to follow-up in:6 months with Dr. Radford Pax. You will receive a reminder letter in the mail two months in advance. If you don't receive a letter, please call our office to schedule the follow-up appointment.  Any Other Special Instructions Will Be Listed Below (If Applicable).   Thank you for choosing [facility], RN  [phone]  If you need a refill on your cardiac medications before your next appointment, please call your pharmacy.

## 2018-03-11 ENCOUNTER — Other Ambulatory Visit: Payer: Medicare Other

## 2018-03-15 ENCOUNTER — Other Ambulatory Visit: Payer: Medicare Other | Admitting: *Deleted

## 2018-03-15 DIAGNOSIS — E785 Hyperlipidemia, unspecified: Secondary | ICD-10-CM

## 2018-03-16 ENCOUNTER — Other Ambulatory Visit: Payer: Medicare Other

## 2018-03-16 LAB — HEPATIC FUNCTION PANEL
ALBUMIN: 4.1 g/dL (ref 3.5–4.8)
ALT: 11 IU/L (ref 0–44)
AST: 19 IU/L (ref 0–40)
Alkaline Phosphatase: 50 IU/L (ref 39–117)
BILIRUBIN TOTAL: 0.9 mg/dL (ref 0.0–1.2)
BILIRUBIN, DIRECT: 0.39 mg/dL (ref 0.00–0.40)
TOTAL PROTEIN: 6.7 g/dL (ref 6.0–8.5)

## 2018-03-16 LAB — LIPID PANEL
CHOL/HDL RATIO: 2 ratio (ref 0.0–5.0)
Cholesterol, Total: 122 mg/dL (ref 100–199)
HDL: 62 mg/dL (ref >39)
LDL Calculated: 49 mg/dL (ref 0–99)
Triglycerides: 55 mg/dL (ref 0–149)
VLDL CHOLESTEROL CAL: 11 mg/dL (ref 5–40)

## 2018-03-28 ENCOUNTER — Inpatient Hospital Stay: Payer: Medicare Other | Attending: Hematology and Oncology

## 2018-03-28 DIAGNOSIS — D472 Monoclonal gammopathy: Secondary | ICD-10-CM | POA: Insufficient documentation

## 2018-03-29 ENCOUNTER — Inpatient Hospital Stay: Payer: Medicare Other

## 2018-03-29 DIAGNOSIS — D472 Monoclonal gammopathy: Secondary | ICD-10-CM | POA: Diagnosis present

## 2018-03-29 LAB — CMP (CANCER CENTER ONLY)
ALT: 12 U/L (ref 0–55)
AST: 23 U/L (ref 5–34)
Albumin: 4.2 g/dL (ref 3.5–5.0)
Alkaline Phosphatase: 75 U/L (ref 40–150)
Anion gap: 8 (ref 3–11)
BUN: 31 mg/dL — AB (ref 7–26)
CHLORIDE: 99 mmol/L (ref 98–109)
CO2: 29 mmol/L (ref 22–29)
CREATININE: 1.07 mg/dL (ref 0.70–1.30)
Calcium: 10 mg/dL (ref 8.4–10.4)
GFR, Est AFR Am: >60 mL/min (ref >60)
GFR, Estimated: >60 mL/min (ref >60)
Glucose, Bld: 94 mg/dL (ref 70–140)
POTASSIUM: 4.6 mmol/L (ref 3.5–5.1)
SODIUM: 136 mmol/L (ref 136–145)
Total Bilirubin: 0.9 mg/dL (ref 0.2–1.2)
Total Protein: 7.3 g/dL (ref 6.4–8.3)

## 2018-03-29 LAB — LACTATE DEHYDROGENASE: LDH: 201 U/L (ref 125–245)

## 2018-03-29 LAB — CBC WITH DIFFERENTIAL (CANCER CENTER ONLY)
BASOS ABS: 0.1 10*3/uL (ref 0.0–0.1)
BASOS PCT: 1 %
EOS ABS: 0.3 10*3/uL (ref 0.0–0.5)
EOS PCT: 6 %
HCT: 41.8 % (ref 38.4–49.9)
Hemoglobin: 13.7 g/dL (ref 13.0–17.1)
Lymphocytes Relative: 22 %
Lymphs Abs: 1.3 10*3/uL (ref 0.9–3.3)
MCH: 31.4 pg (ref 27.2–33.4)
MCHC: 32.8 g/dL (ref 32.0–36.0)
MCV: 95.8 fL (ref 79.3–98.0)
Monocytes Absolute: 0.5 10*3/uL (ref 0.1–0.9)
Monocytes Relative: 9 %
Neutro Abs: 3.7 10*3/uL (ref 1.5–6.5)
Neutrophils Relative %: 62 %
PLATELETS: 209 10*3/uL (ref 140–400)
RBC: 4.36 MIL/uL (ref 4.20–5.82)
RDW: 13.9 % (ref 11.0–14.6)
WBC: 5.9 10*3/uL (ref 4.0–10.3)

## 2018-03-30 LAB — KAPPA/LAMBDA LIGHT CHAINS
Kappa free light chain: 23.7 mg/L — ABNORMAL HIGH (ref 3.3–19.4)
Kappa, lambda light chain ratio: 1.39 (ref 0.26–1.65)
Lambda free light chains: 17.1 mg/L (ref 5.7–26.3)

## 2018-03-30 LAB — BETA 2 MICROGLOBULIN, SERUM: BETA 2 MICROGLOBULIN: 2.3 mg/L (ref 0.6–2.4)

## 2018-04-01 LAB — MULTIPLE MYELOMA PANEL, SERUM
ALBUMIN SERPL ELPH-MCNC: 3.9 g/dL (ref 2.9–4.4)
ALBUMIN/GLOB SERPL: 1.4 (ref 0.7–1.7)
ALPHA 1: 0.3 g/dL (ref 0.0–0.4)
ALPHA2 GLOB SERPL ELPH-MCNC: 0.7 g/dL (ref 0.4–1.0)
B-Globulin SerPl Elph-Mcnc: 1.1 g/dL (ref 0.7–1.3)
Gamma Glob SerPl Elph-Mcnc: 0.9 g/dL (ref 0.4–1.8)
Globulin, Total: 2.9 g/dL (ref 2.2–3.9)
IGG (IMMUNOGLOBIN G), SERUM: 1081 mg/dL (ref 700–1600)
IGM (IMMUNOGLOBULIN M), SRM: 35 mg/dL (ref 15–143)
IgA: 258 mg/dL (ref 61–437)
M Protein SerPl Elph-Mcnc: 0.3 g/dL — ABNORMAL HIGH
TOTAL PROTEIN ELP: 6.8 g/dL (ref 6.0–8.5)

## 2018-04-04 ENCOUNTER — Telehealth: Payer: Self-pay | Admitting: Hematology and Oncology

## 2018-04-04 ENCOUNTER — Inpatient Hospital Stay: Payer: Medicare Other | Attending: Hematology and Oncology | Admitting: Hematology and Oncology

## 2018-04-04 ENCOUNTER — Encounter: Payer: Self-pay | Admitting: Hematology and Oncology

## 2018-04-04 VITALS — BP 124/57 | HR 74 | Temp 98.7°F | Resp 17 | Ht 70.0 in | Wt 264.0 lb

## 2018-04-04 DIAGNOSIS — K219 Gastro-esophageal reflux disease without esophagitis: Secondary | ICD-10-CM

## 2018-04-04 DIAGNOSIS — D7589 Other specified diseases of blood and blood-forming organs: Secondary | ICD-10-CM

## 2018-04-04 DIAGNOSIS — I2510 Atherosclerotic heart disease of native coronary artery without angina pectoris: Secondary | ICD-10-CM | POA: Insufficient documentation

## 2018-04-04 DIAGNOSIS — Z9989 Dependence on other enabling machines and devices: Secondary | ICD-10-CM | POA: Insufficient documentation

## 2018-04-04 DIAGNOSIS — I255 Ischemic cardiomyopathy: Secondary | ICD-10-CM | POA: Diagnosis not present

## 2018-04-04 DIAGNOSIS — D472 Monoclonal gammopathy: Secondary | ICD-10-CM | POA: Diagnosis present

## 2018-04-04 DIAGNOSIS — Z7982 Long term (current) use of aspirin: Secondary | ICD-10-CM

## 2018-04-04 DIAGNOSIS — R251 Tremor, unspecified: Secondary | ICD-10-CM | POA: Diagnosis not present

## 2018-04-04 DIAGNOSIS — I4510 Unspecified right bundle-branch block: Secondary | ICD-10-CM | POA: Diagnosis not present

## 2018-04-04 DIAGNOSIS — E785 Hyperlipidemia, unspecified: Secondary | ICD-10-CM | POA: Diagnosis not present

## 2018-04-04 DIAGNOSIS — G629 Polyneuropathy, unspecified: Secondary | ICD-10-CM | POA: Diagnosis not present

## 2018-04-04 DIAGNOSIS — Z79899 Other long term (current) drug therapy: Secondary | ICD-10-CM | POA: Diagnosis not present

## 2018-04-04 DIAGNOSIS — Z9981 Dependence on supplemental oxygen: Secondary | ICD-10-CM

## 2018-04-04 DIAGNOSIS — Z800 Family history of malignant neoplasm of digestive organs: Secondary | ICD-10-CM

## 2018-04-04 DIAGNOSIS — I129 Hypertensive chronic kidney disease with stage 1 through stage 4 chronic kidney disease, or unspecified chronic kidney disease: Secondary | ICD-10-CM | POA: Diagnosis not present

## 2018-04-04 DIAGNOSIS — G4733 Obstructive sleep apnea (adult) (pediatric): Secondary | ICD-10-CM | POA: Diagnosis not present

## 2018-04-04 DIAGNOSIS — R5383 Other fatigue: Secondary | ICD-10-CM | POA: Diagnosis not present

## 2018-04-04 DIAGNOSIS — I252 Old myocardial infarction: Secondary | ICD-10-CM | POA: Insufficient documentation

## 2018-04-04 DIAGNOSIS — N183 Chronic kidney disease, stage 3 (moderate): Secondary | ICD-10-CM

## 2018-04-04 NOTE — Telephone Encounter (Signed)
Scheduled appt per 5/6 los -Gave patient AVS and calender per los.  

## 2018-04-24 NOTE — Progress Notes (Signed)
[facility] Follow-up Visit:  Assessment: MGUS (monoclonal gammopathy of unknown significance) 72 y.o. male with multiple chronic medical conditions including chronic kidney disease and peripheral neuropathy confined to right lower extremity who was found to have a low-level monoclonal gammopathy of unknown significance in March 2018.  Follow-up testing and straight presence of IgG lambda monoclonal protein with level stable as of July 2018.  Patient does not have anemia, kidney function appears to be reasonably stable, no hypercalcemia.  Additional evaluation reveals no evidence of progressive anemia, or thrombocytopenia.  Renal function is stable without evidence of hypercalcemia.  Skeletal survey negative for lytic skeletal lesions.  At this point, if patient has multiple myeloma with appears to be a smoldering and will not require therapy as of now.  Patient does have some macrocytosis present in his peripheral blood likely attributable to his alcohol consumption.  No significant hematological progression noted on the most recent lab work.  Plan: -Continue observation for monoclonal gammopathy of unknown significance  due to potential for transformation into multiple myeloma -Return to clinic in 3 months with lab work obtained 1 week prior  Voice recognition software was used and creation of this note. Despite my best effort at editing the text, some misspelling/errors may have occurred.  Orders Placed This Encounter  Procedures  . CBC with Differential (Cancer Center Only)    Standing Status:   Future    Standing Expiration Date:   04/05/2019  . CMP ([facility] only)    Standing Status:   Future    Standing Expiration Date:   04/05/2019  . Lactate dehydrogenase (LDH)    Standing Status:   Future    Standing Expiration Date:   04/04/2019  . Beta 2 microglobulin, serum    Standing Status:   Future    Standing Expiration Date:   04/05/2019  . Multiple Myeloma Panel  (SPEP&IFE w/QIG)    Standing Status:   Future    Standing Expiration Date:   04/04/2019  . Kappa/lambda light chains    Standing Status:   Future    Standing Expiration Date:   04/04/2019    Cancer Staging No matching staging information was found for the patient.  All questions were answered. . The patient knows to call the clinic with any problems, questions or concerns.  This note was electronically signed.    History of Presenting Illness CAISON HEARN is a 72 y.o. male following in the [facility] for monoclonal gammopathy of unknown significance (MGUS), referred by Dr Rexene Agent.  Please see hematological/oncological history below for details.  His past medical history significant for coronary artery disease with previous PCI, obesity, history of right total knee replacement, history of right lower extremity neuropathy with drop food development, asthma, obstructive sleep apnea with intermittent use of CPAP, chronic kidney disease, stage III.,  Patient's family history is significant for paternal cousin with history of amyloidosis.  Patient returns to the clinic for continued hematological monitoring.  Denies any new complaints compared to the last visit in the clinic.  At the present time, patient complains of persistent fatigue.  Patient did have a bout of night sweats 2-3 months ago, but those have resolved.  His activity level has been significantly decreased and he has gained some weight which he attributes to inactivity.  Patient denies chest pain, shortness of breath, or cough.  No nausea, vomiting, abdominal pain, diarrhea, or constipation.  No dysuria or hematuria.  Additionally, patient is complaining  of tremors and memory problems.  Continues to complain of lower extremity neuropathy.  Continues to drink approximately 6 ounces of alcohol per night.   Oncological/hematological History: --Labs, 02/22/17:               SPEP -- M-Spike 0.2g/dL, SIFE -- positive for IgG  lambda monoclonal protein; IgG   964, IgA 251, IgM 35; kappa 23.7, lambda 20.6, KLR 1.15;  --Labs, 06/22/17:               SPEP -- M-Spike 0.2g/dL, SIFE -- positive for IgG lambda monoclonal protein; IgG   946, IgA 222, IgM 44; kappa 17.0, lambda 17.5, KLR 0.97; WBC 7.1, Hgb 14.9, Plt 226; Cr 0.94, Ca 9.8 --Labs, 09/16/17: WBC 7.4, Hgb 13.7, Plt 191 --Labs, 10/28/17: tProt 7.2, Alb 3.8, Ca   9.3, Cr 1.0, AP 60, LDH 208, beta-2 microglobulin 2.5; WBC 8.0, Hgb 14.1, MCV 102.6, MCH 33.0, RDW 13.5, Plt 201 --Skeletal Survey, 10/28/17: No evidence of skeletal lytic lesions. --Labs, 01/03/18: tProt 6.9, Alb 3.8, Ca   9.1, Cr 1.1, AP 61;       SPEP -- MSpike 0.3g/dL, SIFE -- IgG lambda monoclonal protein; IgG 1023, IgA 240, IgM 38; kappa 18.4, lambda 12.5, KLR 1.47; WBC 7.2, Hgb 14.1, Plt 209; Vit B12 1307; TSH 2.88 --Labs, 03/29/18: tProt 7.3, Alb 4.2, Ca 10.0, Cr 1.1, AP 75, LDH 201, beta-2 microglobulin 2.3;  SPEP -- MSpike 0.3g/dL, SIFE -- IgG lambda monoclonal protein; IgG 1081, IgA 258, IgM 35; kappa 23.7, lambda 17.1, KLR 1.39; WBC 5.9, Hgb 13.7, Plt 209;    No history exists.    Medical History: Past Medical History:  Diagnosis Date  . Asthma   . Chronic kidney disease (CKD), stage III (moderate) (HCC)   . Chronic pain   . 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 Depression 05/04/2016  . DJD (degenerative joint disease)   . Dyslipidemia   . ETOH abuse   . Foot ulcer ([place]) 07/07/2017  . GERD (gastroesophageal reflux disease)   . Hypertension   . Neuropathy   . Obesity   . OSA (obstructive sleep apnea)    Moderate OSA w AHI 17.7/hr now on VPAP at EEP at 7cm H2O, min PS 3cm H2O,max PS 15cm H2O  . RBBB     Surgical History: Past Surgical History:  Procedure Laterality Date  . CORONARY STENT PLACEMENT    . REPLACEMENT TOTAL KNEE      left knee    Family History: Family History  Problem Relation Age of Onset  . COPD Mother   . Pancreatic cancer Father        pancreatic   . Other Cousin        Amyloidosis    Social History: Social History   Socioeconomic History  . Marital status: Married    Spouse name: Not on file  . Number of children: Not on file  . Years of education: Not on file  . Highest education level: Not on file  Occupational History  . Occupation: Risk manager  Social Needs  . Financial resource strain: Not on file  . Food insecurity:    Worry: Not on file    Inability: Not on file  . Transportation needs:    Medical: Not on file  Non-medical: Not on file  Tobacco Use  . Smoking status: Never Smoker  . Smokeless tobacco: Never Used  Substance and Sexual Activity  . Alcohol use: Yes    Alcohol/week: 0.0 oz    Comment: Moderate, beer  . Drug use: No  . Sexual activity: Not on file  Lifestyle  . Physical activity:    Days per week: Not on file    Minutes per session: Not on file  . Stress: Not on file  Relationships  . Social connections:    Talks on phone: Not on file    Gets together: Not on file    Attends religious service: Not on file    Active member of club or organization: Not on file    Attends meetings of clubs or organizations: Not on file    Relationship status: Not on file  . Intimate partner violence:    Fear of current or ex partner: Not on file    Emotionally abused: Not on file    Physically abused: Not on file    Forced sexual activity: Not on file  Other Topics Concern  . Not on file  Social History Narrative  . Not on file    Allergies: No Known Allergies  Medications:  Current Outpatient Medications  Medication Sig Dispense Refill  . albuterol (PROAIR HFA) 108 (90 Base) MCG/ACT inhaler Inhale 2 puffs into the lungs every 6 (six) hours as needed for wheezing or shortness of breath. 1 Inhaler 5  . albuterol (PROVENTIL) (2.5 MG/3ML)  0.083% nebulizer solution Take 3 mLs (2.5 mg total) by nebulization every 6 (six) hours as needed for wheezing or shortness of breath. 75 mL 5  . aspirin 81 MG tablet Take 81 mg by mouth daily.      Marland Kitchen atorvastatin (LIPITOR) 20 MG tablet Take 1 tablet (20 mg total) by mouth daily. 90 tablet 3  . escitalopram (LEXAPRO) 20 MG tablet Take 20 mg by mouth daily.    Marland Kitchen esomeprazole (NEXIUM) 40 MG capsule Take 40 mg by mouth daily before breakfast.      . fenofibrate 160 MG tablet TAKE 1 TABLET BY MOUTH  DAILY WITH FOOD 90 tablet 1  . LORazepam (ATIVAN) 1 MG tablet Take 1 mg by mouth every 8 (eight) hours.    . metoprolol tartrate (LOPRESSOR) 25 MG tablet Take 1 tablet (25 mg total) by mouth 2 (two) times daily. 180 tablet 3  . Misc Natural Products (COLON CLEANSE PO) Take by mouth.    . montelukast (SINGULAIR) 10 MG tablet TAKE 1 TABLET BY MOUTH AT BEDTIME. 90 tablet 3  . nitroGLYCERIN (NITROSTAT) 0.4 MG SL tablet Place 0.4 mg under the tongue every 5 (five) minutes as needed.      Marland Kitchen oxyCODONE (ROXICODONE) 15 MG immediate release tablet Take 15 mg by mouth every 4 (four) hours as needed.      Marland Kitchen Spacer/Aero-Holding Chambers (AEROCHAMBER MV) inhaler Use as instructed 1 each 0  . tamsulosin (FLOMAX) 0.4 MG CAPS capsule Take 0.4 mg by mouth daily.     Marland Kitchen zolpidem (AMBIEN) 10 MG tablet Take 10 mg by mouth at bedtime as needed for sleep.      No current facility-administered medications for this visit.     Review of Systems: Review of Systems  Constitutional: Positive for diaphoresis, fatigue and unexpected weight change.  All other systems reviewed and are negative.    PHYSICAL EXAMINATION Blood pressure (!) 124/57, pulse 74, temperature 98.7 F (37.1 C), temperature  source Oral, resp. rate 17, height 5' 10"  (1.778 m), weight 264 lb (119.7 kg), SpO2 95 %.  ECOG PERFORMANCE STATUS: 2 - Symptomatic, <50% confined to bed  Physical Exam  Constitutional: He is oriented to person, place, and time and  well-developed, well-nourished, and in no distress. No distress.  HENT:  Head: Normocephalic and atraumatic.  Mouth/Throat: Oropharynx is clear and moist. No oropharyngeal exudate.  Eyes: Pupils are equal, round, and reactive to light. Conjunctivae and EOM are normal. No scleral icterus.  Neck: No thyromegaly present.  Cardiovascular: Normal rate, regular rhythm and normal heart sounds.  No murmur heard. Pulmonary/Chest: Effort normal and breath sounds normal. No respiratory distress. He has no wheezes. He has no rales.  Abdominal: Soft. Bowel sounds are normal. He exhibits no distension. There is no tenderness. There is no rebound and no guarding.  Musculoskeletal: He exhibits no edema.  Lymphadenopathy:    He has no cervical adenopathy.  Neurological: He is alert and oriented to person, place, and time. He has normal reflexes. No cranial nerve deficit.  Skin: Skin is warm and dry. No rash noted. He is not diaphoretic. No erythema. No pallor.     LABORATORY DATA: I have personally reviewed the data as listed: Appointment on 03/29/2018  Component Date Value Ref Range Status  . Beta-2 Microglobulin 03/29/2018 2.3  0.6 - 2.4 mg/L Final   Comment: (NOTE) Siemens Immulite 2000 Immunochemiluminometric assay (ICMA) Values obtained with different assay methods or kits cannot be used interchangeably. Results cannot be interpreted as absolute evidence of the presence or absence of malignant disease. Performed At: [facility] [place], [place] [id number] Rush Farmer MD [id number] Performed at [facility], [place] [address] [place], [place] [address]   . Kappa free light chain 03/29/2018 23.7* 3.3 - 19.4 mg/L Final  . Lamda free light chains 03/29/2018 17.1  5.7 - 26.3 mg/L Final  . Kappa, lamda light chain ratio 03/29/2018 1.39  0.26 - 1.65 Final   Comment: (NOTE) Performed At: [facility] [place], [place]  [id number] Rush Farmer MD [id number] Performed at [facility], [place] [address], [place], [place] [address]   . IgG (Immunoglobin G), Serum 03/29/2018 1,081  700 - 1,600 mg/dL Final  . IgA 03/29/2018 258  61 - 437 mg/dL Final  . IgM (Immunoglobulin M), Srm 03/29/2018 35  15 - 143 mg/dL Final  . Total Protein ELP 03/29/2018 6.8  6.0 - 8.5 g/dL Corrected  . Albumin SerPl Elph-Mcnc 03/29/2018 3.9  2.9 - 4.4 g/dL Corrected  . Alpha 1 03/29/2018 0.3  0.0 - 0.4 g/dL Corrected  . Alpha2 Glob SerPl Elph-Mcnc 03/29/2018 0.7  0.4 - 1.0 g/dL Corrected  . B-Globulin SerPl Elph-Mcnc 03/29/2018 1.1  0.7 - 1.3 g/dL Corrected  . Gamma Glob SerPl Elph-Mcnc 03/29/2018 0.9  0.4 - 1.8 g/dL Corrected  . M Protein SerPl Elph-Mcnc 03/29/2018 0.3* Not Observed g/dL Corrected  . Globulin, Total 03/29/2018 2.9  2.2 - 3.9 g/dL Corrected  . Albumin/Glob SerPl 03/29/2018 1.4  0.7 - 1.7 Corrected  . IFE 1 03/29/2018 Comment   Corrected   Comment: (NOTE) Immunofixation shows IgG monoclonal protein with lambda light chain specificity.   . Please Note 03/29/2018 Comment   Corrected   Comment: (NOTE) Protein electrophoresis scan will follow via computer, mail, or courier delivery. Performed At: [facility] [place], [place] [id number] Rush Farmer MD [id number] Performed at [facility], [place] [address] [place], [place] [address]   . LDH 03/29/2018 201  125 - 245 U/L Final   Performed at [facility], [place] [address] [place], [place] [address]  . Sodium 03/29/2018 136  136 - 145 mmol/L Final  . Potassium 03/29/2018 4.6  3.5 - 5.1 mmol/L Final  . Chloride 03/29/2018 99  98 - 109 mmol/L Final  . CO2 03/29/2018 29  22 - 29 mmol/L Final  . Glucose, Bld 03/29/2018 94  70 - 140 mg/dL Final  . BUN 03/29/2018 31* 7 - 26 mg/dL Final  . Creatinine 03/29/2018 1.07  0.70 - 1.30 mg/dL Final  . Calcium 03/29/2018  10.0  8.4 - 10.4 mg/dL Final  . Total Protein 03/29/2018 7.3  6.4 - 8.3 g/dL Final  . Albumin 03/29/2018 4.2  3.5 - 5.0 g/dL Final  . AST 03/29/2018 23  5 - 34 U/L Final  . ALT 03/29/2018 12  0 - 55 U/L Final  . Alkaline Phosphatase 03/29/2018 75  40 - 150 U/L Final  . Total Bilirubin 03/29/2018 0.9  0.2 - 1.2 mg/dL Final  . GFR, Est Non Af Am 03/29/2018 >60  >60 mL/min Final  . GFR, Est AFR Am 03/29/2018 >60  >60 mL/min Final   Comment: (NOTE) The eGFR has been calculated using the CKD EPI equation. This calculation has not been validated in all clinical situations. eGFR's persistently <60 mL/min signify possible Chronic Kidney Disease.   Georgiann Hahn gap 03/29/2018 8  3 - 11 Final   Performed at [facility], [place] [address] [place],  [address]  . WBC Count 03/29/2018 5.9  4.0 - 10.3 K/uL Final  . RBC 03/29/2018 4.36  4.20 - 5.82 MIL/uL Final  . Hemoglobin 03/29/2018 13.7  13.0 - 17.1 g/dL Final  . HCT 03/29/2018 41.8  38.4 - 49.9 % Final  . MCV 03/29/2018 95.8  79.3 - 98.0 fL Final  . MCH 03/29/2018 31.4  27.2 - 33.4 pg Final  . MCHC 03/29/2018 32.8  32.0 - 36.0 g/dL Final  . RDW 03/29/2018 13.9  11.0 - 14.6 % Final  . Platelet Count 03/29/2018 209  140 - 400 K/uL Final  . Neutrophils Relative % 03/29/2018 62  % Final  . Neutro Abs 03/29/2018 3.7  1.5 - 6.5 K/uL Final  . Lymphocytes Relative 03/29/2018 22  % Final  . Lymphs Abs 03/29/2018 1.3  0.9 - 3.3 K/uL Final  . Monocytes Relative 03/29/2018 9  % Final  . Monocytes Absolute 03/29/2018 0.5  0.1 - 0.9 K/uL Final  . Eosinophils Relative 03/29/2018 6  % Final  . Eosinophils Absolute 03/29/2018 0.3  0.0 - 0.5 K/uL Final  . Basophils Relative 03/29/2018 1  % Final  . Basophils Absolute 03/29/2018 0.1  0.0 - 0.1 K/uL Final   Performed at [facility], [place] [address], [place],  [address]       Ardath Sax, MD

## 2018-04-24 NOTE — Assessment & Plan Note (Signed)
72 y.o. male with multiple chronic medical conditions including chronic kidney disease and peripheral neuropathy confined to right lower extremity who was found to have a low-level monoclonal gammopathy of unknown significance in March 2018.  Follow-up testing and straight presence of IgG lambda monoclonal protein with level stable as of July 2018.  Patient does not have anemia, kidney function appears to be reasonably stable, no hypercalcemia.  Additional evaluation reveals no evidence of progressive anemia, or thrombocytopenia.  Renal function is stable without evidence of hypercalcemia.  Skeletal survey negative for lytic skeletal lesions.  At this point, if patient has multiple myeloma with appears to be a smoldering and will not require therapy as of now.  Patient does have some macrocytosis present in his peripheral blood likely attributable to his alcohol consumption.  No significant hematological progression noted on the most recent lab work.  Plan: -Continue observation for monoclonal gammopathy of unknown significance  due to potential for transformation into multiple myeloma -Return to clinic in 3 months with lab work obtained 1 week prior

## 2018-05-17 ENCOUNTER — Other Ambulatory Visit: Payer: Self-pay | Admitting: Internal Medicine

## 2018-05-17 ENCOUNTER — Encounter: Payer: Self-pay | Admitting: Internal Medicine

## 2018-05-18 ENCOUNTER — Ambulatory Visit: Payer: Medicare Other | Admitting: Internal Medicine

## 2018-06-03 ENCOUNTER — Other Ambulatory Visit: Payer: Self-pay

## 2018-06-03 MED ORDER — NITROGLYCERIN 0.4 MG SL SUBL: 0.4000 mg | SUBLINGUAL_TABLET | SUBLINGUAL | 3 refills | Status: DC | PRN

## 2018-06-08 ENCOUNTER — Telehealth: Payer: Self-pay | Admitting: Internal Medicine

## 2018-06-08 NOTE — Telephone Encounter (Signed)
Spoke with pt, advised that we didn't have any Dulera samples. Pt is stating the Keith Bernard is 450 dollars. He has reached his doughnut hole. Can he have samples of Breo or Symbicort until we can figure something out. He is running out of his Keith Bernard soon and [name] is not back until the middle of next week so I will send this to the DOD. Please advise.

## 2018-06-08 NOTE — Telephone Encounter (Signed)
Ok to give samples of breo 100 or symbicort 80

## 2018-06-08 NOTE — Telephone Encounter (Signed)
Spoke with pt, advised I would leave Symbicort samples up front for him to pick up. Pt understood and will come by tomorrow to pick them up. Nothing further is needed.

## 2018-06-23 ENCOUNTER — Ambulatory Visit: Payer: Medicare Other | Admitting: Internal Medicine

## 2018-07-04 ENCOUNTER — Inpatient Hospital Stay: Payer: Medicare Other

## 2018-07-05 ENCOUNTER — Inpatient Hospital Stay: Payer: Medicare Other | Attending: Hematology

## 2018-07-05 DIAGNOSIS — Z7982 Long term (current) use of aspirin: Secondary | ICD-10-CM | POA: Insufficient documentation

## 2018-07-05 DIAGNOSIS — R5383 Other fatigue: Secondary | ICD-10-CM | POA: Diagnosis not present

## 2018-07-05 DIAGNOSIS — I252 Old myocardial infarction: Secondary | ICD-10-CM | POA: Diagnosis not present

## 2018-07-05 DIAGNOSIS — I4510 Unspecified right bundle-branch block: Secondary | ICD-10-CM | POA: Diagnosis not present

## 2018-07-05 DIAGNOSIS — I129 Hypertensive chronic kidney disease with stage 1 through stage 4 chronic kidney disease, or unspecified chronic kidney disease: Secondary | ICD-10-CM | POA: Insufficient documentation

## 2018-07-05 DIAGNOSIS — D472 Monoclonal gammopathy: Secondary | ICD-10-CM | POA: Diagnosis not present

## 2018-07-05 DIAGNOSIS — E785 Hyperlipidemia, unspecified: Secondary | ICD-10-CM | POA: Insufficient documentation

## 2018-07-05 DIAGNOSIS — N183 Chronic kidney disease, stage 3 (moderate): Secondary | ICD-10-CM | POA: Diagnosis not present

## 2018-07-05 DIAGNOSIS — G4733 Obstructive sleep apnea (adult) (pediatric): Secondary | ICD-10-CM | POA: Diagnosis not present

## 2018-07-05 DIAGNOSIS — I2510 Atherosclerotic heart disease of native coronary artery without angina pectoris: Secondary | ICD-10-CM | POA: Insufficient documentation

## 2018-07-05 DIAGNOSIS — G479 Sleep disorder, unspecified: Secondary | ICD-10-CM | POA: Insufficient documentation

## 2018-07-05 DIAGNOSIS — Z79899 Other long term (current) drug therapy: Secondary | ICD-10-CM | POA: Diagnosis not present

## 2018-07-05 DIAGNOSIS — K219 Gastro-esophageal reflux disease without esophagitis: Secondary | ICD-10-CM | POA: Insufficient documentation

## 2018-07-05 DIAGNOSIS — I255 Ischemic cardiomyopathy: Secondary | ICD-10-CM | POA: Insufficient documentation

## 2018-07-05 LAB — CBC WITH DIFFERENTIAL (CANCER CENTER ONLY)
Basophils Absolute: 0 10*3/uL (ref 0.0–0.1)
Basophils Relative: 1 %
EOS PCT: 7 %
Eosinophils Absolute: 0.3 10*3/uL (ref 0.0–0.5)
HEMATOCRIT: 37.8 % — AB (ref 38.4–49.9)
Hemoglobin: 12.7 g/dL — ABNORMAL LOW (ref 13.0–17.1)
LYMPHS PCT: 28 %
Lymphs Abs: 1.2 10*3/uL (ref 0.9–3.3)
MCH: 32.4 pg (ref 27.2–33.4)
MCHC: 33.5 g/dL (ref 32.0–36.0)
MCV: 96.8 fL (ref 79.3–98.0)
MONO ABS: 0.4 10*3/uL (ref 0.1–0.9)
MONOS PCT: 9 %
NEUTROS ABS: 2.4 10*3/uL (ref 1.5–6.5)
Neutrophils Relative %: 55 %
PLATELETS: 178 10*3/uL (ref 140–400)
RBC: 3.91 MIL/uL — ABNORMAL LOW (ref 4.20–5.82)
RDW: 13 % (ref 11.0–14.6)
WBC Count: 4.3 10*3/uL (ref 4.0–10.3)

## 2018-07-05 LAB — CMP (CANCER CENTER ONLY)
ALBUMIN: 3.8 g/dL (ref 3.5–5.0)
ALK PHOS: 62 U/L (ref 38–126)
ALT: 12 U/L (ref 0–44)
ANION GAP: 10 (ref 5–15)
AST: 18 U/L (ref 15–41)
BILIRUBIN TOTAL: 0.7 mg/dL (ref 0.3–1.2)
BUN: 18 mg/dL (ref 8–23)
CALCIUM: 9 mg/dL (ref 8.9–10.3)
CO2: 29 mmol/L (ref 22–32)
Chloride: 101 mmol/L (ref 98–111)
Creatinine: 0.94 mg/dL (ref 0.61–1.24)
GFR, Est AFR Am: >60 mL/min (ref >60)
GFR, Estimated: >60 mL/min (ref >60)
GLUCOSE: 102 mg/dL — AB (ref 70–99)
POTASSIUM: 4.3 mmol/L (ref 3.5–5.1)
SODIUM: 140 mmol/L (ref 135–145)
TOTAL PROTEIN: 7.1 g/dL (ref 6.5–8.1)

## 2018-07-05 LAB — LACTATE DEHYDROGENASE: LDH: 154 U/L (ref 98–192)

## 2018-07-06 LAB — KAPPA/LAMBDA LIGHT CHAINS
KAPPA FREE LGHT CHN: 23.9 mg/L — AB (ref 3.3–19.4)
Kappa, lambda light chain ratio: 1.32 (ref 0.26–1.65)
Lambda free light chains: 18.1 mg/L (ref 5.7–26.3)

## 2018-07-06 LAB — BETA 2 MICROGLOBULIN, SERUM: BETA 2 MICROGLOBULIN: 2.5 mg/L — AB (ref 0.6–2.4)

## 2018-07-07 LAB — MULTIPLE MYELOMA PANEL, SERUM
ALPHA 1: 0.3 g/dL (ref 0.0–0.4)
ALPHA2 GLOB SERPL ELPH-MCNC: 0.8 g/dL (ref 0.4–1.0)
Albumin SerPl Elph-Mcnc: 3.4 g/dL (ref 2.9–4.4)
Albumin/Glob SerPl: 1.1 (ref 0.7–1.7)
B-GLOBULIN SERPL ELPH-MCNC: 1 g/dL (ref 0.7–1.3)
Gamma Glob SerPl Elph-Mcnc: 1.1 g/dL (ref 0.4–1.8)
Globulin, Total: 3.1 g/dL (ref 2.2–3.9)
IGG (IMMUNOGLOBIN G), SERUM: 990 mg/dL (ref 700–1600)
IgA: 223 mg/dL (ref 61–437)
IgM (Immunoglobulin M), Srm: 30 mg/dL (ref 15–143)
M PROTEIN SERPL ELPH-MCNC: 0.4 g/dL — AB
Total Protein ELP: 6.5 g/dL (ref 6.0–8.5)

## 2018-07-08 NOTE — Progress Notes (Signed)
HEMATOLOGY/ONCOLOGY CONSULTATION NOTE  Date of Service: 07/11/2018  Patient Care Team: [name], MD as PCP - General (Family Medicine) [name], MD as Consulting Physician (Cardiology)  CHIEF COMPLAINTS/PURPOSE OF CONSULTATION:  MGUS  HISTORY OF PRESENTING ILLNESS:   Keith Bernard is a wonderful 72 y.o. male who has been previously followed by my colleague Dr. 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 He is accompanied today by his Keith Bernard. The pt reports that he is doing well overall.   The pt reports that he has had less energy over the last couple years but denies any recent changes in his energy levels. [name] notes that his PCP originally referred him to a nephrologist for observed blood and protein in the urine. This work up ended up in a referral to my colleague Dr. Lebron Conners.   The pt notes that he is often tired, doesn't sleep very well, as he ends up watching TV into the morning and isn't always compliant with his BIPAP. He does not endorse any other concerns that have developed over the last couple months.   The pt notes that he uses Androgel.   Most recent lab results (07/05/18) of CBC w/diff and CMP is as follows: all values are WNL except for RBC at 3.91, HGB at 12.7, HCT at 37.8, and Glucose at 102. MMP 07/05/18 revealed all values WNL except for M Protein at 0.4. Kappa/Lambda 07/05/18 revealed all values WNL except for Kappa at 23.9 LDH 07/05/18 was WNL at 154  On review of systems, pt reports feeling tired, and denies pain along the spine, new bone pains, fevers, chills, night sweats and any other symptoms.    MEDICAL HISTORY:  Past Medical History:  Diagnosis Date  . Asthma   . Chronic kidney disease (CKD), stage III (moderate) (HCC)   . Chronic pain   . 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 Depression 05/04/2016  . DJD (degenerative joint disease)   . Dyslipidemia   . ETOH abuse   . Foot ulcer ([place]) 07/07/2017  . GERD (gastroesophageal reflux disease)   . Hypertension   . Neuropathy   . Obesity   . OSA (obstructive sleep apnea)    Moderate OSA w AHI 17.7/hr now on VPAP at EEP at 7cm H2O, min PS 3cm H2O,max PS 15cm H2O  . RBBB     SURGICAL HISTORY: Past Surgical History:  Procedure Laterality Date  . CORONARY STENT PLACEMENT    . REPLACEMENT TOTAL KNEE     left knee    SOCIAL HISTORY: Social History   Socioeconomic History  . Marital status: Married    Spouse name: Not on file  . Number of children: Not on file  . Years of education: Not on file  . Highest education level: Not on file  Occupational History  . Occupation: Risk manager  Social Needs  . Financial resource strain: Not on file  . Food insecurity:    Worry: Not on file    Inability: Not on file  . Transportation needs:    Medical: Not on file    Non-medical: Not on file  Tobacco Use  . Smoking status: Never Smoker  . Smokeless tobacco: Never Used  Substance and Sexual Activity  . Alcohol use: Yes  Alcohol/week: 0.0 standard drinks    Comment: Moderate, beer  . Drug use: No  . Sexual activity: Not on file  Lifestyle  . Physical activity:    Days per week: Not on file    Minutes per session: Not on file  . Stress: Not on file  Relationships  . Social connections:    Talks on phone: Not on file    Gets together: Not on file    Attends religious service: Not on file    Active member of club or organization: Not on file    Attends meetings of clubs or organizations: Not on file    Relationship status: Not on file  . Intimate partner violence:    Fear of current or ex partner: Not on file    Emotionally abused: Not on file    Physically abused: Not on file    Forced sexual activity: Not on file  Other Topics Concern  . Not on file  Social History Narrative    . Not on file    FAMILY HISTORY: Family History  Problem Relation Age of Onset  . COPD Mother   . Pancreatic cancer Father        pancreatic   . Other Cousin        Amyloidosis    ALLERGIES:  has No Known Allergies.  MEDICATIONS:  Current Outpatient Medications  Medication Sig Dispense Refill  . albuterol (PROAIR HFA) 108 (90 Base) MCG/ACT inhaler Inhale 2 puffs into the lungs every 6 (six) hours as needed for wheezing or shortness of breath. 1 Inhaler 5  . albuterol (PROVENTIL) (2.5 MG/3ML) 0.083% nebulizer solution Take 3 mLs (2.5 mg total) by nebulization every 6 (six) hours as needed for wheezing or shortness of breath. 75 mL 5  . aspirin 81 MG tablet Take 81 mg by mouth daily.      Marland Kitchen atorvastatin (LIPITOR) 20 MG tablet Take 1 tablet (20 mg total) by mouth daily. 90 tablet 3  . escitalopram (LEXAPRO) 20 MG tablet Take 20 mg by mouth daily.    Marland Kitchen esomeprazole (NEXIUM) 40 MG capsule Take 40 mg by mouth daily before breakfast.      . fenofibrate 160 MG tablet TAKE 1 TABLET BY MOUTH  DAILY WITH FOOD 90 tablet 1  . LORazepam (ATIVAN) 1 MG tablet Take 1 mg by mouth every 8 (eight) hours.    . metoprolol tartrate (LOPRESSOR) 25 MG tablet Take 1 tablet (25 mg total) by mouth 2 (two) times daily. 180 tablet 3  . Misc Natural Products (COLON CLEANSE PO) Take by mouth.    . montelukast (SINGULAIR) 10 MG tablet TAKE 1 TABLET BY MOUTH AT  BEDTIME 90 tablet 3  . nitroGLYCERIN (NITROSTAT) 0.4 MG SL tablet Place 1 tablet (0.4 mg total) under the tongue every 5 (five) minutes as needed. 25 tablet 3  . oxyCODONE (ROXICODONE) 15 MG immediate release tablet Take 15 mg by mouth every 4 (four) hours as needed.      Marland Kitchen Spacer/Aero-Holding Chambers (AEROCHAMBER MV) inhaler Use as instructed 1 each 0  . tamsulosin (FLOMAX) 0.4 MG CAPS capsule Take 0.4 mg by mouth daily.     Marland Kitchen zolpidem (AMBIEN) 10 MG tablet Take 10 mg by mouth at bedtime as needed for sleep.      No current facility-administered  medications for this visit.     REVIEW OF SYSTEMS:    10 Point review of Systems was done is negative except as noted above.  PHYSICAL  EXAMINATION:  . Vitals:   07/11/18 1213  BP: 139/71  Pulse: 80  Resp: 19  Temp: 98.5 F (36.9 C)  SpO2: 97%   Filed Weights   07/11/18 1213  Weight: 270 lb 11.2 oz (122.8 kg)   .Body mass index is 38.84 kg/m.  GENERAL:alert, in no acute distress and comfortable SKIN: no acute rashes, no significant lesions EYES: conjunctiva are pink and non-injected, sclera anicteric OROPHARYNX: MMM, no exudates, no oropharyngeal erythema or ulceration NECK: supple, no JVD LYMPH:  no palpable lymphadenopathy in the cervical, axillary or inguinal regions LUNGS: clear to auscultation b/l with normal respiratory effort HEART: regular rate & rhythm ABDOMEN:  normoactive bowel sounds , non tender, not distended. Extremity: no pedal edema PSYCH: alert & oriented x 3 with fluent speech NEURO: no focal motor/sensory deficits  LABORATORY DATA:  I have reviewed the data as listed  . CBC Latest Ref Rng & Units 07/05/2018 03/29/2018 01/03/2018  WBC 4.0 - 10.3 K/uL 4.3 5.9 7.2  Hemoglobin 13.0 - 17.1 g/dL 12.7(L) 13.7 14.1  Hematocrit 38.4 - 49.9 % 37.8(L) 41.8 44.8  Platelets 140 - 400 K/uL 178 209 209    . CMP Latest Ref Rng & Units 07/05/2018 03/29/2018 03/15/2018  Glucose 70 - 99 mg/dL 102(H) 94 -  BUN 8 - 23 mg/dL 18 31(H) -  Creatinine 0.61 - 1.24 mg/dL 0.94 1.07 -  Sodium 135 - 145 mmol/L 140 136 -  Potassium 3.5 - 5.1 mmol/L 4.3 4.6 -  Chloride 98 - 111 mmol/L 101 99 -  CO2 22 - 32 mmol/L 29 29 -  Calcium 8.9 - 10.3 mg/dL 9.0 10.0 -  Total Protein 6.5 - 8.1 g/dL 7.1 7.3 6.7  Total Bilirubin 0.3 - 1.2 mg/dL 0.7 0.9 0.9  Alkaline Phos 38 - 126 U/L 62 75 50  AST 15 - 41 U/L 18 23 19   ALT 0 - 44 U/L 12 12 11       RADIOGRAPHIC STUDIES: I have personally reviewed the radiological images as listed and agreed with the findings in the report. No results  found.  ASSESSMENT & PLAN:   72 y.o. male with   1. IgG Lambda MGUS -Discussed patient's most recent labs from 07/05/18, HGB at 12.7, M Protein at 0.4, Creatinine normal at 0.94, Calcium normal at 9.0 -Reviewed and discussed the patient's prior Bone Survey from 10/28/17 which revealed Negative for lytic or sclerotic lesion. Degenerative change throughout the spine is worst in the cervical and lumbar spine. Atherosclerosis -Discussed that the pt has no hypercalcemia, no abnormal renal functions, mild anemia, and no bone tumors -Discussed the implication of the pt's Monoclonal Gammopathy of Undetermined Significance -No current indication to order BM Bx, will consider this if CRAB criteria or clinical symptoms become indicative of such -The pt shows no clinical or lab progression of his MGUS at this time.  -No indication for initiating active treatment at this time. -Will see the pt back in 6 months    RTC with Dr Irene Limbo with labs in 6 months (labs 1 week prior to clinic visit)   All of the patients questions were answered with apparent satisfaction. The patient knows to call the clinic with any problems, questions or concerns.  The total time spent in the appt was 40 minutes and more than 50% was on counseling and direct patient cares.    Sullivan Lone MD [place] [facility] Hematology/Oncology Physician [facility]  (Office):       [phone] (  Work cell):  [phone] (Fax):           [phone]  07/11/2018 12:55 PM  I, Baldwin Jamaica, am acting as a scribe for Dr. Irene Limbo  .I have reviewed the above documentation for accuracy and completeness, and I agree with the above. Brunetta Genera MD

## 2018-07-11 ENCOUNTER — Encounter: Payer: Self-pay | Admitting: Hematology

## 2018-07-11 ENCOUNTER — Inpatient Hospital Stay (HOSPITAL_BASED_OUTPATIENT_CLINIC_OR_DEPARTMENT_OTHER): Payer: Medicare Other | Admitting: Hematology

## 2018-07-11 ENCOUNTER — Telehealth: Payer: Self-pay | Admitting: Hematology

## 2018-07-11 VITALS — BP 139/71 | HR 80 | Temp 98.5°F | Resp 19 | Ht 70.0 in | Wt 270.7 lb

## 2018-07-11 DIAGNOSIS — D472 Monoclonal gammopathy: Secondary | ICD-10-CM

## 2018-07-11 DIAGNOSIS — E785 Hyperlipidemia, unspecified: Secondary | ICD-10-CM

## 2018-07-11 DIAGNOSIS — R5383 Other fatigue: Secondary | ICD-10-CM

## 2018-07-11 DIAGNOSIS — I4510 Unspecified right bundle-branch block: Secondary | ICD-10-CM

## 2018-07-11 DIAGNOSIS — G4733 Obstructive sleep apnea (adult) (pediatric): Secondary | ICD-10-CM

## 2018-07-11 DIAGNOSIS — Z79899 Other long term (current) drug therapy: Secondary | ICD-10-CM

## 2018-07-11 DIAGNOSIS — G479 Sleep disorder, unspecified: Secondary | ICD-10-CM | POA: Diagnosis not present

## 2018-07-11 DIAGNOSIS — I2510 Atherosclerotic heart disease of native coronary artery without angina pectoris: Secondary | ICD-10-CM

## 2018-07-11 DIAGNOSIS — I129 Hypertensive chronic kidney disease with stage 1 through stage 4 chronic kidney disease, or unspecified chronic kidney disease: Secondary | ICD-10-CM | POA: Diagnosis not present

## 2018-07-11 DIAGNOSIS — I255 Ischemic cardiomyopathy: Secondary | ICD-10-CM

## 2018-07-11 DIAGNOSIS — K219 Gastro-esophageal reflux disease without esophagitis: Secondary | ICD-10-CM

## 2018-07-11 DIAGNOSIS — Z7982 Long term (current) use of aspirin: Secondary | ICD-10-CM

## 2018-07-11 DIAGNOSIS — N183 Chronic kidney disease, stage 3 (moderate): Secondary | ICD-10-CM

## 2018-07-11 DIAGNOSIS — I252 Old myocardial infarction: Secondary | ICD-10-CM

## 2018-07-11 NOTE — Telephone Encounter (Signed)
Appts scheduled AVS/Calendar printed per 8/12 los °

## 2018-07-13 ENCOUNTER — Telehealth: Payer: Self-pay | Admitting: Internal Medicine

## 2018-07-13 MED ORDER — DOXYCYCLINE HYCLATE 100 MG PO TABS: 100.0000 mg | ORAL_TABLET | Freq: Two times a day (BID) | ORAL | 0 refills | Status: DC

## 2018-07-13 MED ORDER — PREDNISONE 10 MG PO TABS: ORAL_TABLET | ORAL | 0 refills | Status: DC

## 2018-07-13 NOTE — Telephone Encounter (Signed)
Spoke with pt, he has been coughing with phlegm (no color) and chest congestion. He is wheezing and he feels like he wont get better without an ABX and Prednisone. He doesn't want to have to go to the hospital. He denies fever but feel really hot at night. He is using the albuterol more often. Please advise MR.   [facility]  Current Outpatient Medications on File Prior to Visit  Medication Sig Dispense Refill  . albuterol (PROAIR HFA) 108 (90 Base) MCG/ACT inhaler Inhale 2 puffs into the lungs every 6 (six) hours as needed for wheezing or shortness of breath. 1 Inhaler 5  . albuterol (PROVENTIL) (2.5 MG/3ML) 0.083% nebulizer solution Take 3 mLs (2.5 mg total) by nebulization every 6 (six) hours as needed for wheezing or shortness of breath. 75 mL 5  . aspirin 81 MG tablet Take 81 mg by mouth daily.      Marland Kitchen atorvastatin (LIPITOR) 20 MG tablet Take 1 tablet (20 mg total) by mouth daily. 90 tablet 3  . escitalopram (LEXAPRO) 20 MG tablet Take 20 mg by mouth daily.    Marland Kitchen esomeprazole (NEXIUM) 40 MG capsule Take 40 mg by mouth daily before breakfast.      . fenofibrate 160 MG tablet TAKE 1 TABLET BY MOUTH  DAILY WITH FOOD 90 tablet 1  . LORazepam (ATIVAN) 1 MG tablet Take 1 mg by mouth every 8 (eight) hours.    . metoprolol tartrate (LOPRESSOR) 25 MG tablet Take 1 tablet (25 mg total) by mouth 2 (two) times daily. 180 tablet 3  . Misc Natural Products (COLON CLEANSE PO) Take by mouth.    . montelukast (SINGULAIR) 10 MG tablet TAKE 1 TABLET BY MOUTH AT  BEDTIME 90 tablet 3  . nitroGLYCERIN (NITROSTAT) 0.4 MG SL tablet Place 1 tablet (0.4 mg total) under the tongue every 5 (five) minutes as needed. 25 tablet 3  . oxyCODONE (ROXICODONE) 15 MG immediate release tablet Take 15 mg by mouth every 4 (four) hours as needed.      Marland Kitchen Spacer/Aero-Holding Chambers (AEROCHAMBER MV) inhaler Use as instructed 1 each 0  . tamsulosin (FLOMAX) 0.4 MG CAPS capsule Take 0.4 mg by mouth daily.     Marland Kitchen zolpidem  (AMBIEN) 10 MG tablet Take 10 mg by mouth at bedtime as needed for sleep.      No current facility-administered medications on file prior to visit.   \ No Known Allergies

## 2018-07-13 NOTE — Telephone Encounter (Signed)
Spoke with [name] he advised me to send in Doxycycline 100 mg one tab bid for 6 days and Prednisone 10 mg 40 x 1, 30x 1, 20x 1, 10x 1 taper. I sent in the medications to [facility]. Pt notified and nothing further is needed.

## 2018-07-26 ENCOUNTER — Other Ambulatory Visit: Payer: Self-pay | Admitting: Cardiology

## 2018-07-26 MED ORDER — METOPROLOL TARTRATE 25 MG PO TABS: 25.0000 mg | ORAL_TABLET | Freq: Two times a day (BID) | ORAL | 2 refills | Status: DC

## 2018-07-28 ENCOUNTER — Telehealth: Payer: Self-pay

## 2018-07-28 NOTE — Telephone Encounter (Signed)
Pt pharmacy [facility])  is requesting a refill on Losartan Tab which is not listed on pt med list. Please address. Thank You.

## 2018-07-29 ENCOUNTER — Other Ambulatory Visit: Payer: Self-pay

## 2018-07-29 MED ORDER — ATORVASTATIN CALCIUM 20 MG PO TABS: 20.0000 mg | ORAL_TABLET | Freq: Every day | ORAL | 2 refills | Status: DC

## 2018-07-29 NOTE — Telephone Encounter (Signed)
LPMTCB 8/30

## 2018-08-01 ENCOUNTER — Telehealth: Payer: Self-pay | Admitting: Adult Health

## 2018-08-01 MED ORDER — PREDNISONE 10 MG PO TABS: ORAL_TABLET | ORAL | 0 refills | Status: DC

## 2018-08-01 MED ORDER — AZITHROMYCIN 250 MG PO TABS: ORAL_TABLET | ORAL | 0 refills | Status: AC

## 2018-08-01 NOTE — Telephone Encounter (Signed)
Phone call : Sick call  Complains of 4 days of cough , congestion with thick yellow mucus , and wheezing with shortness of breath . No hemoptysis , fever or chest pain . Eating good.  Tried mucinex and albuterol with some help.  On Dulera Twice daily  -out of for 3 days . Has symbicort sample at home , advised to use it 2 puffs Twice daily    Plan  zpack x 1  Prednisone taper .  mucinex As needed   Symbicort 2 puffs Twice daily   follow up in office in 4 days as planned and As needed   Please contact office for sooner follow up if symptoms do not improve or worsen or seek emergency care

## 2018-08-02 NOTE — Telephone Encounter (Signed)
lpmtcb 9/3

## 2018-08-02 NOTE — Telephone Encounter (Signed)
Spoke with patient's wife, who verified that the patient is no longer taking Losartan.  She will contact [facility] and inform them.

## 2018-08-05 ENCOUNTER — Ambulatory Visit: Payer: Medicare Other | Admitting: Internal Medicine

## 2018-08-05 ENCOUNTER — Ambulatory Visit (INDEPENDENT_AMBULATORY_CARE_PROVIDER_SITE_OTHER): Payer: Medicare Other | Admitting: Internal Medicine

## 2018-08-05 ENCOUNTER — Encounter: Payer: Self-pay | Admitting: Internal Medicine

## 2018-08-05 VITALS — BP 130/80 | HR 71 | Ht 68.0 in | Wt 268.0 lb

## 2018-08-05 DIAGNOSIS — I2510 Atherosclerotic heart disease of native coronary artery without angina pectoris: Secondary | ICD-10-CM | POA: Diagnosis not present

## 2018-08-05 DIAGNOSIS — J4541 Moderate persistent asthma with (acute) exacerbation: Secondary | ICD-10-CM

## 2018-08-05 LAB — NITRIC OXIDE: NITRIC OXIDE: 28

## 2018-08-05 MED ORDER — PREDNISONE 10 MG PO TABS: ORAL_TABLET | ORAL | 0 refills | Status: DC

## 2018-08-05 MED ORDER — MOMETASONE FURO-FORMOTEROL FUM 100-5 MCG/ACT IN AERO: 2.0000 | INHALATION_SPRAY | Freq: Two times a day (BID) | RESPIRATORY_TRACT | 0 refills | Status: DC

## 2018-08-05 NOTE — Progress Notes (Signed)
OV 01/29/2016  Chief Complaint  Patient presents with  . Follow-up    Pt here after acute visit with SG on 2.10.17. Pt states his breathing has imrpoved since last OV. Pt states he has occassional wheezing and mild non prod cough. Pt denies CP/tightness.     Keith Bernard returns for chronic obstructive lung disease asthma follow-up. He is now on Dulera plus Singulair. He is compliant with Dulera. He says with improved compliance his symptoms are a lot better. His wife is here with him. Most recently he had a flareup and was treated with prednisone. I review the chart when he was seen by [name] gross my nurse practitioner. Currently he feels asthma is well controlled. However he still uses albuterol multiple times a day for rescue but denies any nocturnal symptoms or daytime symptoms. He is compliant with this CPAP for sleep apnea.  Review of the chart shows this is in December 2012 has elevated eos 400 cells per cubic millimeter. I notice that and IgE was never checked. In any event subjectively is well controlled currently. Also noted on alpha-1 has not been checked.  Asthma control questionnaire shows that at night he hardly ever wakes up because of asthma. When he wakes up he has very mild symptoms. In the daytime he is moderately limited because of asthma although this could be because of obesity. He also has moderate shortness of breath because of asthma although this could be because of obesity. He is wheezing a lot of the time and uses albuterol 3-4 puffs on most days. Nevertheless average score of 2.2 show significant improvement after starting [place] 05/04/2016  Chief Complaint  Patient presents with  . Follow-up    Pt c/o occasional wheeze since not being on Breo for about a week. Pt has been using albuterol HFA more. Pt denies cough/SOB/CP/tightness.    Follow-up asthma. Last seen March 2017. This is a routine follow-up. I put him on Breo when she really liked. However the  cost dollars out of pocket because he is in the donut hole. So he ran out of it over a week ago and is having increased symptoms. Asthma control questionnaire is 2.0 and still within his range from prior visits. Exhaled nitric oxide is just over 25 suggests some amount of active airway inflammation. His wife is here with him and she says that she got approved for the [facility] patient assistance program but apparently between our office in the mailroom the paperwork has been loss. She is willing to redo the paperwork and she is wondering if I could sign my part of it this week.  In terms of symptoms specifically tells me that he does wake up a few times at night. When he wakes up he has very mild symptoms. Slightly limited in his activities because of asthma. He has moderate amount of shortness of breath with exertion. He does wheeze a little of the time and is using albuterol for rescue at least one or 2 times daily. 5 point score is 2/.0 showing activity - is worse this past week or so is within normal   OV 01/04/2017   Chief Complaint  Patient presents with  . Follow-up    Pt states last week he had an 'asthma attack' and went to ED in [place] and was given steroid and abx per pt's wife. Pt states he feels he is improving. Pt states he does not feel back to baseline. Pt denies CP/tightness  and f/c/s.     Follow-up moderate persistent asthma  Last seen June 2017. He presents with his wife as always. He is on Dulera through the assistance program. Some 3 weeks ago his sister died from terminal cancer. Take a flight to [place]. His been dealing with those emotions. Because of the travel, emotions and weather change some 10 days ago he had an asthma exacerbation but never below few days with increased cough and wheezing. He went to a local emergency department and got one shot of steroids and was given 7 days of cephalexin which is completed. Despite this is not fully better. He still has some cough  and wheeze and yellow sputum. There is no fever.   OV 07/07/2017  Chief Complaint  Patient presents with  . Follow-up    Pt using Dulera, still having trouble getting this covered by insurance and getting Financial Assistance/Patient Assistance. Pt states that his breathing is doing better after senc round of Abx/Prednisone.      Follow-up moderate persistent asthma   Last seen February 2018. He presents with his wife. Overall he is doing stable except with the summer heat his symptoms are slightly more than usual as seen in the asthma control questionnaire. He says that he does not wake up at night because of asthma. When he wakes up he has mild symptoms. He has limitation in his activities because of asthma but this could also be because of obesity and DJD and spine issues. He does get dyspneic for moderate amount of the time and is wheezing a lot of the time but using albuterol for rescue 1-2 puffs per day. He's having significant difficulty getting [employer] to pay for his assistance program and is in need of samples today. He continues on Singulair.   OV 12/09/2017  Chief Complaint  Patient presents with  . Follow-up    shortness of breath   Follow-up moderate persistent asthma  Routine 73-month follow-up.  He presents with his wife.  Overall he is doing fine.  He has significant symptoms on his asthma control questionnaire.  In fact his symptoms are slowly deteriorated.  Average score is 2.8.  But this resolved mostly shortness of breath.  He feels is due to obesity.  His nitric oxide is 12 showing good asthma control on Dulera and Singulair.  He plans to lose weight.  Recently had a fall and hurt the shoulder.  Review of the images show is not had a chest x-ray since 2010.  He is up-to-date with his flu shot.  In terms of his asthma control questionnaire he hardly ever wakes up at night.  When he wakes up he has moderate symptoms.  He is limited with his activities moderately and does  express a moderate amount of shortness of breath but he does wheeze occasionally.  He is using albuterol for rescue multiple times a day.  =  OV 08/05/2018  Subjective:  Patient ID: Keith Bernard, male , DOB: [birth year] , age 24 y.o. , MRN: [id number] , ADDRESS: [place] [id number]   08/05/2018 -   Chief Complaint  Patient presents with  . Follow-up    Pt has been sick and was put on prednisone and abx which he began Monday, 9/2.  Pt has c/o cough with green mucus, increased SOB. Denies any CP or chest tightness.     HPI Keith Bernard 72 y.o. -moderate persistent asthma patient here for follow-up. He is here with  his wife. They tell me that has been many months since he has taken his inhale corticosteroid Dulera because the patient assistance program has been slow to respond.He is relying on sampples. Apparently got some Symbicort from usat some point in the past since he last saw me. However he has run out of that as well. He is not sure how long it is since he ran out of that. So he is having a flareup. He is just dependent on prednisone for the last 5 days with antibiotic symptoms by our nurse practitioner.He does not feel fully back to baseline. His asthma control question is 3.2 showing significant nocturnal symptoms or albuterol rescue use OF breath and wheezing. In fact on exam he had some wheezing is exhaled nitric oxide was 28 while on prednisone     Asthma Control Panel -December 2012eos 400 cells per cubic millimeter \ - IgE not checked as of 12/09/2017  - obstn with low dlco nov 2016 on PFT    - Alpha 1 is MM 08/23/2015  01/29/2016  05/04/2016  07/07/2017  12/09/2017  08/05/2018   Current Med Regimen symbicort  Dulera  Off breo for several days.  On dulera 100. Struggling to afford and singulair dulera and singulair dulera and singulair  ACQ 5 point- 1 week. wtd avg score. <1.0 is good control 0.75-1.25 is grey zone. >1.25 poor control. Delta 0.5 is  clinically meaningful 3.2 2.2 2.0 2.6 2.8 3.2  FeNO ppB 34 x 26  13 28  on prednisone  FeV1  x  x     Planned intervention  for visit Start dulera + singulair Cont dulera        ROS - per HPI     has a past medical history of Asthma, Chronic kidney disease (CKD), stage III (moderate) ([place]), Chronic pain, Coronary artery disease, Depression (05/04/2016), DJD (degenerative joint disease), Dyslipidemia, ETOH abuse, Foot ulcer ([place]) (07/07/2017), GERD (gastroesophageal reflux disease), Hypertension, Neuropathy, Obesity, OSA (obstructive sleep apnea), and RBBB.   reports that he has never smoked. He has never used smokeless tobacco.  Past Surgical History:  Procedure Laterality Date  . CORONARY STENT PLACEMENT    . REPLACEMENT TOTAL KNEE     left knee    No Known Allergies  Immunization History  Administered Date(s) Administered  . DTaP 07/22/2001  . Influenza Split 09/30/2014  . Influenza, High Dose Seasonal PF 11/04/2016  . Influenza,inj,Quad PF,6+ Mos 10/23/2015  . Influenza-Unspecified 08/30/2017  . Pneumococcal Conjugate-13 01/02/2015, 10/01/2015  . Pneumococcal Polysaccharide-23 04/11/2012  . Tdap 12/22/2016  . Zoster 01/02/2015    Family History  Problem Relation Age of Onset  . COPD Mother   . Pancreatic cancer Father        pancreatic   . Other Cousin        Amyloidosis     Current Outpatient Medications:  .  albuterol (PROAIR HFA) 108 (90 Base) MCG/ACT inhaler, Inhale 2 puffs into the lungs every 6 (six) hours as needed for wheezing or shortness of breath., Disp: 1 Inhaler, Rfl: 5 .  albuterol (PROVENTIL) (2.5 MG/3ML) 0.083% nebulizer solution, Take 3 mLs (2.5 mg total) by nebulization every 6 (six) hours as needed for wheezing or shortness of breath., Disp: 75 mL, Rfl: 5 .  aspirin 81 MG tablet, Take 81 mg by mouth daily.  , Disp: , Rfl:  .  atorvastatin (LIPITOR) 20 MG tablet, Take 20 mg by mouth daily., Disp: , Rfl:  .  azithromycin (ZITHROMAX Z-PAK) 250 MG  tablet, Take 2 tablets (500 mg) on  Day 1,  followed by 1 tablet (250 mg) once daily on Days 2 through 5., Disp: 6 each, Rfl: 0 .  CALCIUM PO, Take by mouth., Disp: , Rfl:  .  Cholecalciferol (VITAMIN D3) 1000 units CAPS, Take 1,000 Units by mouth., Disp: , Rfl:  .  CO ENZYME Q-10 PO, Take by mouth., Disp: , Rfl:  .  escitalopram (LEXAPRO) 20 MG tablet, Take 20 mg by mouth daily., Disp: , Rfl:  .  esomeprazole (NEXIUM) 40 MG capsule, Take 40 mg by mouth daily before breakfast.  , Disp: , Rfl:  .  fenofibrate 160 MG tablet, TAKE 1 TABLET BY MOUTH  DAILY WITH FOOD, Disp: 90 tablet, Rfl: 2 .  LORazepam (ATIVAN) 1 MG tablet, Take 1 mg by mouth every 8 (eight) hours., Disp: , Rfl:  .  MAGNESIUM PO, Take by mouth., Disp: , Rfl:  .  metoprolol tartrate (LOPRESSOR) 25 MG tablet, Take 1 tablet (25 mg total) by mouth 2 (two) times daily., Disp: 180 tablet, Rfl: 2 .  Misc Natural Products (COLON CLEANSE PO), Take by mouth., Disp: , Rfl:  .  montelukast (SINGULAIR) 10 MG tablet, TAKE 1 TABLET BY MOUTH AT  BEDTIME, Disp: 90 tablet, Rfl: 3 .  oxyCODONE (ROXICODONE) 15 MG immediate release tablet, Take 15 mg by mouth every 4 (four) hours as needed.  , Disp: , Rfl:  .  Spacer/Aero-Holding Chambers (AEROCHAMBER MV) inhaler, Use as instructed, Disp: 1 each, Rfl: 0 .  tamsulosin (FLOMAX) 0.4 MG CAPS capsule, Take 0.4 mg by mouth daily. , Disp: , Rfl:  .  vitamin B-12 (CYANOCOBALAMIN) 1000 MCG tablet, Take 1,000 mcg by mouth daily., Disp: , Rfl:  .  zolpidem (AMBIEN) 10 MG tablet, Take 10 mg by mouth at bedtime as needed for sleep. , Disp: , Rfl:  .  mometasone-formoterol (DULERA) 100-5 MCG/ACT AERO, Inhale 2 puffs into the lungs 2 (two) times daily., Disp: 1 Inhaler, Rfl: 0 .  nitroGLYCERIN (NITROSTAT) 0.4 MG SL tablet, Place 1 tablet (0.4 mg total) under the tongue every 5 (five) minutes as needed. (Patient not taking: Reported on 08/05/2018), Disp: 25 tablet, Rfl: 3      Objective:   Vitals:   08/05/18 1101    BP: 130/80  Pulse: 71  SpO2: 95%  Weight: 268 lb (121.6 kg)  Height: 5\' 8"  (1.727 m)    Estimated body mass index is 40.75 kg/m as calculated from the following:   Height as of this encounter: 5\' 8"  (1.727 m).   Weight as of this encounter: 268 lb (121.6 kg).  @WEIGHTCHANGE @  [employer]   08/05/18 1101  Weight: 268 lb (121.6 kg)     Physical Exam  General Appearance:    Alert, cooperative, no distress, appears stated age - yes , sitting on - chair  Head:    Normocephalic, without obvious abnormality, atraumatic  Eyes:    PERRL, conjunctiva/corneas clear,  Ears:    Normal TM's and external ear canals, both ears  Nose:   Nares normal, septum midline, mucosa normal, no drainage    or sinus tenderness. OXYGEN ON  - no . Patient is @ no   Throat:   Lips, mucosa, and tongue normal; teeth and gums normal. Cyanosis on lips - no  Neck:   Supple, symmetrical, trachea midline, no adenopathy;    thyroid:  no enlargement/tenderness/nodules; no carotid   bruit or JVD  Back:     Symmetric, no  curvature, ROM normal, no CVA tenderness  Lungs:     Distress - no , Wheeze yes occ, Barrell Chest - no, Purse lip breathing - no, Crackles - no   Chest Wall:    No tenderness or deformity. Scars in chest no   Heart:    Regular rate and rhythm, S1 and S2 normal, no murmur, rub   or gallop  Breast Exam:    NOT DONE  Abdomen:     Soft, non-tender, bowel sounds active all four quadrants,    no masses, no organomegaly  Genitalia:   NOT DONE  Rectal:   NOT DONE  Extremities:   Extremities normal, atraumatic, Clubbing - no, Edema - no  Pulses:   2+ and symmetric all extremities  Skin:   Stigmata of Connective Tissue Disease - no  Lymph nodes:   Cervical, supraclavicular, and axillary nodes normal  Psychiatric:  Neurologic:   pleasant CNII-XII intact, normal strength, sensation  throughout           Assessment:       ICD-10-CM   1. Moderate persistent asthma with acute exacerbation  J45.41        Plan:      In flare up due to running out of dulera  Plan Take dulera samples for 1 month  Take it it twice daily Please get dulera through assistance program asap Extend prednisone taper as follows till dulera kicks in  - Take prednisone 40 mg daily x 2 days, then 20mg  daily x 2 days, then 10mg  daily x 2 days, then 5mg  daily x 2 days and stop Use albuterol as needed Flu shot withPCP Boals, Marjory Lies, MD after flare up resoltuon   Followup 8 weeks with APP 16 weeks with Dr Gaye Alken    Dr. Brand Males, M.D., F.C.C.P,  Pulmonary and Critical Care Medicine Staff Physician, [place] Director - Interstitial Lung Disease  Program  [facility] [place], [place], [address]  Pager: [phone], If no answer or between  15:00h - 7:00h: call [phone] Telephone: [phone]  11:41 AM 08/05/2018

## 2018-08-05 NOTE — Patient Instructions (Addendum)
ICD-10-CM   1. Moderate persistent asthma with acute exacerbation J45.41     In flare up due to running out of dulera  Plan Take dulera samples for 1 month  Take it it twice daily Please get dulera through assistance program asap Extend prednisone taper as follows till dulera kicks in  - Take prednisone 40 mg daily x 2 days, then 20mg  daily x 2 days, then 10mg  daily x 2 days, then 5mg  daily x 2 days and stop Use albuterol as needed Flu shot withPCP Boals, Marjory Lies, MD after flare up resoltuon   Followup 8 weeks with APP 16 weeks with Dr Chase Caller

## 2018-09-06 ENCOUNTER — Telehealth: Payer: Self-pay | Admitting: Internal Medicine

## 2018-09-06 MED ORDER — PREDNISONE 10 MG PO TABS: ORAL_TABLET | ORAL | 0 refills | Status: DC

## 2018-09-06 MED ORDER — DOXYCYCLINE HYCLATE 100 MG PO TABS: 100.0000 mg | ORAL_TABLET | Freq: Two times a day (BID) | ORAL | 0 refills | Status: DC

## 2018-09-06 NOTE — Telephone Encounter (Signed)
   Probably having an obstructive lung disease exacerbation I recommend   Take doxycycline 100mg  po twice daily x 5 days; take after meals and avoid sunlight  Please take prednisone 40 mg x1 day, then 30 mg x1 day, then 20 mg x1 day, then 10 mg x1 day, and then 5 mg x1 day and stop

## 2018-09-06 NOTE — Telephone Encounter (Signed)
Called and spoke with pt who stated he started having worsening breathing x2 days now, coughing up mucus that is green in color, and also has had some wheezing. Pt states his throat is also sore.  Pt denies any fever but states he was having night sweats last night.  At this time pt declines appt and wants something to be prescribed to help with his symptoms.  [name], please advise. Thanks!

## 2018-09-06 NOTE — Telephone Encounter (Signed)
Patient states can reach him on cell at [phone].

## 2018-09-06 NOTE — Telephone Encounter (Signed)
Per MR-  Probably having an obstructive lung disease exacerbation I recommend   Take doxycycline 100mg  po twice daily x 5 days; take after meals and avoid sunlight  Please take prednisone 40 mg x1 day, then 30 mg x1 day, then 20 mg x1 day, then 10 mg x1 day, and then 5 mg x1 day and stop  Patient notified of recommendations.  Understanding stated. Prescriptions sent to preferred pharmacy, [facility] in [place].  Nothing further at this time.

## 2018-09-07 ENCOUNTER — Telehealth: Payer: Self-pay | Admitting: Cardiology

## 2018-09-07 NOTE — Telephone Encounter (Signed)
New Message:    Pt c/o swelling: STAT is pt has developed SOB within 24 hours  1) How much weight have you gained and in what time span? Yes   If swelling, where is the swelling located? Both Legs and Feet   2) Are you currently taking a fluid pill? No  3) Are you currently SOB? Yes, asthma   4) Do you have a log of your daily weights (if so, list)?  No   5) Have you gained 3 pounds in a day or 5 pounds in a week? Yes   6) Have you traveled recently? NO     Pt c/o Shortness Of Breath: STAT if SOB developed within the last 24 hours or pt is noticeably SOB on the phone  1. Are you currently SOB (can you hear that pt is SOB on the phone)? NO   2. How long have you been experiencing SOB? Asthma   3. Are you SOB when sitting or when up moving around? Moving Around   4. Are you currently experiencing any other symptoms? Legs and Feet Swelling

## 2018-09-08 NOTE — Telephone Encounter (Signed)
The patient has been having swelling in his legs and feet for the past 3 days. The swelling occurred overnight 3 days ago. The patient has neuropathy and was concerned he broke his foot or had a clot, went to a urgent care and had x-rays and a ultrasound and ruled out a break or DVT. The only recent medication change was adding androgel. He is seeing Pulmonology for his SOB due to asthma and recently started prednisone and Z-pak around the beginning of September. He has not had swelling like this in awhile. He remembers being on a diuretic, but he is not taking anymore.   Patient also had questions about getting a new nose piece for his BiPAP, since his is broken, he has not been sleeping well since.   Sending to Dr. Radford Pax for recommendations.

## 2018-09-08 NOTE — Telephone Encounter (Signed)
He needs to see PCP or lung MD tomorrow.  In regards to mask for PAP - he needs to call his DME

## 2018-09-09 NOTE — Telephone Encounter (Signed)
Pt advised Dr. Theodosia Blender recommendations and he is seeing his Pulmonary MD on Monday 09/12/18

## 2018-09-09 NOTE — Telephone Encounter (Signed)
  Patient is returning call from yesterday

## 2018-09-12 NOTE — Telephone Encounter (Signed)
Patient has been advised to contact his dme in regards to his mask and Pt is aware and agreeable to normal results.

## 2018-09-16 ENCOUNTER — Telehealth: Payer: Self-pay | Admitting: Cardiology

## 2018-09-16 NOTE — Telephone Encounter (Signed)
Patient/wife per dpr states patient's supplies are being delivered.

## 2018-09-16 NOTE — Telephone Encounter (Signed)
  Patient's wife is checking to see if an order can be sent to [facility] to get him a new Bipap mask

## 2018-09-21 ENCOUNTER — Other Ambulatory Visit (INDEPENDENT_AMBULATORY_CARE_PROVIDER_SITE_OTHER): Payer: Medicare Other

## 2018-09-21 ENCOUNTER — Encounter: Payer: Self-pay | Admitting: Primary Care

## 2018-09-21 ENCOUNTER — Other Ambulatory Visit: Payer: Self-pay | Admitting: Primary Care

## 2018-09-21 ENCOUNTER — Ambulatory Visit (INDEPENDENT_AMBULATORY_CARE_PROVIDER_SITE_OTHER): Payer: Medicare Other | Admitting: Primary Care

## 2018-09-21 VITALS — BP 124/72 | HR 67 | Temp 98.1°F | Ht 68.0 in | Wt 286.6 lb

## 2018-09-21 DIAGNOSIS — I420 Dilated cardiomyopathy: Secondary | ICD-10-CM | POA: Diagnosis not present

## 2018-09-21 DIAGNOSIS — G4733 Obstructive sleep apnea (adult) (pediatric): Secondary | ICD-10-CM

## 2018-09-21 LAB — BASIC METABOLIC PANEL
BUN: 31 mg/dL — ABNORMAL HIGH (ref 6–23)
CALCIUM: 9.2 mg/dL (ref 8.4–10.5)
CHLORIDE: 99 meq/L (ref 96–112)
CO2: 30 meq/L (ref 19–32)
Creatinine, Ser: 1.26 mg/dL (ref 0.40–1.50)
GFR: 59.62 mL/min — ABNORMAL LOW (ref >60.00)
GLUCOSE: 116 mg/dL — AB (ref 70–99)
POTASSIUM: 4.6 meq/L (ref 3.5–5.1)
Sodium: 136 mEq/L (ref 135–145)

## 2018-09-21 LAB — BRAIN NATRIURETIC PEPTIDE: Pro B Natriuretic peptide (BNP): 118 pg/mL — ABNORMAL HIGH (ref 0.0–100.0)

## 2018-09-21 MED ORDER — FUROSEMIDE 20 MG PO TABS: 20.0000 mg | ORAL_TABLET | Freq: Every day | ORAL | 0 refills | Status: DC

## 2018-09-21 NOTE — Progress Notes (Signed)
@Patient  ID: [name], male    DOB: 1946/05/12, 72 y.o.   MRN: [id number]  Chief Complaint  Patient presents with  . Acute Visit    swelling of legs and feet maily right for 3-4 weeks, SOB, cough with yellowish    Referring provider: [name], MD  HPI: 72 year old male, never smoked.  Past medical history significant for obstructive asthma and objective sleep apnea, dilated cardiomyopathy, CAD, HTN.  Patient with [name], last seen on 08/05/18.  Maintained on Dulera and Singulair.  Called office with complains of shortness of breath, wheezing and cough with green-colored mucus on 09/06/18.  He was given a course of doxycycline for 5 days and prednisone taper.  09/22/2018 Patient presents today for an acute visit with complaints of swelling in his feet and legs, R>L for 3 to 4 weeks.  He also has complaints of shortness of breath, cough and wheezing. States that Abx and prednisone course helped a great deal. Symptoms returned 3-4 days ago. Having problems getting bipap supplies from his DME company d/t an audit, trying to make it work with a spare mask but states that it's leaking. States that he has been swelling at night and put on 15 lbs.    No Known Allergies  Immunization History  Administered Date(s) Administered  . DTaP 07/22/2001  . Influenza Split 09/30/2014  . Influenza, High Dose Seasonal PF 11/04/2016, 08/17/2017  . Influenza,inj,Quad PF,6+ Mos 10/23/2015  . Influenza-Unspecified 08/30/2017  . Pneumococcal Conjugate-13 01/02/2015, 10/01/2015  . Pneumococcal Polysaccharide-23 04/11/2012  . Tdap 12/22/2016  . Zoster 01/02/2015  . Zoster Recombinat (Shingrix) 01/20/2018    Past Medical History:  Diagnosis Date  . Asthma   . Chronic kidney disease (CKD), stage III (moderate) (HCC)   . Chronic pain   . 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 Depression 05/04/2016  . DJD (degenerative joint disease)   . Dyslipidemia   . ETOH abuse   . Foot ulcer ([place]) 07/07/2017  . GERD (gastroesophageal reflux disease)   . Hypertension   . Neuropathy   . Obesity   . OSA (obstructive sleep apnea)    Moderate OSA w AHI 17.7/hr now on VPAP at EEP at 7cm H2O, min PS 3cm H2O,max PS 15cm H2O  . RBBB     Tobacco History: Social History   Tobacco Use  Smoking Status Never Smoker  Smokeless Tobacco Never Used   Counseling given: Not Answered   Outpatient Medications Prior to Visit  Medication Sig Dispense Refill  . albuterol (PROAIR HFA) 108 (90 Base) MCG/ACT inhaler Inhale 2 puffs into the lungs every 6 (six) hours as needed for wheezing or shortness of breath. 1 Inhaler 5  . albuterol (PROVENTIL) (2.5 MG/3ML) 0.083% nebulizer solution Take 3 mLs (2.5 mg total) by nebulization every 6 (six) hours as needed for wheezing or shortness of breath. 75 mL 5  . aspirin 81 MG tablet Take 81 mg by mouth daily.      Marland Kitchen atorvastatin (LIPITOR) 20 MG tablet Take 20 mg by mouth daily.    Marland Kitchen CALCIUM PO Take by mouth.    . Cholecalciferol (VITAMIN D3) 1000 units CAPS Take 1,000 Units by mouth.    . CO ENZYME Q-10 PO Take by mouth.    . escitalopram (LEXAPRO)  20 MG tablet Take 20 mg by mouth daily.    Marland Kitchen esomeprazole (NEXIUM) 40 MG capsule Take 40 mg by mouth daily before breakfast.      . fenofibrate 160 MG tablet TAKE 1 TABLET BY MOUTH  DAILY WITH FOOD 90 tablet 2  . LORazepam (ATIVAN) 1 MG tablet Take 1 mg by mouth every 8 (eight) hours.    Marland Kitchen MAGNESIUM PO Take by mouth.    . metoprolol tartrate (LOPRESSOR) 25 MG tablet Take 1 tablet (25 mg total) by mouth 2 (two) times daily. 180 tablet 2  . Misc Natural Products (COLON CLEANSE PO) Take by mouth.    . mometasone-formoterol (DULERA) 100-5 MCG/ACT AERO Inhale 2 puffs into the lungs 2 (two) times daily. 1 Inhaler 0  . montelukast (SINGULAIR) 10 MG tablet TAKE 1  TABLET BY MOUTH AT  BEDTIME 90 tablet 3  . nitroGLYCERIN (NITROSTAT) 0.4 MG SL tablet Place 1 tablet (0.4 mg total) under the tongue every 5 (five) minutes as needed. 25 tablet 3  . oxyCODONE (ROXICODONE) 15 MG immediate release tablet Take 15 mg by mouth every 4 (four) hours as needed.      Marland Kitchen Spacer/Aero-Holding Chambers (AEROCHAMBER MV) inhaler Use as instructed 1 each 0  . tamsulosin (FLOMAX) 0.4 MG CAPS capsule Take 0.4 mg by mouth daily.     . vitamin B-12 (CYANOCOBALAMIN) 1000 MCG tablet Take 1,000 mcg by mouth daily.    Marland Kitchen zolpidem (AMBIEN) 10 MG tablet Take 10 mg by mouth at bedtime as needed for sleep.     . predniSONE (DELTASONE) 10 MG tablet Take 40mg x2days,20mg x2days,10mg x2days,5mg x2days,then stop 15 tablet 0  . predniSONE (DELTASONE) 10 MG tablet 40mg x1 day,30mg x1day,20mg x1day,10mg x1day,5mg  x 1 day 11 tablet 0  . doxycycline (VIBRA-TABS) 100 MG tablet Take 1 tablet (100 mg total) by mouth 2 (two) times daily. 10 tablet 0   No facility-administered medications prior to visit.     Review of Systems  Review of Systems  Constitutional: Positive for unexpected weight change.  HENT: Negative.   Respiratory: Positive for cough and shortness of breath. Negative for choking and chest tightness.   Cardiovascular: Positive for leg swelling. Negative for chest pain and palpitations.    Physical Exam  BP 124/72 (BP Location: Right Arm, Cuff Size: Normal)   Pulse 67   Temp 98.1 F (36.7 C)   Ht 5\' 8"  (1.727 m)   Wt 286 lb 9.6 oz (130 kg)   SpO2 98%   BMI 43.58 kg/m  Physical Exam  Constitutional: He is oriented to person, place, and time. He appears well-developed and well-nourished. No distress.  HENT:  Head: Normocephalic and atraumatic.  Eyes: Pupils are equal, round, and reactive to light. EOM are normal.  Neck: Normal range of motion. Neck supple.  Cardiovascular: Normal rate and regular rhythm.  +2-3 BLE edema, R>L  Pulmonary/Chest: Effort normal and breath sounds  normal.  CTA  Musculoskeletal: Normal range of motion.  Neurological: He is alert and oriented to person, place, and time.  Skin: Skin is warm and dry.  Psychiatric: He has a normal mood and affect. His behavior is normal. Judgment and thought content normal.     Lab Results:  CBC    Component Value Date/Time   WBC 4.3 07/05/2018 1135   WBC 8.0 10/28/2017 1602   WBC 5.7 11/06/2011 0317   RBC 3.91 (L) 07/05/2018 1135   HGB 12.7 (L) 07/05/2018 1135   HGB 14.1 10/28/2017 1602   HCT 37.8 (L) 07/05/2018 1135  HCT 43.8 10/28/2017 1602   PLT 178 07/05/2018 1135   PLT 201 10/28/2017 1602   MCV 96.8 07/05/2018 1135   MCV 102.6 (H) 10/28/2017 1602   MCH 32.4 07/05/2018 1135   MCHC 33.5 07/05/2018 1135   RDW 13.0 07/05/2018 1135   RDW 13.5 10/28/2017 1602   LYMPHSABS 1.2 07/05/2018 1135   LYMPHSABS 1.4 10/28/2017 1602   MONOABS 0.4 07/05/2018 1135   MONOABS 1.0 (H) 10/28/2017 1602   EOSABS 0.3 07/05/2018 1135   EOSABS 0.1 10/28/2017 1602   BASOSABS 0.0 07/05/2018 1135   BASOSABS 0.0 10/28/2017 1602    BMET    Component Value Date/Time   NA 136 09/21/2018 1533   NA 139 10/28/2017 1602   K 4.6 09/21/2018 1533   K 4.3 10/28/2017 1602   CL 99 09/21/2018 1533   CO2 30 09/21/2018 1533   CO2 26 10/28/2017 1602   GLUCOSE 116 (H) 09/21/2018 1533   GLUCOSE 99 10/28/2017 1602   BUN 31 (H) 09/21/2018 1533   BUN 16.3 10/28/2017 1602   CREATININE 1.26 09/21/2018 1533   CREATININE 0.94 07/05/2018 1135   CREATININE 1.0 10/28/2017 1602   CALCIUM 9.2 09/21/2018 1533   CALCIUM 9.3 10/28/2017 1602   GFRNONAA >60 07/05/2018 1135   GFRAA >60 07/05/2018 1135    BNP No results found for: BNP  ProBNP    Component Value Date/Time   PROBNP 118.0 (H) 09/21/2018 1533    Imaging: No results found.   Assessment & Plan:  72 year old male, never smoked. Hx obstructive asthma, OSA, HTN, dilated cardiomyopathy, ASCAD. Presents today with shortness of breath, cough and leg swelling.  States that he has been having difficulties with his BIPAP and as a result "swelled up" at night. Reports 15lb weight gain. BNP elevated on today's labs. Plan diurese with oral lasix and follow up.   DCM (dilated cardiomyopathy) ([place]) Echo in 2010 showed EF 50-55% Acute leg swelling, weight gain and sob BNP 09/22/2018 - 118 Plan diuresis with oral Lasix 20mg  daily x 5 day FU in 5 days with repeat labs prior  Return if symptoms do not improve or worsen in any way   Obstructive sleep apnea Given sample of new nasal mask today for patient to try with BIPAP machine FU in 1 week    Martyn Ehrich, NP 09/22/2018

## 2018-09-21 NOTE — Patient Instructions (Addendum)
Orders: Labs today  Rx: Lasix 20mg  daily x 5 days   Recommendations: Low salt diet  Use compression stockings for leg swelling   Follow-up:  5 days with Eustaquio Maize or NP  Needs new DME company for CPAP supplies   Return sooner or present to ED if shortness of breath worsens

## 2018-09-22 ENCOUNTER — Encounter: Payer: Self-pay | Admitting: Primary Care

## 2018-09-22 ENCOUNTER — Telehealth: Payer: Self-pay | Admitting: Primary Care

## 2018-09-22 NOTE — Telephone Encounter (Signed)
Beth, please advise if it is okay to refill med for pt or if this needs to wait until pt's follow up OV with you 10/29.  Thanks!

## 2018-09-22 NOTE — Assessment & Plan Note (Signed)
Echo in 2010 showed EF 50-55% Acute leg swelling, weight gain and sob BNP 09/22/2018 - 118 Plan diuresis with oral Lasix 20mg  daily x 5 day FU in 5 days with repeat labs prior  Return if symptoms do not improve or worsen in any way

## 2018-09-22 NOTE — Telephone Encounter (Signed)
Spoke with the pt  He states would like to stick with [facility] He does not need any new supplies at this time  Will close encounter

## 2018-09-22 NOTE — Telephone Encounter (Signed)
Well it just needs an order put in to change the pt from [facility] to another dme we just sent and order to [facility] today Keith Bernard

## 2018-09-22 NOTE — Telephone Encounter (Signed)
Not intended to keep patient on, this is a one time order for now. No to 90 day supple

## 2018-09-22 NOTE — Assessment & Plan Note (Signed)
Given sample of new nasal mask today for patient to try with BIPAP machine FU in 1 week

## 2018-09-22 NOTE — Telephone Encounter (Signed)
Libby  Please advise patient was needing to switch from [facility] to a new DME. Thank you.

## 2018-09-27 ENCOUNTER — Ambulatory Visit (INDEPENDENT_AMBULATORY_CARE_PROVIDER_SITE_OTHER)
Admission: RE | Admit: 2018-09-27 | Discharge: 2018-09-27 | Disposition: A | Discharge status: Home / Self Care | Payer: Medicare Other | Source: Ambulatory Visit | Attending: Primary Care | Admitting: Primary Care

## 2018-09-27 ENCOUNTER — Other Ambulatory Visit (INDEPENDENT_AMBULATORY_CARE_PROVIDER_SITE_OTHER): Payer: Medicare Other

## 2018-09-27 ENCOUNTER — Encounter: Payer: Self-pay | Admitting: Primary Care

## 2018-09-27 ENCOUNTER — Ambulatory Visit (INDEPENDENT_AMBULATORY_CARE_PROVIDER_SITE_OTHER): Payer: Medicare Other | Admitting: Primary Care

## 2018-09-27 VITALS — BP 124/78 | HR 58 | Temp 98.3°F | Ht 68.0 in | Wt 276.2 lb

## 2018-09-27 DIAGNOSIS — J4541 Moderate persistent asthma with (acute) exacerbation: Secondary | ICD-10-CM

## 2018-09-27 DIAGNOSIS — I420 Dilated cardiomyopathy: Secondary | ICD-10-CM

## 2018-09-27 LAB — BRAIN NATRIURETIC PEPTIDE: PRO B NATRI PEPTIDE: 232 pg/mL — AB (ref 0.0–100.0)

## 2018-09-27 LAB — NITRIC OXIDE: FENO LEVEL (PPB): 29

## 2018-09-27 LAB — BASIC METABOLIC PANEL
BUN: 11 mg/dL (ref 6–23)
CHLORIDE: 100 meq/L (ref 96–112)
CO2: 30 mEq/L (ref 19–32)
CREATININE: 0.88 mg/dL (ref 0.40–1.50)
Calcium: 9.5 mg/dL (ref 8.4–10.5)
GFR: 90.22 mL/min (ref >60.00)
Glucose, Bld: 102 mg/dL — ABNORMAL HIGH (ref 70–99)
Potassium: 4 mEq/L (ref 3.5–5.1)
Sodium: 137 mEq/L (ref 135–145)

## 2018-09-27 MED ORDER — PREDNISONE 10 MG PO TABS: ORAL_TABLET | ORAL | 0 refills | Status: DC

## 2018-09-27 NOTE — Patient Instructions (Addendum)
Lasix 20mg  every other day until you are seen by cardiology   Obstructive asthma: - Continue Dulera two puffs twice daily - Albuterol rescue inhaler 2 puffs every 6 hours for sob/wheezing (refill sent) - If rescue inhaler is not effective, then try albuterol nebulizer every 6 hours for sob/wheezing   RX:  Prednisone taper as prescribed   Orders: CXR today

## 2018-09-27 NOTE — Progress Notes (Signed)
@Patient  ID: Keith Bernard, male    DOB: [birth year], 72 y.o.   MRN: [id number]  Chief Complaint  Patient presents with  . Follow-up    swelling lower extremities right more than left, tired    Referring provider: [name], MD  HPI: 72 year old male, never smoked.  Past medical history significant for obstructive asthma and objective sleep apnea, dilated cardiomyopathy, CAD, HTN.  Patient with [name], last seen on 08/05/18.  Maintained on Dulera and Singulair.  09/22/2018 Presents today with shortness of breath, cough and leg swelling. States that he has been having difficulties with his BIPAP and as a result "swelled up" at night. Reports 15lb weight gain. BNP elevated on today's labs. Plan diurese with lasix and follow up.  Given sample of new nasal mask for bipap.   10/02/2018  Patient presents today for 1 week follow-up. Needs repeat labs today. Took lasix 20mg  x 5 days. States that he feels a lot better, weight and leg swelling is significantly down 276 lbs (from 286 lbs). Still having trouble getting a full breath. Associated fatigue and wheezing. Continues Dulera twice daily. Uses rescue inhaler 2-3 times a day with improvement helps. He has not received BiPAP mask from [facility] (wife thinks mask type is called P10). Using sample nasal mask given during last weeks office visit, feels it's better but still has issues.   No Known Allergies  Immunization History  Administered Date(s) Administered  . DTaP 07/22/2001  . Influenza Split 09/30/2014  . Influenza, High Dose Seasonal PF 11/04/2016, 08/17/2017  . Influenza,inj,Quad PF,6+ Mos 10/23/2015  . Influenza-Unspecified 08/30/2017  . Pneumococcal Conjugate-13 01/02/2015, 10/01/2015  . Pneumococcal Polysaccharide-23 04/11/2012  . Tdap 12/22/2016  . Zoster 01/02/2015  . Zoster Recombinat (Shingrix) 01/20/2018    Past Medical History:  Diagnosis Date  . Asthma   . Chronic kidney disease (CKD), stage III  (moderate) (HCC)   . Chronic pain   . 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 Depression 05/04/2016  . DJD (degenerative joint disease)   . Dyslipidemia   . ETOH abuse   . Foot ulcer ([place]) 07/07/2017  . GERD (gastroesophageal reflux disease)   . Hypertension   . Neuropathy   . Obesity   . OSA (obstructive sleep apnea)    Moderate OSA w AHI 17.7/hr now on VPAP at EEP at 7cm H2O, min PS 3cm H2O,max PS 15cm H2O  . RBBB     Tobacco History: Social History   Tobacco Use  Smoking Status Never Smoker  Smokeless Tobacco Never Used   Counseling given: Not Answered   Outpatient Medications Prior to Visit  Medication Sig Dispense Refill  . albuterol (PROAIR HFA) 108 (90 Base) MCG/ACT inhaler Inhale 2 puffs into the lungs every 6 (six) hours as needed for wheezing or shortness of breath. 1 Inhaler 5  . albuterol (PROVENTIL) (2.5 MG/3ML) 0.083% nebulizer solution Take 3 mLs (2.5 mg total) by nebulization every 6 (six) hours as needed for wheezing or shortness of breath. 75 mL 5  . aspirin 81 MG tablet Take 81 mg by mouth daily.      Marland Kitchen atorvastatin (LIPITOR) 20 MG tablet Take 20 mg by mouth daily.    Marland Kitchen CALCIUM PO Take by mouth.    . Cholecalciferol (VITAMIN D3) 1000 units  CAPS Take 1,000 Units by mouth.    . CO ENZYME Q-10 PO Take by mouth.    . escitalopram (LEXAPRO) 20 MG tablet Take 20 mg by mouth daily.    Marland Kitchen esomeprazole (NEXIUM) 40 MG capsule Take 40 mg by mouth daily before breakfast.      . fenofibrate 160 MG tablet TAKE 1 TABLET BY MOUTH  DAILY WITH FOOD 90 tablet 2  . furosemide (LASIX) 20 MG tablet Take 1 tablet (20 mg total) by mouth daily. 30 tablet 0  . LORazepam (ATIVAN) 1 MG tablet Take 1 mg by mouth every 8 (eight) hours.    Marland Kitchen MAGNESIUM PO Take by mouth.    . metoprolol tartrate (LOPRESSOR) 25 MG tablet Take 1 tablet  (25 mg total) by mouth 2 (two) times daily. 180 tablet 2  . Misc Natural Products (COLON CLEANSE PO) Take by mouth.    . mometasone-formoterol (DULERA) 100-5 MCG/ACT AERO Inhale 2 puffs into the lungs 2 (two) times daily. 1 Inhaler 0  . montelukast (SINGULAIR) 10 MG tablet TAKE 1 TABLET BY MOUTH AT  BEDTIME 90 tablet 3  . nitroGLYCERIN (NITROSTAT) 0.4 MG SL tablet Place 1 tablet (0.4 mg total) under the tongue every 5 (five) minutes as needed. 25 tablet 3  . oxyCODONE (ROXICODONE) 15 MG immediate release tablet Take 15 mg by mouth every 4 (four) hours as needed.      Marland Kitchen Spacer/Aero-Holding Chambers (AEROCHAMBER MV) inhaler Use as instructed 1 each 0  . tamsulosin (FLOMAX) 0.4 MG CAPS capsule Take 0.4 mg by mouth daily.     . vitamin B-12 (CYANOCOBALAMIN) 1000 MCG tablet Take 1,000 mcg by mouth daily.    Marland Kitchen zolpidem (AMBIEN) 10 MG tablet Take 10 mg by mouth at bedtime as needed for sleep.      No facility-administered medications prior to visit.     Review of Systems  Review of Systems  Constitutional: Negative.   HENT: Negative.   Respiratory: Positive for chest tightness, shortness of breath and wheezing. Negative for cough.   Cardiovascular: Positive for leg swelling. Negative for chest pain.    Physical Exam  BP 124/78 (BP Location: Right Arm, Cuff Size: Normal)   Pulse (!) 58   Temp 98.3 F (36.8 C)   Ht 5\' 8"  (1.727 m)   Wt 276 lb 3.2 oz (125.3 kg)   SpO2 94%   BMI 42.00 kg/m  Physical Exam  Constitutional: He is oriented to person, place, and time. He appears well-developed and well-nourished. No distress.  Overweight   HENT:  Head: Normocephalic and atraumatic.  Eyes: Pupils are equal, round, and reactive to light. EOM are normal.  Neck: Normal range of motion. Neck supple.  Cardiovascular: Normal rate and regular rhythm.  +2 BLE edema, R>L  Pulmonary/Chest: He is in respiratory distress. He has wheezes.  Musculoskeletal: Normal range of motion.  Neurological: He is  alert and oriented to person, place, and time.  Skin: Skin is warm and dry.  Psychiatric: He has a normal mood and affect. His behavior is normal. Judgment and thought content normal.     Lab Results:  CBC    Component Value Date/Time   WBC 4.3 07/05/2018 1135   WBC 8.0 10/28/2017 1602   WBC 5.7 11/06/2011 0317   RBC 3.91 (L) 07/05/2018 1135   HGB 12.7 (L) 07/05/2018 1135   HGB 14.1 10/28/2017 1602   HCT 37.8 (L) 07/05/2018 1135   HCT 43.8 10/28/2017 1602   PLT 178 07/05/2018  1135   PLT 201 10/28/2017 1602   MCV 96.8 07/05/2018 1135   MCV 102.6 (H) 10/28/2017 1602   MCH 32.4 07/05/2018 1135   MCHC 33.5 07/05/2018 1135   RDW 13.0 07/05/2018 1135   RDW 13.5 10/28/2017 1602   LYMPHSABS 1.2 07/05/2018 1135   LYMPHSABS 1.4 10/28/2017 1602   MONOABS 0.4 07/05/2018 1135   MONOABS 1.0 (H) 10/28/2017 1602   EOSABS 0.3 07/05/2018 1135   EOSABS 0.1 10/28/2017 1602   BASOSABS 0.0 07/05/2018 1135   BASOSABS 0.0 10/28/2017 1602    BMET    Component Value Date/Time   NA 137 09/27/2018 1358   NA 139 10/28/2017 1602   K 4.0 09/27/2018 1358   K 4.3 10/28/2017 1602   CL 100 09/27/2018 1358   CO2 30 09/27/2018 1358   CO2 26 10/28/2017 1602   GLUCOSE 102 (H) 09/27/2018 1358   GLUCOSE 99 10/28/2017 1602   BUN 11 09/27/2018 1358   BUN 16.3 10/28/2017 1602   CREATININE 0.88 09/27/2018 1358   CREATININE 0.94 07/05/2018 1135   CREATININE 1.0 10/28/2017 1602   CALCIUM 9.5 09/27/2018 1358   CALCIUM 9.3 10/28/2017 1602   GFRNONAA >60 07/05/2018 1135   GFRAA >60 07/05/2018 1135    BNP No results found for: BNP  ProBNP    Component Value Date/Time   PROBNP 232.0 (H) 09/27/2018 1358    Imaging: Dg Chest 2 View  Result Date: 09/27/2018 CLINICAL DATA:  Cough and shortness of breath for the past month. EXAM: CHEST - 2 VIEW COMPARISON:  Chest x-ray dated December 09, 2017. FINDINGS: The heart remains at the upper limits of normal in size. Normal mediastinal contours. Normal  pulmonary vascularity. Chronic central peribronchial thickening. Unchanged elevation of the left hemidiaphragm with left basilar atelectasis. No focal consolidation, pleural effusion, or pneumothorax. No acute osseous abnormality. IMPRESSION: No active cardiopulmonary disease. Stable chronic bronchitic changes and left basilar atelectasis. Electronically Signed   By: Titus Dubin M.D.   On: 09/27/2018 16:15     Assessment & Plan:   Asthma, chronic - Mild asthma exacerbation - FENO 29 - RX prednisone taper as prescribed  - Continue Dulera two puffs twice daily - Albuterol rescue inhaler 2 puffs every 6 hours for sob/wheezing (refill sent) - If rescue inhaler is not effective, then try albuterol nebulizer every 6 hours for sob/wheezing    DCM (dilated cardiomyopathy) (HCC) - Weight and leg swelling improved  - BNP remains elevated, recheck 232  - Instructed to take lasix 20mg  every other day until seen by cardiology    Martyn Ehrich, NP 10/02/2018

## 2018-09-30 ENCOUNTER — Ambulatory Visit: Payer: Medicare Other | Admitting: Primary Care

## 2018-10-02 ENCOUNTER — Encounter: Payer: Self-pay | Admitting: Primary Care

## 2018-10-02 NOTE — Assessment & Plan Note (Addendum)
-   Mild asthma exacerbation - FENO 29 - RX prednisone taper as prescribed  - Continue Dulera two puffs twice daily - Albuterol rescue inhaler 2 puffs every 6 hours for sob/wheezing (refill sent) - If rescue inhaler is not effective, then try albuterol nebulizer every 6 hours for sob/wheezing

## 2018-10-02 NOTE — Assessment & Plan Note (Addendum)
-   Weight and leg swelling improved  - BNP remains elevated, recheck 232  - Instructed to take lasix 20mg  every other day until seen by cardiology

## 2018-10-03 ENCOUNTER — Encounter: Payer: Self-pay | Admitting: Cardiology

## 2018-10-03 ENCOUNTER — Ambulatory Visit (INDEPENDENT_AMBULATORY_CARE_PROVIDER_SITE_OTHER): Payer: Medicare Other | Admitting: Cardiology

## 2018-10-03 VITALS — BP 124/68 | HR 65 | Ht 68.0 in | Wt 275.2 lb

## 2018-10-03 DIAGNOSIS — E785 Hyperlipidemia, unspecified: Secondary | ICD-10-CM | POA: Diagnosis not present

## 2018-10-03 DIAGNOSIS — G4733 Obstructive sleep apnea (adult) (pediatric): Secondary | ICD-10-CM

## 2018-10-03 DIAGNOSIS — I420 Dilated cardiomyopathy: Secondary | ICD-10-CM

## 2018-10-03 DIAGNOSIS — I2510 Atherosclerotic heart disease of native coronary artery without angina pectoris: Secondary | ICD-10-CM | POA: Diagnosis not present

## 2018-10-03 DIAGNOSIS — I10 Essential (primary) hypertension: Secondary | ICD-10-CM | POA: Diagnosis not present

## 2018-10-03 DIAGNOSIS — I5032 Chronic diastolic (congestive) heart failure: Secondary | ICD-10-CM | POA: Insufficient documentation

## 2018-10-03 NOTE — Progress Notes (Signed)
Cardiology Office Note:    Date:  10/03/2018   ID:  Keith Bernard, DOB [birth year], MRN [id number]  PCP:  [name], MD  Cardiologist:  No primary care provider on file.    Referring MD: [name], MD   Chief Complaint  Patient presents with  . Coronary Artery Disease  . Hypertension  . Hyperlipidemia  . Sleep Apnea    History of Present Illness:    Keith Bernard is a 72 y.o. 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 He has chronic LE edema from his neuropathy but this is stable and well controlled on diuretics.He has moderate obstructive sleep apnea with an AHI of 17.7/h and now on auto BiPAP.  He is here today for followup and is doing well.  He recently started having problems with SOB and wheezing and saw his PCP and was dx with acute asthmatic bronchitis related to URI.  This was treated with steroids and antibx but then developed severe LE edema and SOB persisted and a BNP was ordered which was mildly elevated c/w acute diastolic CHF.  He was started on lasix with resolution of LE edema and SOB.  He denies any chest pain or pressure, SOB, DOE, PND, orthopnea, LE edema, dizziness, palpitations or syncope. He is compliant with his meds and is tolerating meds with no SE.  He had not been compliant with his device recently due to needed a new mask which he just got as well as having a URI.  He is now back using his device and tolerating well.    Since going on PAP he feels rested in the am and has no significant daytime sleepiness.  He denies any significant mouth or nasal dryness or nasal congestion.  He does not think that he snores.     Past  Medical History:  Diagnosis Date  . Asthma   . Chronic diastolic CHF (congestive heart failure) ([place])   . Chronic kidney disease (CKD), stage III (moderate) ([facility])   . Chronic pain   . 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 Depression 05/04/2016  . DJD (degenerative joint disease)   . Dyslipidemia   . ETOH abuse   . Foot ulcer ([place]) 07/07/2017  . GERD (gastroesophageal reflux disease)   . Hypertension   . Neuropathy   . Obesity   . OSA (obstructive sleep apnea)    Moderate OSA w AHI 17.7/hr now on VPAP at EEP at 7cm H2O, min PS 3cm H2O,max PS 15cm H2O  . RBBB     Past Surgical History:  Procedure Laterality Date  . CORONARY STENT PLACEMENT    . REPLACEMENT TOTAL KNEE     left knee    Current Medications: Current Meds  Medication Sig  . albuterol (PROAIR HFA) 108 (90 Base) MCG/ACT inhaler Inhale 2 puffs into the lungs every 6 (six) hours as needed for wheezing or shortness of breath.  Marland Kitchen albuterol (PROVENTIL) (2.5 MG/3ML) 0.083% nebulizer solution Take 3 mLs (2.5  mg total) by nebulization every 6 (six) hours as needed for wheezing or shortness of breath.  Marland Kitchen aspirin 81 MG tablet Take 81 mg by mouth daily.    Marland Kitchen atorvastatin (LIPITOR) 20 MG tablet Take 20 mg by mouth daily.  Marland Kitchen CALCIUM PO Take by mouth.  . Cholecalciferol (VITAMIN D3) 1000 units CAPS Take 1,000 Units by mouth.  . CO ENZYME Q-10 PO Take by mouth.  . escitalopram (LEXAPRO) 20 MG tablet Take 20 mg by mouth daily.  Marland Kitchen esomeprazole (NEXIUM) 40 MG capsule Take 40 mg by mouth daily before breakfast.    . fenofibrate 160 MG tablet TAKE 1 TABLET BY MOUTH  DAILY WITH FOOD  . furosemide (LASIX) 20 MG tablet Take 1 tablet (20 mg total) by mouth daily.  Marland Kitchen LORazepam (ATIVAN) 1 MG tablet Take 1 mg by mouth every 8 (eight) hours.  . Magnesium 250 MG TABS Take 1 tablet by  mouth daily.  . metoprolol tartrate (LOPRESSOR) 25 MG tablet Take 1 tablet (25 mg total) by mouth 2 (two) times daily.  . Misc Natural Products (COLON CLEANSE PO) Take by mouth.  . mometasone-formoterol (DULERA) 100-5 MCG/ACT AERO Inhale 2 puffs into the lungs 2 (two) times daily.  . montelukast (SINGULAIR) 10 MG tablet TAKE 1 TABLET BY MOUTH AT  BEDTIME  . nitroGLYCERIN (NITROSTAT) 0.4 MG SL tablet Place 1 tablet (0.4 mg total) under the tongue every 5 (five) minutes as needed.  Marland Kitchen oxyCODONE (ROXICODONE) 15 MG immediate release tablet Take 15 mg by mouth every 4 (four) hours as needed.    Marland Kitchen Spacer/Aero-Holding Chambers (AEROCHAMBER MV) inhaler Use as instructed  . tamsulosin (FLOMAX) 0.4 MG CAPS capsule Take 0.4 mg by mouth daily.   . vitamin B-12 (CYANOCOBALAMIN) 1000 MCG tablet Take 1,000 mcg by mouth daily.  Marland Kitchen zolpidem (AMBIEN) 10 MG tablet Take 10 mg by mouth at bedtime as needed for sleep.      Allergies:   Patient has no known allergies.   Social History   Socioeconomic History  . Marital status: Married    Spouse name: Not on file  . Number of children: Not on file  . Years of education: Not on file  . Highest education level: Not on file  Occupational History  . Occupation: Risk manager  Social Needs  . Financial resource strain: Not on file  . Food insecurity:    Worry: Not on file    Inability: Not on file  . Transportation needs:    Medical: Not on file    Non-medical: Not on file  Tobacco Use  . Smoking status: Never Smoker  . Smokeless tobacco: Never Used  Substance and Sexual Activity  . Alcohol use: Yes    Alcohol/week: 0.0 standard drinks    Comment: Moderate, beer  . Drug use: No  . Sexual activity: Not on file  Lifestyle  . Physical activity:    Days per week: Not on file    Minutes per session: Not on file  . Stress: Not on file  Relationships  . Social connections:    Talks on phone: Not on file    Gets together: Not on file    Attends  religious service: Not on file    Active member of club or organization: Not on file    Attends meetings of clubs or organizations: Not on file    Relationship status: Not on file  Other Topics Concern  . Not on file  Social History Narrative  .  Not on file     Family History: The patient's family history includes COPD in his mother; Other in his cousin; Pancreatic cancer in his father.  ROS:   Please see the history of present illness.    ROS  All other systems reviewed and negative.   EKGs/Labs/Other Studies Reviewed:    The following studies were reviewed today: PAP download  EKG:  EKG is  ordered today.  The ekg ordered today demonstrates normal sinus rhythm at 65 bpm right bundle branch block and left anterior fascicular block -no significant change from 2016.  Recent Labs: 01/03/2018: TSH 2.883 07/05/2018: ALT 12; Hemoglobin 12.7; Platelet Count 178 09/27/2018: BUN 11; Creatinine, Ser 0.88; Potassium 4.0; Pro B Natriuretic peptide (BNP) 232.0; Sodium 137   Recent Lipid Panel    Component Value Date/Time   CHOL 122 03/15/2018 1503   TRIG 55 03/15/2018 1503   HDL 62 03/15/2018 1503   CHOLHDL 2.0 03/15/2018 1503   CHOLHDL 1.9 05/04/2016 1029   VLDL 13 05/04/2016 1029   LDLCALC 49 03/15/2018 1503   LDLDIRECT 33.0 05/07/2015 1233    Physical Exam:    VS:  BP 124/68   Pulse 65   Ht 5\' 8"  (1.727 m)   Wt 275 lb 3.2 oz (124.8 kg)   SpO2 90%   BMI 41.84 kg/m     Wt Readings from Last 3 Encounters:  10/03/18 275 lb 3.2 oz (124.8 kg)  09/27/18 276 lb 3.2 oz (125.3 kg)  09/21/18 286 lb 9.6 oz (130 kg)     GEN:  Well nourished, well developed in no acute distress HEENT: Normal NECK: No JVD; No carotid bruits LYMPHATICS: No lymphadenopathy CARDIAC: RRR, no murmurs, rubs, gallops RESPIRATORY:  Clear to auscultation without rales, wheezing or rhonchi  ABDOMEN: Soft, non-tender, non-distended MUSCULOSKELETAL:  No edema; No deformity  SKIN: Warm and dry NEUROLOGIC:   Alert and oriented x 3 PSYCHIATRIC:  Normal affect   ASSESSMENT:    1. Coronary artery disease involving native coronary artery of native heart without angina pectoris   2. Essential hypertension, benign   3. DCM (dilated cardiomyopathy) ([place])   4. Dyslipidemia   5. Obstructive sleep apnea   6. Chronic diastolic congestive heart failure ([place])    PLAN:    In order of problems listed above:  1.  ASCAD - status post ST MI anterior with PCI with bare-metal stent of the LAD on 04/12/2009 with cath also showing 20% proximal RCA, 30% mid RCA .  He denies any anginal sx.  He will continue on aspirin 81 mg daily, atorvastatin and beta-blocker.  2.  HTN -BP is well controlled exam today.  Continue Lopressor 25 mg twice daily.    3.  DCM -mixed ischemic and nonischemic.  Echo 2010 showed EF 50 to 55% and 71% by stress Myoview in 2013.  Given recent CHF sx will repeat echo to make sure LVF is still preserved.  4.  Hyperlipidemia -LDL goal is less than 70.  His LDL is 49 on 03/05/2018 and will continue atorvastatin 20 mg daily and fenofibrate 160 mg daily.  5.  OSA - the patient is tolerating PAP therapy well without any problems. The PAP download was reviewed today and showed an AHI of 5.5/hr on auto BIPAP  with 21% compliance in using more than 4 hours nightly.  The patient has been using and benefiting from PAP use and will continue to benefit from therapy. His compliance fell off when he was sick with  a URI and also was having problems with his PAP mask. He now has a new mask and is back to using his PAP with no problems.  I encouraged him to be compliant with his device.   6.  Chronic diastolic CHF - he recently had acute diastolic CHF exacerbation likely related to recent URI with acute bronchitis/asthmatic exacerbation treated with steroids likely resulting in volume overload and acute diastolic CHF.  He is not on Lasix qod with complete resolution of LE edema and SOB.  He will continue on Lasix.  I  will check a 2D echo to assess LVF.  Counseling on low sodium diet.      Medication Adjustments/Labs and Tests Ordered: Current medicines are reviewed at length with the patient today.  Concerns regarding medicines are outlined above.  Orders Placed This Encounter  Procedures  . EKG 12-Lead  . ECHOCARDIOGRAM COMPLETE   No orders of the defined types were placed in this encounter.   Signed, Fransico Him, MD  10/03/2018 6:27 PM    [place]

## 2018-10-03 NOTE — Patient Instructions (Signed)
Medication Instructions:  Your physician recommends that you continue on your current medications as directed. Please refer to the Current Medication list given to you today.  If you need a refill on your cardiac medications before your next appointment, please call your pharmacy.   Lab work:  If you have labs (blood work) drawn today and your tests are completely normal, you will receive your results only by: Marland Kitchen MyChart Message (if you have MyChart) OR . A paper copy in the mail If you have any lab test that is abnormal or we need to change your treatment, we will call you to review the results.  Testing/Procedures: Your physician has requested that you have an echocardiogram. Echocardiography is a painless test that uses sound waves to create images of your heart. It provides your doctor with information about the size and shape of your heart and how well your heart's chambers and valves are working. This procedure takes approximately one hour. There are no restrictions for this procedure.  Follow-Up: At [facility], you and your health needs are our priority.  As part of our continuing mission to provide you with exceptional heart care, we have created designated Provider Care Teams.  These Care Teams include your primary Cardiologist (physician) and Advanced Practice Providers (APPs -  Physician Assistants and Nurse Practitioners) who all work together to provide you with the care you need, when you need it. You will need a follow up appointment in 6 months.  Please call our office 2 months in advance to schedule this appointment.  You may see Dr. Radford Pax or one of the following Advanced Practice Providers on your designated Care Team:   [place], PA-C Melina Copa, PA-C . Ermalinda Barrios, PA-C

## 2018-10-11 ENCOUNTER — Ambulatory Visit (HOSPITAL_COMMUNITY): Payer: Medicare Other | Attending: Cardiology

## 2018-10-11 ENCOUNTER — Other Ambulatory Visit: Payer: Self-pay

## 2018-10-11 DIAGNOSIS — I5032 Chronic diastolic (congestive) heart failure: Secondary | ICD-10-CM | POA: Insufficient documentation

## 2018-10-11 MED ORDER — PERFLUTREN LIPID MICROSPHERE
1.0000 mL | INTRAVENOUS | Status: AC | PRN
  Administered 2018-10-11: 2 mL via INTRAVENOUS

## 2018-10-12 ENCOUNTER — Other Ambulatory Visit: Payer: Self-pay

## 2018-10-12 ENCOUNTER — Telehealth: Payer: Self-pay

## 2018-10-12 DIAGNOSIS — I10 Essential (primary) hypertension: Secondary | ICD-10-CM

## 2018-10-12 DIAGNOSIS — I5032 Chronic diastolic (congestive) heart failure: Secondary | ICD-10-CM

## 2018-10-12 DIAGNOSIS — I2510 Atherosclerotic heart disease of native coronary artery without angina pectoris: Secondary | ICD-10-CM

## 2018-10-12 DIAGNOSIS — I420 Dilated cardiomyopathy: Secondary | ICD-10-CM

## 2018-10-12 MED ORDER — FUROSEMIDE 20 MG PO TABS: ORAL_TABLET | ORAL | 0 refills | Status: DC

## 2018-10-12 NOTE — Telephone Encounter (Signed)
-----   Message from [name], MD sent at 10/11/2018  6:08 PM EST ----- Echo showed normal LVF with increased stiffness of heart muscle, mild LAE

## 2018-10-12 NOTE — Telephone Encounter (Signed)
Pt advised per Dr. Radford Pax his Echo results and to come in Friday 10/14/18 for bmet and BNP.Keith Bernard To clarify.Keith Kitchenpts wife reports that the pt has only been taking his Lasix 20mg  QOD.

## 2018-10-14 ENCOUNTER — Other Ambulatory Visit: Payer: Medicare Other

## 2018-10-17 ENCOUNTER — Other Ambulatory Visit: Payer: Medicare Other

## 2018-10-17 DIAGNOSIS — I5032 Chronic diastolic (congestive) heart failure: Secondary | ICD-10-CM

## 2018-10-17 DIAGNOSIS — I2510 Atherosclerotic heart disease of native coronary artery without angina pectoris: Secondary | ICD-10-CM

## 2018-10-17 DIAGNOSIS — I420 Dilated cardiomyopathy: Secondary | ICD-10-CM

## 2018-10-17 DIAGNOSIS — I10 Essential (primary) hypertension: Secondary | ICD-10-CM

## 2018-10-18 LAB — BASIC METABOLIC PANEL
BUN / CREAT RATIO: 18 (ref 10–24)
BUN: 17 mg/dL (ref 8–27)
CO2: 24 mmol/L (ref 20–29)
CREATININE: 0.96 mg/dL (ref 0.76–1.27)
Calcium: 9.1 mg/dL (ref 8.6–10.2)
Chloride: 93 mmol/L — ABNORMAL LOW (ref 96–106)
GFR calc Af Amer: 90 mL/min/{1.73_m2} (ref >59)
GFR calc non Af Amer: 78 mL/min/{1.73_m2} (ref >59)
GLUCOSE: 100 mg/dL — AB (ref 65–99)
Potassium: 4.3 mmol/L (ref 3.5–5.2)
Sodium: 141 mmol/L (ref 134–144)

## 2018-10-18 LAB — PRO B NATRIURETIC PEPTIDE: NT-PRO BNP: 367 pg/mL (ref 0–376)

## 2018-11-07 ENCOUNTER — Ambulatory Visit: Payer: Medicare Other | Admitting: Internal Medicine

## 2018-11-11 ENCOUNTER — Encounter: Payer: Self-pay | Admitting: Internal Medicine

## 2018-11-11 ENCOUNTER — Ambulatory Visit (INDEPENDENT_AMBULATORY_CARE_PROVIDER_SITE_OTHER): Payer: Medicare Other | Admitting: Internal Medicine

## 2018-11-11 VITALS — BP 122/82 | HR 69 | Ht 69.0 in | Wt 276.0 lb

## 2018-11-11 DIAGNOSIS — I2510 Atherosclerotic heart disease of native coronary artery without angina pectoris: Secondary | ICD-10-CM

## 2018-11-11 DIAGNOSIS — J4540 Moderate persistent asthma, uncomplicated: Secondary | ICD-10-CM | POA: Diagnosis not present

## 2018-11-11 DIAGNOSIS — J4541 Moderate persistent asthma with (acute) exacerbation: Secondary | ICD-10-CM | POA: Diagnosis not present

## 2018-11-11 DIAGNOSIS — J209 Acute bronchitis, unspecified: Secondary | ICD-10-CM | POA: Diagnosis not present

## 2018-11-11 LAB — NITRIC OXIDE: Nitric Oxide: 11

## 2018-11-11 MED ORDER — DOXYCYCLINE HYCLATE 100 MG PO TABS: 100.0000 mg | ORAL_TABLET | Freq: Two times a day (BID) | ORAL | 0 refills | Status: DC

## 2018-11-11 NOTE — Patient Instructions (Addendum)
ICD-10-CM   1. Acute bronchitis, unspecified organism J20.9 Nitric oxide  2. Moderate persistent asthma without complication P80.99    Mild acute bronchits ASthma seems stable Exposure to mold and cats make control difficult  Plan - Take doxycycline 100mg  po twice daily x 5 days; take after meals and avoid sunlight - continue dulera scheduled  - allergy workup - when you feel you can afford; holding off for now - contrinue cpap and edema care -At some point depending on progress consider CT chest.  Followup 6 months or sooner if needed

## 2018-11-11 NOTE — Progress Notes (Signed)
OV 01/29/2016  Chief Complaint  Patient presents with  . Follow-up    Pt here after acute visit with SG on 2.10.17. Pt states his breathing has imrpoved since last OV. Pt states he has occassional wheezing and mild non prod cough. Pt denies CP/tightness.     [name] returns for chronic obstructive lung disease asthma follow-up. He is now on Dulera plus Singulair. He is compliant with Dulera. He says with improved compliance his symptoms are a lot better. His wife is here with him. Most recently he had a flareup and was treated with prednisone. I review the chart when he was seen by [name] gross my nurse practitioner. Currently he feels asthma is well controlled. However he still uses albuterol multiple times a day for rescue but denies any nocturnal symptoms or daytime symptoms. He is compliant with this CPAP for sleep apnea.  Review of the chart shows this is in December 2012 has elevated eos 400 cells per cubic millimeter. I notice that and IgE was never checked. In any event subjectively is well controlled currently. Also noted on alpha-1 has not been checked.  Asthma control questionnaire shows that at night he hardly ever wakes up because of asthma. When he wakes up he has very mild symptoms. In the daytime he is moderately limited because of asthma although this could be because of obesity. He also has moderate shortness of breath because of asthma although this could be because of obesity. He is wheezing a lot of the time and uses albuterol 3-4 puffs on most days. Nevertheless average score of 2.2 show significant improvement after starting [place] 05/04/2016  Chief Complaint  Patient presents with  . Follow-up    Pt c/o occasional wheeze since not being on Breo for about a week. Pt has been using albuterol HFA more. Pt denies cough/SOB/CP/tightness.    Follow-up asthma. Last seen March 2017. This is a routine follow-up. I put him on Breo when she really liked. However the  cost dollars out of pocket because he is in the donut hole. So he ran out of it over a week ago and is having increased symptoms. Asthma control questionnaire is 2.0 and still within his range from prior visits. Exhaled nitric oxide is just over 25 suggests some amount of active airway inflammation. His wife is here with him and she says that she got approved for the [facility] patient assistance program but apparently between our office in the mailroom the paperwork has been loss. She is willing to redo the paperwork and she is wondering if I could sign my part of it this week.  In terms of symptoms specifically tells me that he does wake up a few times at night. When he wakes up he has very mild symptoms. Slightly limited in his activities because of asthma. He has moderate amount of shortness of breath with exertion. He does wheeze a little of the time and is using albuterol for rescue at least one or 2 times daily. 5 point score is 2/.0 showing activity - is worse this past week or so is within normal   OV 01/04/2017   Chief Complaint  Patient presents with  . Follow-up    Pt states last week he had an 'asthma attack' and went to ED in [place] and was given steroid and abx per pt's wife. Pt states he feels he is improving. Pt states he does not feel back to baseline. Pt denies CP/tightness and  f/c/s.     Follow-up moderate persistent asthma  Last seen June 2017. He presents with his wife as always. He is on Dulera through the assistance program. Some 3 weeks ago his sister died from terminal cancer. Take a flight to [place]. His been dealing with those emotions. Because of the travel, emotions and weather change some 10 days ago he had an asthma exacerbation but never below few days with increased cough and wheezing. He went to a local emergency department and got one shot of steroids and was given 7 days of cephalexin which is completed. Despite this is not fully better. He still has some cough  and wheeze and yellow sputum. There is no fever.   OV 07/07/2017  Chief Complaint  Patient presents with  . Follow-up    Pt using Dulera, still having trouble getting this covered by insurance and getting Financial Assistance/Patient Assistance. Pt states that his breathing is doing better after senc round of Abx/Prednisone.      Follow-up moderate persistent asthma   Last seen February 2018. He presents with his wife. Overall he is doing stable except with the summer heat his symptoms are slightly more than usual as seen in the asthma control questionnaire. He says that he does not wake up at night because of asthma. When he wakes up he has mild symptoms. He has limitation in his activities because of asthma but this could also be because of obesity and DJD and spine issues. He does get dyspneic for moderate amount of the time and is wheezing a lot of the time but using albuterol for rescue 1-2 puffs per day. He's having significant difficulty getting [employer] to pay for his assistance program and is in need of samples today. He continues on Singulair.   OV 12/09/2017  Chief Complaint  Patient presents with  . Follow-up    shortness of breath   Follow-up moderate persistent asthma  Routine 70-month follow-up.  He presents with his wife.  Overall he is doing fine.  He has significant symptoms on his asthma control questionnaire.  In fact his symptoms are slowly deteriorated.  Average score is 2.8.  But this resolved mostly shortness of breath.  He feels is due to obesity.  His nitric oxide is 12 showing good asthma control on Dulera and Singulair.  He plans to lose weight.  Recently had a fall and hurt the shoulder.  Review of the images show is not had a chest x-ray since 2010.  He is up-to-date with his flu shot.  In terms of his asthma control questionnaire he hardly ever wakes up at night.  When he wakes up he has moderate symptoms.  He is limited with his activities moderately and does  express a moderate amount of shortness of breath but he does wheeze occasionally.  He is using albuterol for rescue multiple times a day.  =  OV 08/05/2018  Subjective:  Patient ID: [name], male , DOB: January 31, 1946 , age 27 y.o. , MRN: [id number] , ADDRESS: [place] [id number]   08/05/2018 -   Chief Complaint  Patient presents with  . Follow-up    Pt has been sick and was put on prednisone and abx which he began Monday, 9/2.  Pt has c/o cough with green mucus, increased SOB. Denies any CP or chest tightness.     HPI KHOLE BRANCH 72 y.o. -moderate persistent asthma patient here for follow-up. He is here with his  wife. They tell me that has been many months since he has taken his inhale corticosteroid Dulera because the patient assistance program has been slow to respond.He is relying on sampples. Apparently got some Symbicort from usat some point in the past since he last saw me. However he has run out of that as well. He is not sure how long it is since he ran out of that. So he is having a flareup. He is just dependent on prednisone for the last 5 days with antibiotic symptoms by our nurse practitioner.He does not feel fully back to baseline. His asthma control question is 3.2 showing significant nocturnal symptoms or albuterol rescue use OF breath and wheezing. In fact on exam he had some wheezing is exhaled nitric oxide was 28 while on prednisone       OV 11/11/2018  Subjective:  Patient ID: [name], male , DOB: 1946/07/20 , age 72 y.o. , MRN: [id number] , ADDRESS: [place] [id number]   11/11/2018 -   Chief Complaint  Patient presents with  . Follow-up    f/u asthma. Pt states he has had a recent flare up, wheezing has increased.   Moderate persistent asthma for follow-up.  He is on [place] and Singulair  HPI OSEPH IMBURGIA 72 y.o. -for the last 3 days he is reporting increased cough with congestion and some  mild increase in wheezing with green sputum.  Asthma control questionnaire shows that he is waking up a few times at night.  When he wakes up he is quite severe symptoms he is very limited in his activities because of asthma experiencing a very great deal of shortness of breath and wheezing all the time using albuterol for rescue 5 times daily.  However nitric oxide test is normal.  Clinically is not wheezing actively.  Recently had edema and my nurse practitioner started him on Lasix.  He tells me there are a bunch of cats at home and also significant mold exposure.  The wife is interested in allergy testing but they do not have the financial resources to get rid of the cats or get rid of the mold to sell the house.  They are frustrated by all this.  He says today he will be content taking antibiotic and if it gets worse doing prednisone.  Last chest x-ray October 2019.  He is never had CT chest.   Asthma Control Panel -December 2012eos 400 cells per cubic millimeter \ - IgE not checked as of 12/09/2017  - obstn with low dlco nov 2016 on PFT    - Alpha 1 is MM 08/23/2015  01/29/2016  05/04/2016  07/07/2017  12/09/2017  08/05/2018  11/11/2018   Current Med Regimen symbicort  Dulera  Off breo for several days.  On dulera 100. Struggling to afford and singulair dulera and singulair dulera and singulair dulra and singulair  ACQ 5 point- 1 week. wtd avg score. <1.0 is good control 0.75-1.25 is grey zone. >1.25 poor control. Delta 0.5 is clinically meaningful 3.2 2.2 2.0 2.6 2.8 3.2 4.2  FeNO ppB 34 x 26  13 28  on prednisone 11  FeV1  x  x      Planned intervention  for visit Start dulera + singulair Cont dulera         ROS - per HPI     has a past medical history of Asthma, Chronic diastolic CHF (congestive heart failure) ([place]), Chronic kidney disease (CKD), stage III (  moderate) ([place]), Chronic pain, Coronary artery disease, Depression (05/04/2016), DJD (degenerative joint disease), Dyslipidemia, ETOH  abuse, Foot ulcer ([place]) (07/07/2017), GERD (gastroesophageal reflux disease), Hypertension, Neuropathy, Obesity, OSA (obstructive sleep apnea), and RBBB.   reports that he has never smoked. He has never used smokeless tobacco.  Past Surgical History:  Procedure Laterality Date  . CORONARY STENT PLACEMENT    . REPLACEMENT TOTAL KNEE     left knee    No Known Allergies  Immunization History  Administered Date(s) Administered  . DTaP 07/22/2001  . Influenza Split 09/30/2014  . Influenza, High Dose Seasonal PF 11/04/2016, 08/17/2017, 11/03/2018  . Influenza,inj,Quad PF,6+ Mos 10/23/2015  . Influenza-Unspecified 08/30/2017  . Pneumococcal Conjugate-13 01/02/2015, 10/01/2015  . Pneumococcal Polysaccharide-23 04/11/2012  . Tdap 12/22/2016  . Zoster 01/02/2015  . Zoster Recombinat (Shingrix) 01/20/2018    Family History  Problem Relation Age of Onset  . COPD Mother   . Pancreatic cancer Father        pancreatic   . Other Cousin        Amyloidosis     Current Outpatient Medications:  .  albuterol (PROAIR HFA) 108 (90 Base) MCG/ACT inhaler, Inhale 2 puffs into the lungs every 6 (six) hours as needed for wheezing or shortness of breath., Disp: 1 Inhaler, Rfl: 5 .  albuterol (PROVENTIL) (2.5 MG/3ML) 0.083% nebulizer solution, Take 3 mLs (2.5 mg total) by nebulization every 6 (six) hours as needed for wheezing or shortness of breath., Disp: 75 mL, Rfl: 5 .  aspirin 81 MG tablet, Take 81 mg by mouth daily.  , Disp: , Rfl:  .  atorvastatin (LIPITOR) 20 MG tablet, Take 20 mg by mouth daily., Disp: , Rfl:  .  CALCIUM PO, Take by mouth., Disp: , Rfl:  .  Cholecalciferol (VITAMIN D3) 1000 units CAPS, Take 1,000 Units by mouth., Disp: , Rfl:  .  CO ENZYME Q-10 PO, Take by mouth., Disp: , Rfl:  .  escitalopram (LEXAPRO) 20 MG tablet, Take 20 mg by mouth daily., Disp: , Rfl:  .  esomeprazole (NEXIUM) 40 MG capsule, Take 40 mg by mouth daily before breakfast.  , Disp: , Rfl:  .  fenofibrate 160  MG tablet, TAKE 1 TABLET BY MOUTH  DAILY WITH FOOD, Disp: 90 tablet, Rfl: 2 .  furosemide (LASIX) 20 MG tablet, Take one tablet by mouth every other day., Disp: 30 tablet, Rfl: 0 .  LORazepam (ATIVAN) 1 MG tablet, Take 1 mg by mouth every 8 (eight) hours., Disp: , Rfl:  .  Magnesium 250 MG TABS, Take 1 tablet by mouth daily., Disp: , Rfl:  .  metoprolol tartrate (LOPRESSOR) 25 MG tablet, Take 1 tablet (25 mg total) by mouth 2 (two) times daily., Disp: 180 tablet, Rfl: 2 .  Misc Natural Products (COLON CLEANSE PO), Take by mouth., Disp: , Rfl:  .  mometasone-formoterol (DULERA) 100-5 MCG/ACT AERO, Inhale 2 puffs into the lungs 2 (two) times daily., Disp: 1 Inhaler, Rfl: 0 .  montelukast (SINGULAIR) 10 MG tablet, TAKE 1 TABLET BY MOUTH AT  BEDTIME, Disp: 90 tablet, Rfl: 3 .  nitroGLYCERIN (NITROSTAT) 0.4 MG SL tablet, Place 1 tablet (0.4 mg total) under the tongue every 5 (five) minutes as needed., Disp: 25 tablet, Rfl: 3 .  oxyCODONE (ROXICODONE) 15 MG immediate release tablet, Take 15 mg by mouth every 4 (four) hours as needed.  , Disp: , Rfl:  .  Spacer/Aero-Holding Chambers (AEROCHAMBER MV) inhaler, Use as instructed, Disp: 1 each, Rfl: 0 .  tamsulosin (FLOMAX) 0.4 MG CAPS capsule, Take 0.4 mg by mouth daily. , Disp: , Rfl:  .  vitamin B-12 (CYANOCOBALAMIN) 1000 MCG tablet, Take 1,000 mcg by mouth daily., Disp: , Rfl:  .  zolpidem (AMBIEN) 10 MG tablet, Take 10 mg by mouth at bedtime as needed for sleep. , Disp: , Rfl:  .  doxycycline (VIBRA-TABS) 100 MG tablet, Take 1 tablet (100 mg total) by mouth 2 (two) times daily., Disp: 10 tablet, Rfl: 0      Objective:   Vitals:   11/11/18 1209  BP: 122/82  Pulse: 69  SpO2: 93%  Weight: 276 lb (125.2 kg)  Height: 5\' 9"  (1.753 m)    Estimated body mass index is 40.76 kg/m as calculated from the following:   Height as of this encounter: 5\' 9"  (1.753 m).   Weight as of this encounter: 276 lb (125.2 kg).  @WEIGHTCHANGE @  [employer]    11/11/18 1209  Weight: 276 lb (125.2 kg)     Physical Exam  General Appearance:    Alert, cooperative, no distress, appears stated age - older , Deconditioned looking - yes , OBESE  - yes, Sitting on Wheelchair -  no  Head:    Normocephalic, without obvious abnormality, atraumatic  Eyes:    PERRL, conjunctiva/corneas clear,  Ears:    Normal TM's and external ear canals, both ears  Nose:   Nares normal, septum midline, mucosa normal, no drainage    or sinus tenderness. OXYGEN ON  - no . Patient is @ ra   Throat:   Lips, mucosa, and tongue normal; teeth and gums normal. Cyanosis on lips - no  Neck:   Supple, symmetrical, trachea midline, no adenopathy;    thyroid:  no enlargement/tenderness/nodules; no carotid   bruit or JVD  Back:     Symmetric, no curvature, ROM normal, no CVA tenderness  Lungs:     Distress - no , Wheeze no, Barrell Chest - no, Purse lip breathing - no, Crackles - no   Chest Wall:    No tenderness or deformity.    Heart:    Regular rate and rhythm, S1 and S2 normal, no rub   or gallop, Murmur - no  Breast Exam:    NOT DONE  Abdomen:     Soft, non-tender, bowel sounds active all four quadrants,    no masses, no organomegaly. Visceral obesity - yes  Genitalia:   NOT DONE  Rectal:   NOT DONE  Extremities:   Extremities - normal, Has Cane - no but knee in brace, Clubbing - no, Edema - no  Pulses:   2+ and symmetric all extremities  Skin:   Stigmata of Connective Tissue Disease - no  Lymph nodes:   Cervical, supraclavicular, and axillary nodes normal  Psychiatric:  Neurologic:   Pleasant - yes, Anxious - no, Flat affect - no  CAm-ICU - neg, Alert and Oriented x 3 - yes, Moves all 4s - yes, Speech - normal, Cognition - intact           Assessment:       ICD-10-CM   1. Acute bronchitis, unspecified organism J20.9 Nitric oxide  2. Moderate persistent asthma without complication J19.41        Plan:     Patient Instructions     ICD-10-CM   1. Acute  bronchitis, unspecified organism J20.9 Nitric oxide  2. Moderate persistent asthma without complication D40.81    Mild acute bronchits ASthma seems stable Exposure  to mold and cats make control difficult  Plan - Take doxycycline 100mg  po twice daily x 5 days; take after meals and avoid sunlight - continue dulera scheduled  - allergy workup - when you feel you can afford; holding off for now - contrinue cpap and edema care -At some point depending on progress consider CT chest.  Followup 6 months or sooner if needed     SIGNATURE    Dr. Brand Males, M.D., F.C.C.P,  Pulmonary and Critical Care Medicine Staff Physician, [place] Director - Interstitial Lung Disease  Program  Pulmonary [place] [facility] [place], [place], [address]  Pager: [phone], If no answer or between  15:00h - 7:00h: call [phone] Telephone: [phone]  2:00 PM 11/11/2018

## 2018-11-18 ENCOUNTER — Other Ambulatory Visit: Payer: Self-pay | Admitting: Primary Care

## 2018-12-08 ENCOUNTER — Telehealth: Payer: Self-pay | Admitting: Internal Medicine

## 2018-12-08 NOTE — Telephone Encounter (Signed)
lmtcb x1 for pt. 

## 2018-12-09 NOTE — Telephone Encounter (Signed)
Spoke with pt's wife. Keith Bernard. States that pt needs a refill on Lasix. Advised her that [facility] only gave them this prescription until he could get an appointment with Cardiology. Pt's wife is going to call Cardiology for this refill. Nothing further was needed at this time.

## 2018-12-15 ENCOUNTER — Other Ambulatory Visit: Payer: Self-pay | Admitting: Cardiology

## 2018-12-15 MED ORDER — FUROSEMIDE 20 MG PO TABS: ORAL_TABLET | ORAL | 3 refills | Status: DC

## 2018-12-15 NOTE — Telephone Encounter (Signed)
New Message    *STAT* If patient is at the pharmacy, call can be transferred to refill team.   1. Which medications need to be refilled? (please list name of each medication and dose if known) furosemide (LASIX) 20 MG tablet  2. Which pharmacy/location (including street and city if local pharmacy) is medication to be sent to? [place], [place] [facility] [address]  3. Do they need a 30 day or 90 day supply? 90    Patients wife states that pulmonary will no longer prescribe lasix that they want cardiology to handle the refills.

## 2018-12-15 NOTE — Telephone Encounter (Signed)
Pt's medication was sent to pt's pharmacy as requested. Confirmation received.  °

## 2019-01-10 MED ORDER — FUROSEMIDE 20 MG PO TABS: 20.0000 mg | ORAL_TABLET | ORAL | 3 refills | Status: DC

## 2019-01-10 NOTE — Telephone Encounter (Signed)
Spoke with the patient's wife, she was confused about the prescription. I advised that it was placed on 12/15/18. However, the pharmacy had the order but could not refill. I sent in a new prescription to correct the issue.

## 2019-01-10 NOTE — Telephone Encounter (Signed)
Spoke with the patient's wife and she had no further questions.

## 2019-01-11 ENCOUNTER — Inpatient Hospital Stay: Payer: Medicare Other | Attending: Hematology

## 2019-01-11 DIAGNOSIS — D472 Monoclonal gammopathy: Secondary | ICD-10-CM | POA: Insufficient documentation

## 2019-01-11 DIAGNOSIS — Z79899 Other long term (current) drug therapy: Secondary | ICD-10-CM | POA: Insufficient documentation

## 2019-01-11 DIAGNOSIS — G4733 Obstructive sleep apnea (adult) (pediatric): Secondary | ICD-10-CM | POA: Diagnosis not present

## 2019-01-11 DIAGNOSIS — R5383 Other fatigue: Secondary | ICD-10-CM | POA: Diagnosis not present

## 2019-01-11 DIAGNOSIS — I2510 Atherosclerotic heart disease of native coronary artery without angina pectoris: Secondary | ICD-10-CM | POA: Insufficient documentation

## 2019-01-11 DIAGNOSIS — Z7982 Long term (current) use of aspirin: Secondary | ICD-10-CM | POA: Diagnosis not present

## 2019-01-11 DIAGNOSIS — I5032 Chronic diastolic (congestive) heart failure: Secondary | ICD-10-CM | POA: Diagnosis not present

## 2019-01-11 DIAGNOSIS — E785 Hyperlipidemia, unspecified: Secondary | ICD-10-CM | POA: Diagnosis not present

## 2019-01-11 LAB — CMP (CANCER CENTER ONLY)
ALBUMIN: 3.9 g/dL (ref 3.5–5.0)
ALK PHOS: 70 U/L (ref 38–126)
ALT: 12 U/L (ref 0–44)
AST: 18 U/L (ref 15–41)
Anion gap: 8 (ref 5–15)
BUN: 19 mg/dL (ref 8–23)
CO2: 29 mmol/L (ref 22–32)
Calcium: 9.2 mg/dL (ref 8.9–10.3)
Chloride: 101 mmol/L (ref 98–111)
Creatinine: 0.94 mg/dL (ref 0.61–1.24)
GFR, Est AFR Am: >60 mL/min (ref >60)
GFR, Estimated: >60 mL/min (ref >60)
GLUCOSE: 93 mg/dL (ref 70–99)
Potassium: 4.9 mmol/L (ref 3.5–5.1)
Sodium: 138 mmol/L (ref 135–145)
Total Bilirubin: 0.8 mg/dL (ref 0.3–1.2)
Total Protein: 7.3 g/dL (ref 6.5–8.1)

## 2019-01-11 LAB — CBC WITH DIFFERENTIAL/PLATELET
Abs Immature Granulocytes: 0.04 10*3/uL (ref 0.00–0.07)
Basophils Absolute: 0.1 10*3/uL (ref 0.0–0.1)
Basophils Relative: 1 %
Eosinophils Absolute: 0.3 10*3/uL (ref 0.0–0.5)
Eosinophils Relative: 4 %
HCT: 40.8 % (ref 39.0–52.0)
Hemoglobin: 13.1 g/dL (ref 13.0–17.0)
Immature Granulocytes: 1 %
Lymphocytes Relative: 22 %
Lymphs Abs: 1.4 10*3/uL (ref 0.7–4.0)
MCH: 31 pg (ref 26.0–34.0)
MCHC: 32.1 g/dL (ref 30.0–36.0)
MCV: 96.7 fL (ref 80.0–100.0)
Monocytes Absolute: 0.7 10*3/uL (ref 0.1–1.0)
Monocytes Relative: 11 %
Neutro Abs: 4.2 10*3/uL (ref 1.7–7.7)
Neutrophils Relative %: 61 %
Platelets: 211 10*3/uL (ref 150–400)
RBC: 4.22 MIL/uL (ref 4.22–5.81)
RDW: 13.1 % (ref 11.5–15.5)
WBC: 6.7 10*3/uL (ref 4.0–10.5)
nRBC: 0 % (ref 0.0–0.2)

## 2019-01-12 LAB — KAPPA/LAMBDA LIGHT CHAINS
Kappa free light chain: 19.2 mg/L (ref 3.3–19.4)
Kappa, lambda light chain ratio: 1.35 (ref 0.26–1.65)
Lambda free light chains: 14.2 mg/L (ref 5.7–26.3)

## 2019-01-14 LAB — MULTIPLE MYELOMA PANEL, SERUM
ALBUMIN SERPL ELPH-MCNC: 3.7 g/dL (ref 2.9–4.4)
Albumin/Glob SerPl: 1.3 (ref 0.7–1.7)
Alpha 1: 0.2 g/dL (ref 0.0–0.4)
Alpha2 Glob SerPl Elph-Mcnc: 0.7 g/dL (ref 0.4–1.0)
B-Globulin SerPl Elph-Mcnc: 1 g/dL (ref 0.7–1.3)
Gamma Glob SerPl Elph-Mcnc: 0.9 g/dL (ref 0.4–1.8)
Globulin, Total: 2.9 g/dL (ref 2.2–3.9)
IGM (IMMUNOGLOBULIN M), SRM: 34 mg/dL (ref 15–143)
IgA: 243 mg/dL (ref 61–437)
IgG (Immunoglobin G), Serum: 1056 mg/dL (ref 700–1600)
M Protein SerPl Elph-Mcnc: 0.3 g/dL — ABNORMAL HIGH
TOTAL PROTEIN ELP: 6.6 g/dL (ref 6.0–8.5)

## 2019-01-17 NOTE — Progress Notes (Signed)
HEMATOLOGY/ONCOLOGY CLINIC NOTE  Date of Service: 01/18/2019  Patient Care Team: [name], MD as PCP - General (Family Medicine) [name], MD as Consulting Physician (Cardiology)  CHIEF COMPLAINTS/PURPOSE OF CONSULTATION:  MGUS  HISTORY OF PRESENTING ILLNESS:   Keith Bernard is a wonderful 73 y.o. male who has been previously followed by my colleague Dr. 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 He is accompanied today by his wife. The pt reports that he is doing well overall.   The pt reports that he has had less energy over the last couple years but denies any recent changes in his energy levels. The pt's wife notes that his PCP originally referred him to a nephrologist for observed blood and protein in the urine. This work up ended up in a referral to my colleague Dr. Lebron Conners.   The pt notes that he is often tired, doesn't sleep very well, as he ends up watching TV into the morning and isn't always compliant with his BIPAP. He does not endorse any other concerns that have developed over the last couple months.   The pt notes that he uses Androgel.   Most recent lab results (07/05/18) of CBC w/diff and CMP is as follows: all values are WNL except for RBC at 3.91, HGB at 12.7, HCT at 37.8, and Glucose at 102. MMP 07/05/18 revealed all values WNL except for M Protein at 0.4. Kappa/Lambda 07/05/18 revealed all values WNL except for Kappa at 23.9 LDH 07/05/18 was WNL at 154  On review of systems, pt reports feeling tired, and denies pain along the spine, new bone pains, fevers, chills, night sweats and any other symptoms.   Interval History:   Keith Bernard returns today for management and evaluation of his MGUS. The patient's last visit with [place] was on 07/11/18. He is accompanied today by his wife. The pt reports that he is doing well overall.   The pt reports that he has not developed any new concerns in the interim. He notes that he has gained some weight in  the interim, has been eating very well, but has not been exercising very well. He is now about to begin PT for his left knee concerns. The pt notes that he has been a little tired recently which he attributes to deconditioning.   Lab results (01/11/19) of CBC w/diff and CMP is as follows: all values are WNL. 01/11/19 SFLC are WNL 01/11/19 MMP revealed all values WNL except for M Protein at 0.3g  On review of systems, pt reports eating well, feeling a little tired, weight gain, and denies new bone pains, fevers, chills, and any other symptoms.   MEDICAL HISTORY:  Past Medical History:  Diagnosis Date  . Asthma   . Chronic diastolic CHF (congestive heart failure) ([place])   . Chronic kidney disease (CKD), stage III (moderate) ([facility])   . Chronic pain   . 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 Depression 05/04/2016  . DJD (degenerative joint disease)   . Dyslipidemia   . ETOH abuse   . Foot ulcer ([place]) 07/07/2017  . GERD (gastroesophageal reflux disease)   . Hypertension   . Neuropathy   . Obesity   . OSA (obstructive sleep apnea)    Moderate OSA w AHI  17.7/hr now on VPAP at EEP at 7cm H2O, min PS 3cm H2O,max PS 15cm H2O  . RBBB     SURGICAL HISTORY: Past Surgical History:  Procedure Laterality Date  . CORONARY STENT PLACEMENT    . REPLACEMENT TOTAL KNEE     left knee    SOCIAL HISTORY: Social History   Socioeconomic History  . Marital status: Married    Spouse name: Not on file  . Number of children: Not on file  . Years of education: Not on file  . Highest education level: Not on file  Occupational History  . Occupation: Risk manager  Social Needs  . Financial resource strain: Not on file  . Food insecurity:    Worry: Not on file    Inability: Not on file  . Transportation needs:    Medical: Not on file     Non-medical: Not on file  Tobacco Use  . Smoking status: Never Smoker  . Smokeless tobacco: Never Used  Substance and Sexual Activity  . Alcohol use: Yes    Alcohol/week: 0.0 standard drinks    Comment: Moderate, beer  . Drug use: No  . Sexual activity: Not on file  Lifestyle  . Physical activity:    Days per week: Not on file    Minutes per session: Not on file  . Stress: Not on file  Relationships  . Social connections:    Talks on phone: Not on file    Gets together: Not on file    Attends religious service: Not on file    Active member of club or organization: Not on file    Attends meetings of clubs or organizations: Not on file    Relationship status: Not on file  . Intimate partner violence:    Fear of current or ex partner: Not on file    Emotionally abused: Not on file    Physically abused: Not on file    Forced sexual activity: Not on file  Other Topics Concern  . Not on file  Social History Narrative  . Not on file    FAMILY HISTORY: Family History  Problem Relation Age of Onset  . COPD Mother   . Pancreatic cancer Father        pancreatic   . Other Cousin        Amyloidosis    ALLERGIES:  has No Known Allergies.  MEDICATIONS:  Current Outpatient Medications  Medication Sig Dispense Refill  . albuterol (PROAIR HFA) 108 (90 Base) MCG/ACT inhaler Inhale 2 puffs into the lungs every 6 (six) hours as needed for wheezing or shortness of breath. 1 Inhaler 5  . albuterol (PROVENTIL) (2.5 MG/3ML) 0.083% nebulizer solution Take 3 mLs (2.5 mg total) by nebulization every 6 (six) hours as needed for wheezing or shortness of breath. 75 mL 5  . aspirin 81 MG tablet Take 81 mg by mouth daily.      Marland Kitchen atorvastatin (LIPITOR) 20 MG tablet Take 20 mg by mouth daily.    Marland Kitchen CALCIUM PO Take by mouth.    . Cholecalciferol (VITAMIN D3) 1000 units CAPS Take 1,000 Units by mouth.    . CO ENZYME Q-10 PO Take by mouth.    . doxycycline (VIBRA-TABS) 100 MG tablet Take 1 tablet  (100 mg total) by mouth 2 (two) times daily. 10 tablet 0  . escitalopram (LEXAPRO) 20 MG tablet Take 20 mg by mouth daily.    Marland Kitchen esomeprazole (NEXIUM) 40 MG capsule Take 40 mg by  mouth daily before breakfast.      . fenofibrate 160 MG tablet TAKE 1 TABLET BY MOUTH  DAILY WITH FOOD 90 tablet 2  . furosemide (LASIX) 20 MG tablet Take 1 tablet (20 mg total) by mouth every other day. 45 tablet 3  . LORazepam (ATIVAN) 1 MG tablet Take 1 mg by mouth every 8 (eight) hours.    . Magnesium 250 MG TABS Take 1 tablet by mouth daily.    . metoprolol tartrate (LOPRESSOR) 25 MG tablet Take 1 tablet (25 mg total) by mouth 2 (two) times daily. 180 tablet 2  . Misc Natural Products (COLON CLEANSE PO) Take by mouth.    . mometasone-formoterol (DULERA) 100-5 MCG/ACT AERO Inhale 2 puffs into the lungs 2 (two) times daily. 1 Inhaler 0  . montelukast (SINGULAIR) 10 MG tablet TAKE 1 TABLET BY MOUTH AT  BEDTIME 90 tablet 3  . nitroGLYCERIN (NITROSTAT) 0.4 MG SL tablet Place 1 tablet (0.4 mg total) under the tongue every 5 (five) minutes as needed. 25 tablet 3  . oxyCODONE (ROXICODONE) 15 MG immediate release tablet Take 15 mg by mouth every 4 (four) hours as needed.      Marland Kitchen Spacer/Aero-Holding Chambers (AEROCHAMBER MV) inhaler Use as instructed 1 each 0  . tamsulosin (FLOMAX) 0.4 MG CAPS capsule Take 0.4 mg by mouth daily.     . vitamin B-12 (CYANOCOBALAMIN) 1000 MCG tablet Take 1,000 mcg by mouth daily.    Marland Kitchen zolpidem (AMBIEN) 10 MG tablet Take 10 mg by mouth at bedtime as needed for sleep.      No current facility-administered medications for this visit.     REVIEW OF SYSTEMS:    A 10+ POINT REVIEW OF SYSTEMS WAS OBTAINED including neurology, dermatology, psychiatry, cardiac, respiratory, lymph, extremities, GI, GU, Musculoskeletal, constitutional, breasts, reproductive, HEENT.  All pertinent positives are noted in the HPI.  All others are negative.   PHYSICAL EXAMINATION:  . Vitals:   01/18/19 1300  BP: 122/63   Pulse: 68  Resp: 20  Temp: 98.6 F (37 C)  SpO2: 93%   Filed Weights   01/18/19 1300  Weight: 276 lb 4.8 oz (125.3 kg)   .Body mass index is 40.8 kg/m.  GENERAL:alert, in no acute distress and comfortable SKIN: no acute rashes, no significant lesions EYES: conjunctiva are pink and non-injected, sclera anicteric OROPHARYNX: MMM, no exudates, no oropharyngeal erythema or ulceration NECK: supple, no JVD LYMPH:  no palpable lymphadenopathy in the cervical, axillary or inguinal regions LUNGS: clear to auscultation b/l with normal respiratory effort HEART: regular rate & rhythm ABDOMEN:  normoactive bowel sounds , non tender, not distended. No palpable hepatosplenomegaly.  Extremity: no pedal edema PSYCH: alert & oriented x 3 with fluent speech NEURO: no focal motor/sensory deficits   LABORATORY DATA:  I have reviewed the data as listed  . CBC Latest Ref Rng & Units 01/11/2019 07/05/2018 03/29/2018  WBC 4.0 - 10.5 K/uL 6.7 4.3 5.9  Hemoglobin 13.0 - 17.0 g/dL 13.1 12.7(L) 13.7  Hematocrit 39.0 - 52.0 % 40.8 37.8(L) 41.8  Platelets 150 - 400 K/uL 211 178 209    . CMP Latest Ref Rng & Units 01/11/2019 10/17/2018 09/27/2018  Glucose 70 - 99 mg/dL 93 100(H) 102(H)  BUN 8 - 23 mg/dL 19 17 11   Creatinine 0.61 - 1.24 mg/dL 0.94 0.96 0.88  Sodium 135 - 145 mmol/L 138 141 137  Potassium 3.5 - 5.1 mmol/L 4.9 4.3 4.0  Chloride 98 - 111 mmol/L 101 93(L) 100  CO2 22 - 32 mmol/L 29 24 30   Calcium 8.9 - 10.3 mg/dL 9.2 9.1 9.5  Total Protein 6.5 - 8.1 g/dL 7.3 - -  Total Bilirubin 0.3 - 1.2 mg/dL 0.8 - -  Alkaline Phos 38 - 126 U/L 70 - -  AST 15 - 41 U/L 18 - -  ALT 0 - 44 U/L 12 - -      RADIOGRAPHIC STUDIES: I have personally reviewed the radiological images as listed and agreed with the findings in the report. No results found.  ASSESSMENT & PLAN:   73 y.o. male with   1. IgG Lambda MGUS Labs upon initial presentation from 07/05/18, HGB at 12.7, M Protein at 0.4, Creatinine  normal at 0.94, Calcium normal at 9.0 10/28/17 Bone Survey revealed Negative for lytic or sclerotic lesion. Degenerative change throughout the spine is worst in the cervical and lumbar spine. Atherosclerosis  PLAN: -Discussed pt labwork from 01/11/19; blood counts and chemistries are normal. SFLC are normal. Monoclonal Protein of IgG Lambda decreased to 0.3g -Discussed the CRAB criteria with the pt and his wife: pt has normal calcium levels, normal renal functions, no anemia, and no concerns of bone tumors -Discussed that because the pt remains low risk, lacks constitutional symptoms, and has been clinically stable. Will now space out follow ups to once a year. -No indication to begin considering active treatment -No current indication to order BM Bx, will consider this if CRAB criteria or clinical symptoms become indicative of such -Will see the pt back in one year   RTC with Dr Irene Limbo with labs in 12 months with labs (plz schedule lab 1 week prior to clinic visit)   All of the patients questions were answered with apparent satisfaction. The patient knows to call the clinic with any problems, questions or concerns.  The total time spent in the appt was 20 minutes and more than 50% was on counseling and direct patient cares.    Sullivan Lone MD MS AAHIVMS [facility] Hematology/Oncology Physician [facility]  (Office):       [phone] (Work cell):  [phone] (Fax):           [phone]  01/18/2019 1:15 PM  I, Baldwin [place], am acting as a scribe for Dr. Sullivan Lone.   .I have reviewed the above documentation for accuracy and completeness, and I agree with the above. Brunetta Genera MD

## 2019-01-18 ENCOUNTER — Telehealth: Payer: Self-pay | Admitting: Hematology

## 2019-01-18 ENCOUNTER — Inpatient Hospital Stay (HOSPITAL_BASED_OUTPATIENT_CLINIC_OR_DEPARTMENT_OTHER): Payer: Medicare Other | Admitting: Hematology

## 2019-01-18 VITALS — BP 122/63 | HR 68 | Temp 98.6°F | Resp 20 | Ht 69.0 in | Wt 276.3 lb

## 2019-01-18 DIAGNOSIS — D472 Monoclonal gammopathy: Secondary | ICD-10-CM

## 2019-01-18 DIAGNOSIS — G4733 Obstructive sleep apnea (adult) (pediatric): Secondary | ICD-10-CM

## 2019-01-18 DIAGNOSIS — R5383 Other fatigue: Secondary | ICD-10-CM

## 2019-01-18 DIAGNOSIS — I2510 Atherosclerotic heart disease of native coronary artery without angina pectoris: Secondary | ICD-10-CM

## 2019-01-18 DIAGNOSIS — I5032 Chronic diastolic (congestive) heart failure: Secondary | ICD-10-CM | POA: Diagnosis not present

## 2019-01-18 DIAGNOSIS — E785 Hyperlipidemia, unspecified: Secondary | ICD-10-CM

## 2019-01-18 DIAGNOSIS — Z79899 Other long term (current) drug therapy: Secondary | ICD-10-CM

## 2019-01-18 DIAGNOSIS — Z7982 Long term (current) use of aspirin: Secondary | ICD-10-CM

## 2019-01-18 NOTE — Telephone Encounter (Signed)
Scheduled appt per 02/19 los.

## 2019-02-03 ENCOUNTER — Telehealth: Payer: Self-pay | Admitting: Internal Medicine

## 2019-02-03 NOTE — Telephone Encounter (Signed)
Called and spoke with patient he wants an alternative for the singulair. The CDC has stated that singulair now has a warning for depression. Patient already has depression and he does not want it to get worse.   MR please advise, thank you.

## 2019-02-03 NOTE — Telephone Encounter (Signed)
Called patient x2. Unable to reach. Phone kept picking up and hanging up.

## 2019-02-03 NOTE — Telephone Encounter (Signed)
Called patient, unable to reach left message to give us a call back. 

## 2019-02-03 NOTE — Telephone Encounter (Signed)
Patient returning call.

## 2019-02-03 NOTE — Telephone Encounter (Signed)
There is no substitute. Maybe he can stop it and see how he does with resp symptoms and mood. Give a good 4 week wash out. ANy issues hhe can call us

## 2019-02-06 NOTE — Telephone Encounter (Signed)
Spoke with Jocelyn Lamer and advised her of MR message. She understood and will relay the message to pt. Nothing further is needed.

## 2019-02-07 ENCOUNTER — Telehealth: Payer: Self-pay | Admitting: Internal Medicine

## 2019-02-07 MED ORDER — ALBUTEROL SULFATE HFA 108 (90 BASE) MCG/ACT IN AERS: 2.0000 | INHALATION_SPRAY | Freq: Four times a day (QID) | RESPIRATORY_TRACT | 5 refills | Status: DC | PRN

## 2019-02-07 MED ORDER — MOMETASONE FURO-FORMOTEROL FUM 100-5 MCG/ACT IN AERO: 2.0000 | INHALATION_SPRAY | Freq: Two times a day (BID) | RESPIRATORY_TRACT | 5 refills | Status: DC

## 2019-02-07 NOTE — Telephone Encounter (Signed)
Refill of both Dulera and Proair have been sent to pt's preferred pharmacy. Attempted to contact pt or wife Vickie to let them know this had been done but unable to reach them. Left detailed message on machine letting them know the refills were sent in. Nothing further needed.

## 2019-02-14 ENCOUNTER — Telehealth: Payer: Self-pay | Admitting: Internal Medicine

## 2019-02-14 MED ORDER — DOXYCYCLINE HYCLATE 100 MG PO TABS: 100.0000 mg | ORAL_TABLET | Freq: Two times a day (BID) | ORAL | 0 refills | Status: DC

## 2019-02-14 MED ORDER — PREDNISONE 10 MG PO TABS: ORAL_TABLET | ORAL | 0 refills | Status: DC

## 2019-02-14 NOTE — Telephone Encounter (Signed)
AECOPD + ? ?Plan ? - Take doxycycline 100mg po twice daily x 5 days; take after meals and avoid sunlight ?- Please take prednisone 40 mg x1 day, then 30 mg x1 day, then 20 mg x1 day, then 10 mg x1 day, and then 5 mg x1 day and stop ? ? ? ?No Known Allergies ? ?

## 2019-02-14 NOTE — Telephone Encounter (Signed)
Called and spoke with Patient. Patient stated he is having increase wheezing.  Patient is having a productive cough, with clear to light yellow phlegm.  Patient states it is worse at night. Patient is feeling fatigue, and having sweats.  Patient denies fever, travel, or being in contact with anyone sick. Patient is using his proair, singulair, dulera, mucinex as prescribed.  Patient is requesting prednisone and antibiotic for his cough, and wheezing. Patient requested any prescriptions to go to [facility] in [place].  Message routed to MR

## 2019-02-14 NOTE — Telephone Encounter (Signed)
Called and spoke with patient he is aware and verbalized understanding. Nothing further needed. Order sent.

## 2019-03-15 ENCOUNTER — Telehealth: Payer: Self-pay | Admitting: *Deleted

## 2019-03-15 NOTE — Telephone Encounter (Signed)
Called patient to ask him to take his chip to his dme for a download because he has an old  s9 VPAP unit. Maryfrances Bunnell shows the patient is noncompliant and the last update is on 10/02/2018.lmtcb

## 2019-03-15 NOTE — Telephone Encounter (Signed)
-----   Message from Freada Bergeron, [place] sent at 10/03/2018  6:01 PM EST ----- Regarding: download 4 week download =dec 2

## 2019-03-16 ENCOUNTER — Telehealth: Payer: Self-pay | Admitting: Cardiology

## 2019-03-16 NOTE — Telephone Encounter (Signed)
Patient called about the chip to his BiPAP machine. Please call him, he would like to speak to you.

## 2019-03-17 NOTE — Telephone Encounter (Signed)
Returned call to patient and he states he is not able to bring his chip in for a download at this time.because covid 19.

## 2019-03-26 ENCOUNTER — Other Ambulatory Visit: Payer: Self-pay | Admitting: Cardiology

## 2019-03-26 ENCOUNTER — Other Ambulatory Visit: Payer: Self-pay | Admitting: Internal Medicine

## 2019-04-12 NOTE — Telephone Encounter (Signed)
Patient will bring his chip on Friday 04/14/19 at 1 pm so I can download it.

## 2019-04-16 NOTE — Progress Notes (Signed)
Virtual Visit via Telephone Note   This visit type was conducted due to national recommendations for restrictions regarding the COVID-19 Pandemic (e.g. social distancing) in an effort to limit this patient's exposure and mitigate transmission in our community.  Due to his co-morbid illnesses, this patient is at least at moderate risk for complications without adequate follow up.  This format is felt to be most appropriate for this patient at this time.  All issues noted in this document were discussed and addressed.  A limited physical exam was performed with this format.  Please refer to the patient's chart for his consent to telehealth for [facility].   Evaluation Performed:  Follow-up visit  This visit type was conducted due to national recommendations for restrictions regarding the COVID-19 Pandemic (e.g. social distancing).  This format is felt to be most appropriate for this patient at this time.  All issues noted in this document were discussed and addressed.  No physical exam was performed (except for noted visual exam findings with Video Visits).  Please refer to the patient's chart (MyChart message for video visits and phone note for telephone visits) for the patient's consent to telehealth for [facility].  Date:  04/17/2019   ID:  Keith Bernard, DOB 1946-02-01, MRN [id number]  Patient Location:  Home  Provider location:   [place]  PCP:  [name], MD  Cardiologist: Fransico Him, MD Electrophysiologist:  None   Chief Complaint:  OSA, CAD, DCM  History of Present Illness:    Keith Bernard is a 73 y.o. male who presents via audio/video conferencing for a telehealth visit today.    Keith Bernard is a 73 y.o. 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 He has chronic LE edema from his neuropathy but this is stable and well controlled on diuretics.He has moderate obstructive sleep apnea with an AHI of 17.7/h and now on auto BiPAP.  He is here today for followup and is doing well.  He denies any chest pain or pressure, SOB, DOE, PND, orthopnea, LE edema, dizziness, palpitations or syncope.  He is compliant with his meds and is tolerating meds with no SE. He is doing well with his CPAP device and thinks that he has gotten used to it.  He tolerates the mask and feels the pressure is adequate.  Since going on CPAP he feels rested in the am and has no significant daytime sleepiness.  He denies any significant mouth or nasal dryness or nasal congestion.  He does not think that he snores.    The patient does not have symptoms concerning for COVID-19 infection (fever, chills, cough, or new shortness of breath).   Prior CV studies:   The following studies were reviewed today:  Pap download for compliance  Past Medical History:  Diagnosis Date  . Asthma   . Chronic diastolic CHF (congestive heart failure) ([place])   . Chronic kidney disease (CKD), stage III (moderate) ([facility])   . Chronic pain   . Coronary artery disease    S/P STEMI anterior with PCI w BMS of LAD 04/12/2009 20% Prox RCA, 30 % mid RCA 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 EF 35% but now by  echo ER 55% 04/2009  . Depression 05/04/2016  . DJD (degenerative joint disease)   . Dyslipidemia   . ETOH abuse   . Foot ulcer ([place]) 07/07/2017  . GERD (gastroesophageal reflux disease)   . Hypertension   . Neuropathy   . Obesity   . OSA (obstructive sleep apnea)    Moderate OSA w AHI 17.7/hr now on VPAP at EEP at 7cm H2O, min PS 3cm H2O,max PS 15cm H2O  . RBBB    Past Surgical History:  Procedure Laterality Date  . CORONARY STENT PLACEMENT    .  REPLACEMENT TOTAL KNEE     left knee     Current Meds  Medication Sig  . albuterol (PROAIR HFA) 108 (90 Base) MCG/ACT inhaler Inhale 2 puffs into the lungs every 6 (six) hours as needed for wheezing or shortness of breath.  Marland Kitchen albuterol (PROVENTIL) (2.5 MG/3ML) 0.083% nebulizer solution Take 3 mLs (2.5 mg total) by nebulization every 6 (six) hours as needed for wheezing or shortness of breath.  Marland Kitchen aspirin 81 MG tablet Take 81 mg by mouth daily.    Marland Kitchen atorvastatin (LIPITOR) 20 MG tablet Take 20 mg by mouth daily.  Marland Kitchen CALCIUM PO Take by mouth.  . Cholecalciferol (VITAMIN D3) 1000 units CAPS Take 1,000 Units by mouth.  . CO ENZYME Q-10 PO Take by mouth.  . escitalopram (LEXAPRO) 20 MG tablet Take 20 mg by mouth daily.  Marland Kitchen esomeprazole (NEXIUM) 40 MG capsule Take 40 mg by mouth daily before breakfast.    . fenofibrate 160 MG tablet TAKE 1 TABLET BY MOUTH  DAILY WITH FOOD  . furosemide (LASIX) 20 MG tablet Take 1 tablet (20 mg total) by mouth every other day.  Marland Kitchen LORazepam (ATIVAN) 1 MG tablet Take 1 mg by mouth every 8 (eight) hours.  . Magnesium 250 MG TABS Take 1 tablet by mouth daily.  . metoprolol tartrate (LOPRESSOR) 25 MG tablet TAKE 1 TABLET BY MOUTH TWO  TIMES DAILY  . Misc Natural Products (COLON CLEANSE PO) Take by mouth.  . mometasone-formoterol (DULERA) 100-5 MCG/ACT AERO Inhale 2 puffs into the lungs 2 (two) times daily.  . montelukast (SINGULAIR) 10 MG tablet TAKE 1 TABLET BY MOUTH AT  BEDTIME  . nitroGLYCERIN (NITROSTAT) 0.4 MG SL tablet Place 1 tablet (0.4 mg total) under the tongue every 5 (five) minutes as needed.  Marland Kitchen oxyCODONE (ROXICODONE) 15 MG immediate release tablet Take 15 mg by mouth every 4 (four) hours as needed.    Marland Kitchen Spacer/Aero-Holding Chambers (AEROCHAMBER MV) inhaler Use as instructed  . tamsulosin (FLOMAX) 0.4 MG CAPS capsule Take 0.4 mg by mouth daily.   . vitamin B-12 (CYANOCOBALAMIN) 1000 MCG tablet Take 1,000 mcg by mouth daily.  Marland Kitchen zolpidem (AMBIEN) 10 MG tablet  Take 10 mg by mouth at bedtime as needed for sleep.      Allergies:   Patient has no known allergies.   Social History   Tobacco Use  . Smoking status: Never Smoker  . Smokeless tobacco: Never Used  Substance Use Topics  . Alcohol use: Yes    Alcohol/week: 0.0 standard drinks    Comment: Moderate, beer  . Drug use: No     Family Hx: The patient's family history includes COPD in his mother; Other in his cousin; Pancreatic cancer in his father.  ROS:   Please see the history of present illness.     All other systems reviewed and are negative.   Labs/Other Tests and Data Reviewed:  Recent Labs: 10/17/2018: NT-Pro BNP 367 01/11/2019: ALT 12; BUN 19; Creatinine 0.94; Hemoglobin 13.1; Platelets 211; Potassium 4.9; Sodium 138   Recent Lipid Panel Lab Results  Component Value Date/Time   CHOL 122 03/15/2018 03:03 PM   TRIG 55 03/15/2018 03:03 PM   HDL 62 03/15/2018 03:03 PM   CHOLHDL 2.0 03/15/2018 03:03 PM   CHOLHDL 1.9 05/04/2016 10:29 AM   LDLCALC 49 03/15/2018 03:03 PM   LDLDIRECT 33.0 05/07/2015 12:33 PM    Wt Readings from Last 3 Encounters:  04/17/19 266 lb (120.7 kg)  01/18/19 276 lb 4.8 oz (125.3 kg)  11/11/18 276 lb (125.2 kg)     Objective:    Vital Signs:  BP (!) 143/85   Pulse 72   Ht 5\' 9"  (1.753 m)   Wt 266 lb (120.7 kg)   SpO2 93%   BMI 39.28 kg/m    CONSTITUTIONAL:  Well nourished, well developed male in no acute distress.  EYES: anicteric MOUTH: oral mucosa is pink RESPIRATORY: Normal respiratory effort, symmetric expansion CARDIOVASCULAR: No peripheral edema SKIN: No rash, lesions or ulcers MUSCULOSKELETAL: no digital cyanosis NEURO: Cranial Nerves II-XII grossly intact, moves all extremities PSYCH: Intact judgement and insight.  A&O x 3, Mood/affect appropriate   ASSESSMENT & PLAN:    1.  ASCHD -status post anterior MI with PCI with bare-metal stent of LAD on 04/12/2009.  Cath at that time also showed nonobstructive disease with 20%  proximal RCA and 30% mid RCA.  He has not had any anginal symptoms.  He will continue on aspirin 81 mg daily, atorvastatin 20 mg daily and Lopressor 25 mg twice daily.  2.  Hypertension -his blood pressures controlled today.  We will continue on Lopressor 25 mg twice daily  3.  Dilated cardiomyopathy -this is felt to be mixed ischemic/nonischemic.  His EF normalized by echo in 2000 1050 to 55% and then 71% by stress Myoview on 2013.  His most recent echo 10/19/2018 showed normal LV function with EF 55 to 60% with grade 2 diastolic dysfunction.  He will continue on beta-blocker therapy.  4.  Hyperlipidemia -his LDL goal is less than 70.  His LDL year ago was 49.  I will repeat an FLP and ALT.  He will continue on the fenofibrate 160 mg daily and atorvastatin 20 mg daily.  5.  OSA - the patient is tolerating PAP therapy well without any problems. I will get a download from his DME. The patient has been using and benefiting from PAP use and will continue to benefit from therapy.   6.  Chronic diastolic CHF -he has not had any shortness of breath is stable. He has chronic Right pedal edema due to neuropathy.   His weight has been stable and actually down 11lbs from 09/2018.  Last 2D echocardiogram done last fall showed normal LV function.  He will continue on Lasix 20 mg every other day.  His creatinine was stable at 0.94 and K+ 4.9 on 01/11/2019.  COVID-19 Education: The signs and symptoms of COVID-19 were discussed with the patient and how to seek care for testing (follow up with PCP or arrange E-visit).  The importance of social distancing was discussed today.  Patient Risk:   After full review of this patient's clinical status, I feel that they are at least moderate risk at this time.  Time:   Today, I have spent 25 minutes directly with the patient on telephone discussing medical problems including CAD, OSA, HTN, lipids, CHF.  We also reviewed the symptoms of COVID 19 and the ways to protect  against contracting the virus with telehealth technology.  I spent an additional 5 minutes reviewing patient's chart including labs.  Medication Adjustments/Labs and Tests Ordered: Current medicines are reviewed at length with the patient today.  Concerns regarding medicines are outlined above.  Tests Ordered: No orders of the defined types were placed in this encounter.  Medication Changes: No orders of the defined types were placed in this encounter.   Disposition:  Follow up in 6 month(s)  Signed, Fransico Him, MD  04/17/2019 1:18 PM    [place]

## 2019-04-17 ENCOUNTER — Encounter: Payer: Self-pay | Admitting: Cardiology

## 2019-04-17 ENCOUNTER — Telehealth (INDEPENDENT_AMBULATORY_CARE_PROVIDER_SITE_OTHER): Payer: Medicare Other | Admitting: Cardiology

## 2019-04-17 ENCOUNTER — Other Ambulatory Visit: Payer: Self-pay

## 2019-04-17 VITALS — BP 143/85 | HR 72 | Ht 69.0 in | Wt 266.0 lb

## 2019-04-17 DIAGNOSIS — I10 Essential (primary) hypertension: Secondary | ICD-10-CM

## 2019-04-17 DIAGNOSIS — I420 Dilated cardiomyopathy: Secondary | ICD-10-CM

## 2019-04-17 DIAGNOSIS — E785 Hyperlipidemia, unspecified: Secondary | ICD-10-CM

## 2019-04-17 DIAGNOSIS — I2510 Atherosclerotic heart disease of native coronary artery without angina pectoris: Secondary | ICD-10-CM | POA: Diagnosis not present

## 2019-04-17 DIAGNOSIS — I5032 Chronic diastolic (congestive) heart failure: Secondary | ICD-10-CM | POA: Diagnosis not present

## 2019-04-17 DIAGNOSIS — G4733 Obstructive sleep apnea (adult) (pediatric): Secondary | ICD-10-CM

## 2019-04-17 DIAGNOSIS — Z7189 Other specified counseling: Secondary | ICD-10-CM

## 2019-04-17 NOTE — Telephone Encounter (Signed)
I was unable to download patients chip so with the help of Nevin Bloodgood at [facility] patient was sent to leave his original chip to be downloaded at the retail store and get another chip to put in his cpap unit at home. I will call [employer] to see if they have the download today Monday 04/17/19.

## 2019-04-17 NOTE — Patient Instructions (Signed)
Medication Instructions:  Your physician recommends that you continue on your current medications as directed. Please refer to the Current Medication list given to you today.  If you need a refill on your cardiac medications before your next appointment, please call your pharmacy.   Lab work: Fasting Labs: Lipid, Liver and BMET on 05/03/19  If you have labs (blood work) drawn today and your tests are completely normal, you will receive your results only by: Marland Kitchen MyChart Message (if you have MyChart) OR . A paper copy in the mail If you have any lab test that is abnormal or we need to change your treatment, we will call you to review the results.  Testing/Procedures: None  Follow-Up: At [facility], you and your health needs are our priority.  As part of our continuing mission to provide you with exceptional heart care, we have created designated Provider Care Teams.  These Care Teams include your primary Cardiologist (physician) and Advanced Practice Providers (APPs -  Physician Assistants and Nurse Practitioners) who all work together to provide you with the care you need, when you need it. You will need a follow up appointment in 6 months.  Please call our office 2 months in advance to schedule this appointment.  You may see Dr. Radford Pax or one of the following Advanced Practice Providers on your designated Care Team:   [place], PA-C Melina Copa, PA-C . Ermalinda Barrios, PA-C

## 2019-04-17 NOTE — Addendum Note (Signed)
Addended by: Sarina Ill on: 04/17/2019 03:51 PM   Modules accepted: Orders

## 2019-04-28 ENCOUNTER — Telehealth: Payer: Self-pay | Admitting: *Deleted

## 2019-04-28 NOTE — Telephone Encounter (Signed)
Patient is aware and agreeable to AHI being elevated at 13.6. Patient does not detect a leak but his new supplies arrived to today and he will change everything out. Patient wears a nasal pillow.

## 2019-04-28 NOTE — Telephone Encounter (Signed)
-----   Message from [name], MD sent at 04/25/2019 10:31 PM EDT ----- AHI is elevated with significant mask leak - please find out if patient is detecting a mask leak and what mask he is wearin

## 2019-05-01 ENCOUNTER — Telehealth: Payer: Self-pay | Admitting: *Deleted

## 2019-05-01 NOTE — Telephone Encounter (Signed)
    COVID-19 Pre-Screening Questions:  . In the past 7 to 10 days have you had a cough,  shortness of breath, headache, congestion, fever (100 or greater) body aches, chills, sore throat, or sudden loss of taste or sense of smell? . Have you been around anyone with known Covid 19. . Have you been around anyone who is awaiting Covid 19 test results in the past 7 to 10 days? . Have you been around anyone who has been exposed to Covid 19, or has mentioned symptoms of Covid 19 within the past 7 to 10 days?  If you have any concerns/questions about symptoms patients report during screening (either on the phone or at threshold). Contact the provider seeing the patient or DOD for further guidance.  If neither are available contact a member of the leadership team.     Contacted patient via phone call. All Covid 19 questions were no. Patient has a mask. KB

## 2019-05-03 ENCOUNTER — Other Ambulatory Visit: Payer: Self-pay

## 2019-05-03 ENCOUNTER — Other Ambulatory Visit: Payer: Medicare Other | Admitting: *Deleted

## 2019-05-03 DIAGNOSIS — E785 Hyperlipidemia, unspecified: Secondary | ICD-10-CM

## 2019-05-03 DIAGNOSIS — I5032 Chronic diastolic (congestive) heart failure: Secondary | ICD-10-CM

## 2019-05-03 DIAGNOSIS — I2510 Atherosclerotic heart disease of native coronary artery without angina pectoris: Secondary | ICD-10-CM

## 2019-05-03 LAB — HEPATIC FUNCTION PANEL
ALT: 11 IU/L (ref 0–44)
AST: 16 IU/L (ref 0–40)
Albumin: 4.4 g/dL (ref 3.7–4.7)
Alkaline Phosphatase: 63 IU/L (ref 39–117)
Bilirubin Total: 0.5 mg/dL (ref 0.0–1.2)
Bilirubin, Direct: 0.26 mg/dL (ref 0.00–0.40)
Total Protein: 6.7 g/dL (ref 6.0–8.5)

## 2019-05-03 LAB — BASIC METABOLIC PANEL
BUN/Creatinine Ratio: 14 (ref 10–24)
BUN: 14 mg/dL (ref 8–27)
CO2: 27 mmol/L (ref 20–29)
Calcium: 9.6 mg/dL (ref 8.6–10.2)
Chloride: 94 mmol/L — ABNORMAL LOW (ref 96–106)
Creatinine, Ser: 1 mg/dL (ref 0.76–1.27)
GFR calc Af Amer: 86 mL/min/{1.73_m2} (ref >59)
GFR calc non Af Amer: 74 mL/min/{1.73_m2} (ref >59)
Glucose: 89 mg/dL (ref 65–99)
Potassium: 4.3 mmol/L (ref 3.5–5.2)
Sodium: 138 mmol/L (ref 134–144)

## 2019-05-03 LAB — LIPID PANEL
Chol/HDL Ratio: 2.1 ratio (ref 0.0–5.0)
Cholesterol, Total: 124 mg/dL (ref 100–199)
HDL: 59 mg/dL (ref >39)
LDL Calculated: 43 mg/dL (ref 0–99)
Triglycerides: 110 mg/dL (ref 0–149)
VLDL Cholesterol Cal: 22 mg/dL (ref 5–40)

## 2019-06-13 ENCOUNTER — Other Ambulatory Visit: Payer: Self-pay | Admitting: Cardiology

## 2019-06-13 ENCOUNTER — Other Ambulatory Visit: Payer: Self-pay | Admitting: Internal Medicine

## 2019-08-10 ENCOUNTER — Other Ambulatory Visit: Payer: Self-pay | Admitting: Internal Medicine

## 2019-08-18 ENCOUNTER — Telehealth: Payer: Self-pay | Admitting: Internal Medicine

## 2019-08-18 MED ORDER — BREO ELLIPTA 100-25 MCG/INH IN AEPB: 1.0000 | INHALATION_SPRAY | Freq: Every day | RESPIRATORY_TRACT | 0 refills | Status: DC

## 2019-08-18 NOTE — Telephone Encounter (Signed)
Called spoke with wife, she states patient is on breo100 and the refill was over $200 due to hitting his donut hole. Patient has the assistance paperwork is going to fill out and bring Monday when she picks up samples.   2 samples placed up\ front for patient  Nothing further needed at this time.

## 2019-08-19 IMAGING — DX DG CHEST 2V
2 series · 2 of 2 positions shown · non-contrast
Comparison: 04/15/2009

CLINICAL DATA: Coronary artery disease post stenting, hypertension,
moderate persistent asthma without complication

EXAM:
CHEST  2 VIEW

[chest pa]
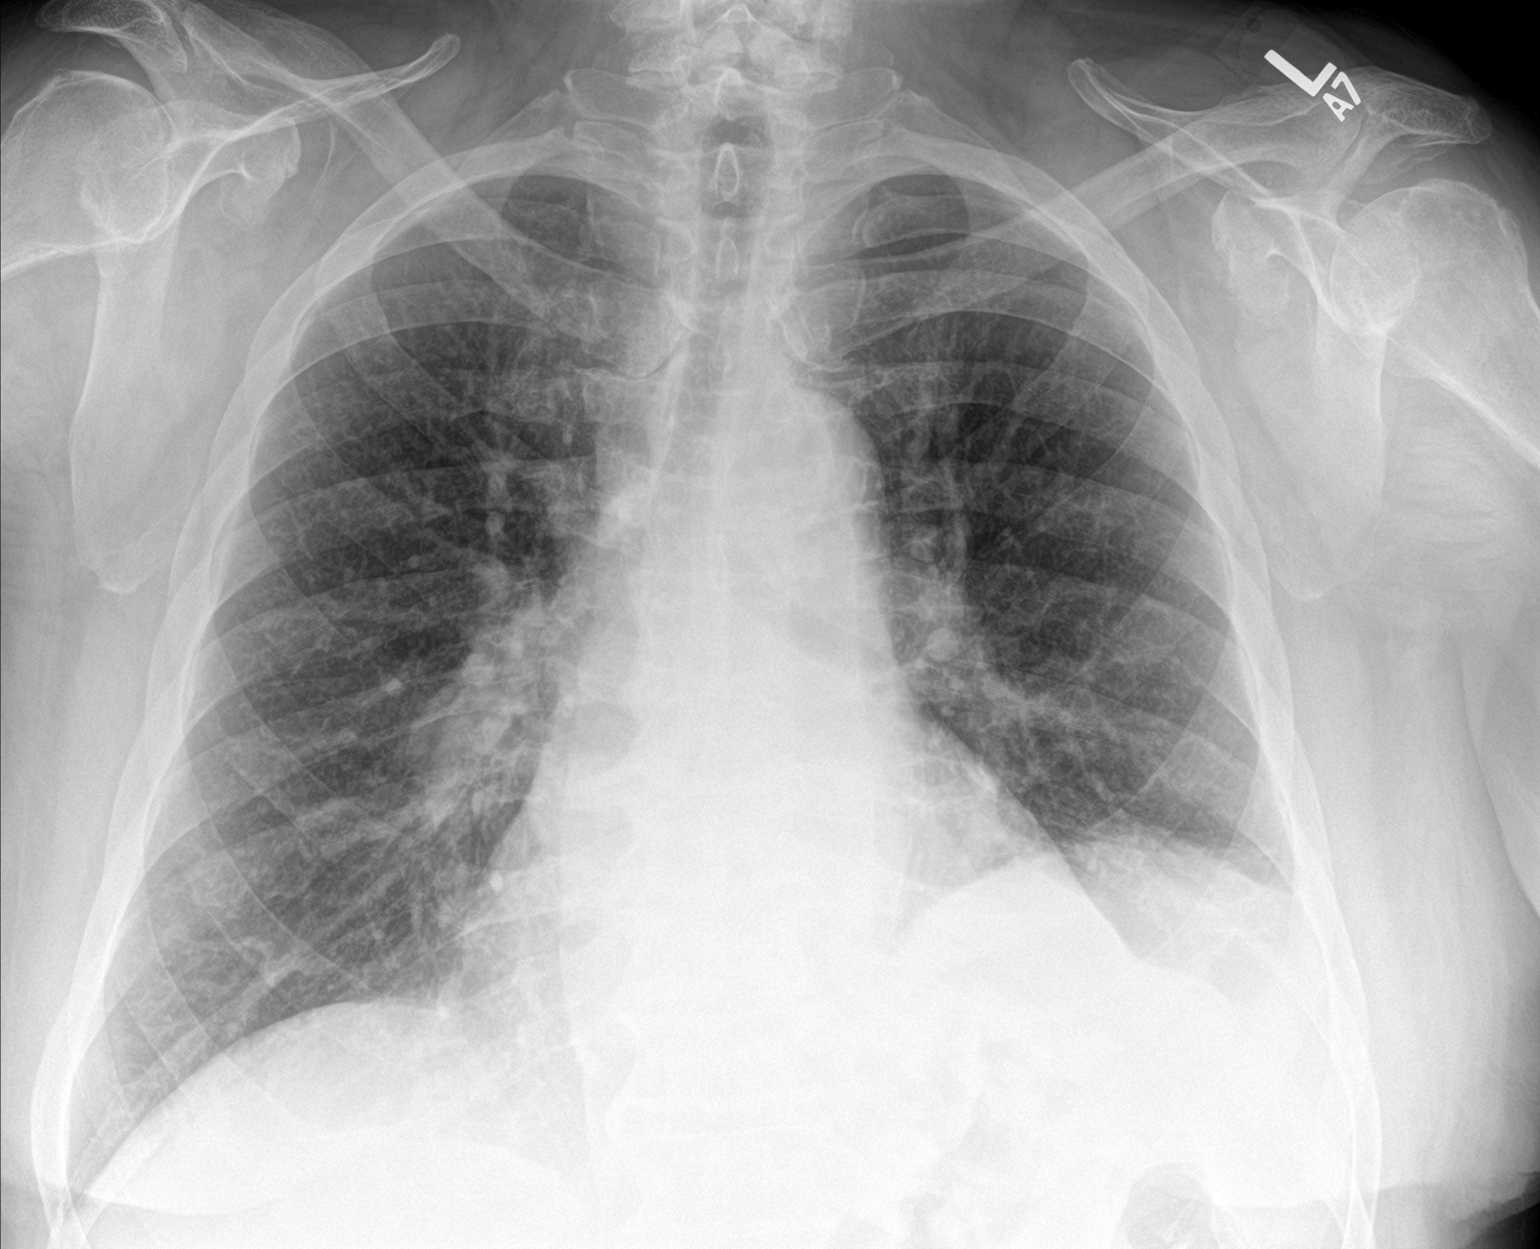

[chest lat]
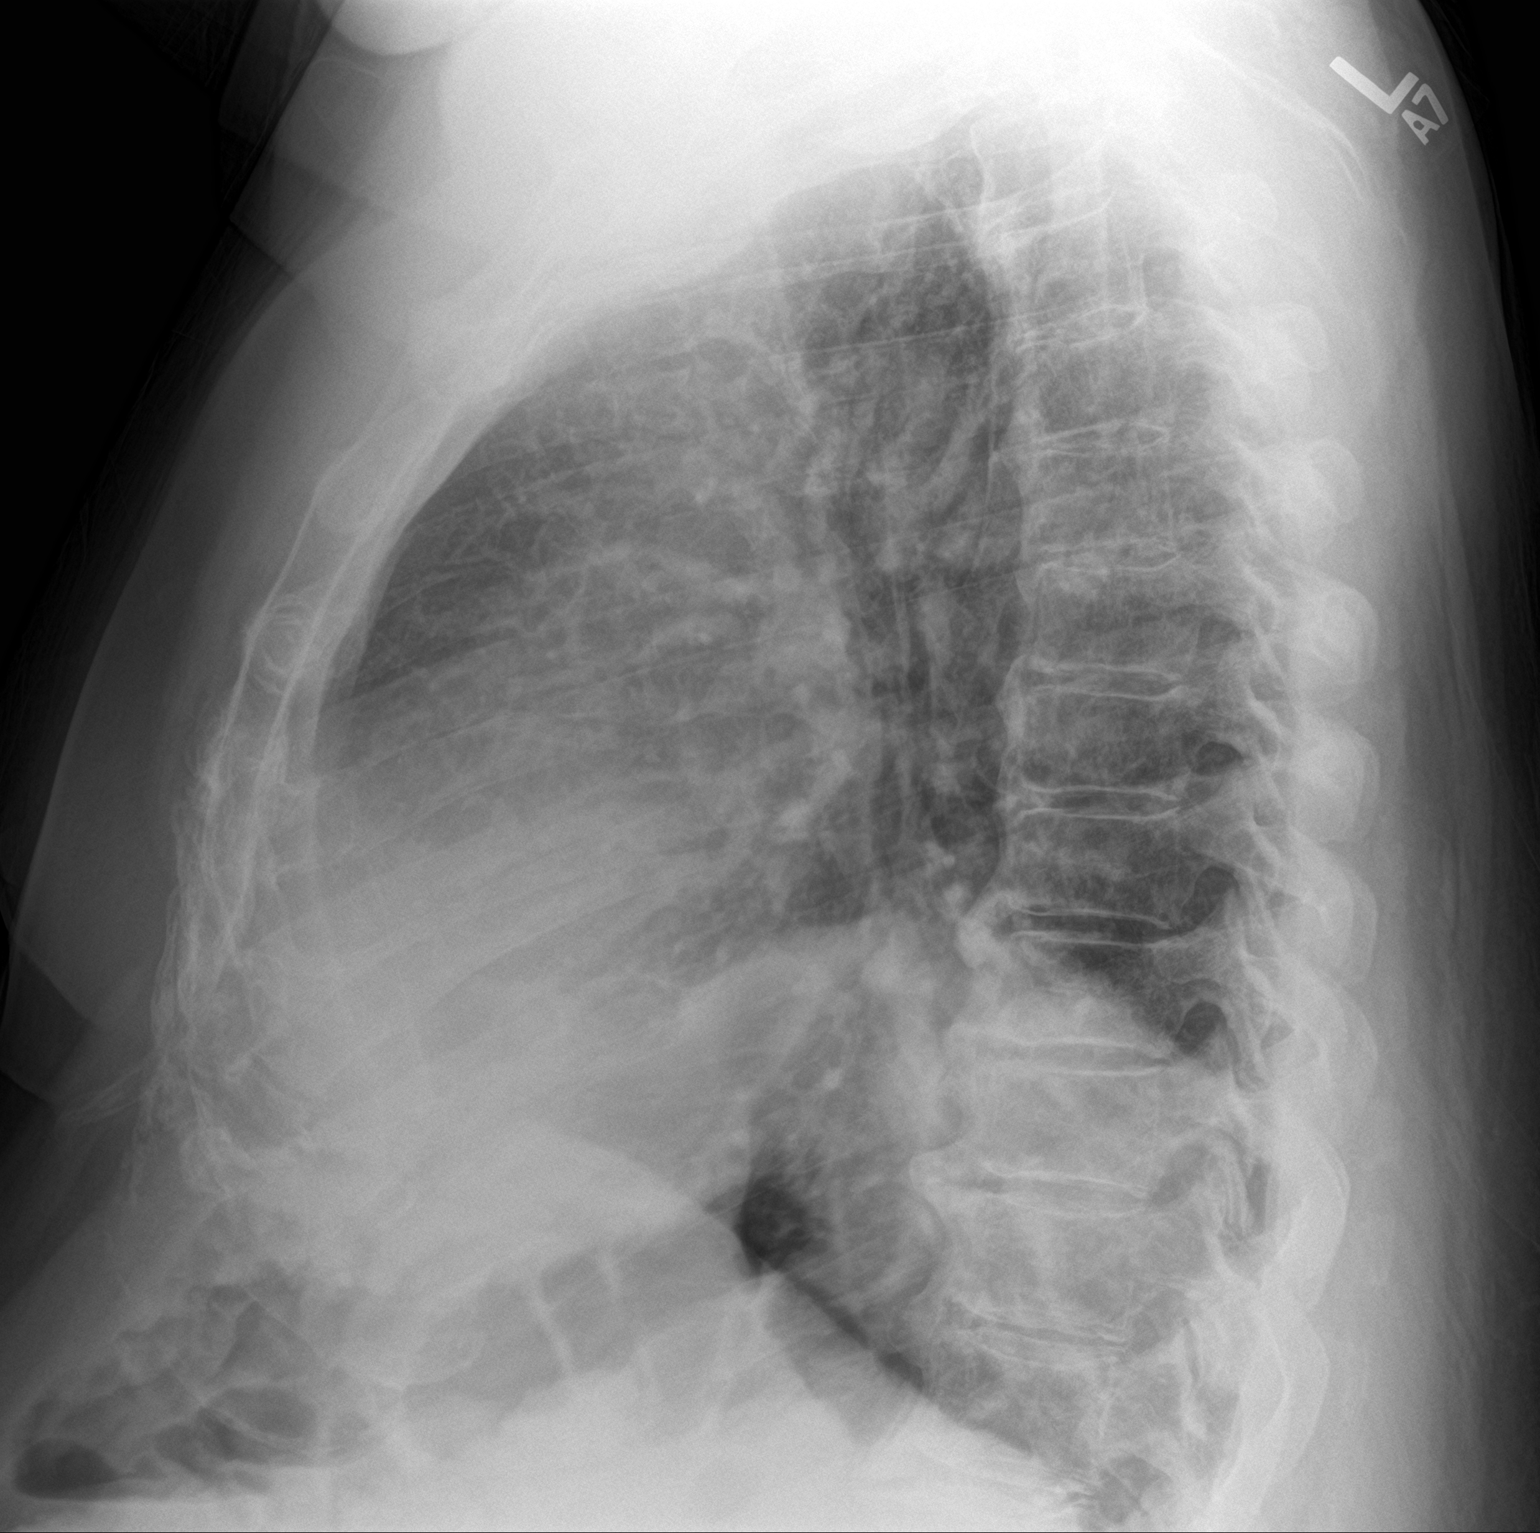

[2 of 2 positions shown; findings below may reference images not displayed]

FINDINGS: Upper normal heart size.

Normal mediastinal contours and pulmonary vascularity.

LEFT basilar atelectasis and elevation of LEFT diaphragm.

Lungs otherwise clear.

Minimal central peribronchial thickening.

No pleural effusion or pneumothorax.

Scattered degenerative disc disease changes thoracic spine.
IMPRESSION: Bronchitic changes with LEFT basilar atelectasis.

## 2019-09-07 ENCOUNTER — Other Ambulatory Visit: Payer: Self-pay | Admitting: Cardiology

## 2019-09-07 MED ORDER — FUROSEMIDE 20 MG PO TABS: 20.0000 mg | ORAL_TABLET | ORAL | 2 refills | Status: DC

## 2019-09-18 ENCOUNTER — Telehealth: Payer: Self-pay | Admitting: Internal Medicine

## 2019-09-18 NOTE — Telephone Encounter (Signed)
Spoke with Keith Bernard (EC). She stated that they will wait until after his appt tomorrow to get the refill on Singulair.   Nothing further needed at time of call.

## 2019-09-18 NOTE — Telephone Encounter (Signed)
Pt has a televisit 10/20 at 11:30.  [facility].  Pt needed refill of singulair.  Insurance doesn't cover singulair can do generic.  Please advise.  [phone].

## 2019-09-18 NOTE — Telephone Encounter (Signed)
Needs appt  LMTCB 

## 2019-09-19 ENCOUNTER — Other Ambulatory Visit: Payer: Self-pay

## 2019-09-19 ENCOUNTER — Ambulatory Visit (INDEPENDENT_AMBULATORY_CARE_PROVIDER_SITE_OTHER): Payer: Medicare Other | Admitting: Internal Medicine

## 2019-09-19 DIAGNOSIS — G4733 Obstructive sleep apnea (adult) (pediatric): Secondary | ICD-10-CM

## 2019-09-19 DIAGNOSIS — Z77120 Contact with and (suspected) exposure to mold (toxic): Secondary | ICD-10-CM | POA: Diagnosis not present

## 2019-09-19 DIAGNOSIS — J4540 Moderate persistent asthma, uncomplicated: Secondary | ICD-10-CM | POA: Diagnosis not present

## 2019-09-19 DIAGNOSIS — I2510 Atherosclerotic heart disease of native coronary artery without angina pectoris: Secondary | ICD-10-CM | POA: Diagnosis not present

## 2019-09-19 MED ORDER — MONTELUKAST SODIUM 10 MG PO TABS: 10.0000 mg | ORAL_TABLET | Freq: Every day | ORAL | 1 refills | Status: DC

## 2019-09-19 NOTE — Addendum Note (Signed)
Addended by: Jannette Spanner on: 09/19/2019 12:48 PM   Modules accepted: Orders

## 2019-09-19 NOTE — Progress Notes (Signed)
OV 01/29/2016  Chief Complaint  Patient presents with   Follow-up    Pt here after acute visit with SG on 2.10.17. Pt states his breathing has imrpoved since last OV. Pt states he has occassional wheezing and mild non prod cough. Pt denies CP/tightness.     Keith Bernard returns for chronic obstructive lung disease asthma follow-up. He is now on Dulera plus Singulair. He is compliant with Dulera. He says with improved compliance his symptoms are a lot better. His wife is here with him. Most recently he had a flareup and was treated with prednisone. I review the chart when he was seen by [name] gross my nurse practitioner. Currently he feels asthma is well controlled. However he still uses albuterol multiple times a day for rescue but denies any nocturnal symptoms or daytime symptoms. He is compliant with this CPAP for sleep apnea.  Review of the chart shows this is in December 2012 has elevated eos 400 cells per cubic millimeter. I notice that and IgE was never checked. In any event subjectively is well controlled currently. Also noted on alpha-1 has not been checked.  Asthma control questionnaire shows that at night he hardly ever wakes up because of asthma. When he wakes up he has very mild symptoms. In the daytime he is moderately limited because of asthma although this could be because of obesity. He also has moderate shortness of breath because of asthma although this could be because of obesity. He is wheezing a lot of the time and uses albuterol 3-4 puffs on most days. Nevertheless average score of 2.2 show significant improvement after starting Maupin 05/04/2016  Chief Complaint  Patient presents with   Follow-up    Pt c/o occasional wheeze since not being on Breo for about a week. Pt has been using albuterol HFA more. Pt denies cough/SOB/CP/tightness.    Follow-up asthma. Last seen March 2017. This is a routine follow-up. I put him on Breo when she really liked. However the  cost dollars out of pocket because he is in the donut hole. So he ran out of it over a week ago and is having increased symptoms. Asthma control questionnaire is 2.0 and still within his range from prior visits. Exhaled nitric oxide is just over 25 suggests some amount of active airway inflammation. His wife is here with him and she says that she got approved for the [facility] patient assistance program but apparently between our office in the mailroom the paperwork has been loss. She is willing to redo the paperwork and she is wondering if I could sign my part of it this week.  In terms of symptoms specifically tells me that he does wake up a few times at night. When he wakes up he has very mild symptoms. Slightly limited in his activities because of asthma. He has moderate amount of shortness of breath with exertion. He does wheeze a little of the time and is using albuterol for rescue at least one or 2 times daily. 5 point score is 2/.0 showing activity - is worse this past week or so is within normal   OV 01/04/2017   Chief Complaint  Patient presents with   Follow-up    Pt states last week he had an 'asthma attack' and went to ED in [place] and was given steroid and abx per pt's wife. Pt states he feels he is improving. Pt states he does not feel back to baseline. Pt denies CP/tightness  and f/c/s.     Follow-up moderate persistent asthma  Last seen June 2017. He presents with his wife as always. He is on Dulera through the assistance program. Some 3 weeks ago his sister died from terminal cancer. Take a flight to [place]. His been dealing with those emotions. Because of the travel, emotions and weather change some 10 days ago he had an asthma exacerbation but never below few days with increased cough and wheezing. He went to a local emergency department and got one shot of steroids and was given 7 days of cephalexin which is completed. Despite this is not fully better. He still has some cough  and wheeze and yellow sputum. There is no fever.   OV 07/07/2017  Chief Complaint  Patient presents with   Follow-up    Pt using Dulera, still having trouble getting this covered by insurance and getting Financial Assistance/Patient Assistance. Pt states that his breathing is doing better after senc round of Abx/Prednisone.      Follow-up moderate persistent asthma   Last seen February 2018. He presents with his wife. Overall he is doing stable except with the summer heat his symptoms are slightly more than usual as seen in the asthma control questionnaire. He says that he does not wake up at night because of asthma. When he wakes up he has mild symptoms. He has limitation in his activities because of asthma but this could also be because of obesity and DJD and spine issues. He does get dyspneic for moderate amount of the time and is wheezing a lot of the time but using albuterol for rescue 1-2 puffs per day. He's having significant difficulty getting [employer] to pay for his assistance program and is in need of samples today. He continues on Singulair.   OV 12/09/2017  Chief Complaint  Patient presents with   Follow-up    shortness of breath   Follow-up moderate persistent asthma  Routine 20-month follow-up.  He presents with his wife.  Overall he is doing fine.  He has significant symptoms on his asthma control questionnaire.  In fact his symptoms are slowly deteriorated.  Average score is 2.8.  But this resolved mostly shortness of breath.  He feels is due to obesity.  His nitric oxide is 12 showing good asthma control on Dulera and Singulair.  He plans to lose weight.  Recently had a fall and hurt the shoulder.  Review of the images show is not had a chest x-ray since 2010.  He is up-to-date with his flu shot.  In terms of his asthma control questionnaire he hardly ever wakes up at night.  When he wakes up he has moderate symptoms.  He is limited with his activities moderately and does  express a moderate amount of shortness of breath but he does wheeze occasionally.  He is using albuterol for rescue multiple times a day.  =  OV 08/05/2018  Subjective:  Patient ID: Keith Bernard, male , DOB: 1946-02-11 , age 61 y.o. , MRN: [id number] , ADDRESS: [place] [id number]   08/05/2018 -   Chief Complaint  Patient presents with   Follow-up    Pt has been sick and was put on prednisone and abx which he began Monday, 9/2.  Pt has c/o cough with green mucus, increased SOB. Denies any CP or chest tightness.     HPI REINER ROBICHEAU 73 y.o. -moderate persistent asthma patient here for follow-up. He is here with  his wife. They tell me that has been many months since he has taken his inhale corticosteroid Dulera because the patient assistance program has been slow to respond.He is relying on sampples. Apparently got some Symbicort from usat some point in the past since he last saw me. However he has run out of that as well. He is not sure how long it is since he ran out of that. So he is having a flareup. He is just dependent on prednisone for the last 5 days with antibiotic symptoms by our nurse practitioner.He does not feel fully back to baseline. His asthma control question is 3.2 showing significant nocturnal symptoms or albuterol rescue use OF breath and wheezing. In fact on exam he had some wheezing is exhaled nitric oxide was 28 while on prednisone       OV 11/11/2018  Subjective:  Patient ID: Keith Bernard, male , DOB: [birth year] , age 54 y.o. , MRN: [id number] , ADDRESS: [place] [id number]   11/11/2018 -   Chief Complaint  Patient presents with   Follow-up    f/u asthma. Pt states he has had a recent flare up, wheezing has increased.   Moderate persistent asthma for follow-up.  He is on [place] and Singulair  HPI TANNOR PRETORIUS 73 y.o. -for the last 3 days he is reporting increased cough with congestion and some  mild increase in wheezing with green sputum.  Asthma control questionnaire shows that he is waking up a few times at night.  When he wakes up he is quite severe symptoms he is very limited in his activities because of asthma experiencing a very great deal of shortness of breath and wheezing all the time using albuterol for rescue 5 times daily.  However nitric oxide test is normal.  Clinically is not wheezing actively.  Recently had edema and my nurse practitioner started him on Lasix.  He tells me there are a bunch of cats at home and also significant mold exposure.  The wife is interested in allergy testing but they do not have the financial resources to get rid of the cats or get rid of the mold to sell the house.  They are frustrated by all this.  He says today he will be content taking antibiotic and if it gets worse doing prednisone.  Last chest x-ray October 2019.  He is never had CT chest.       OV 09/19/2019  Subjective:  Patient ID: REES FATEMI, male , DOB: November 15, 1946 , age 73 y.o. , MRN: [id number] , ADDRESS: [place] [id number]   09/19/2019 -   follow-up obstructive lung disease.  To person identifier used to identify the patient on this telephone visit.  Risks, benefits and limitations explained.  Moderate persistent asthma for follow-up.  He is on Dulera and Singulair History of exposure to the mold   HPI JOEVANNY NABOZNY 73 y.o. -presents for this telephone visit.  He says he is doing well.  Socially distancing and trying to avoid COVID-19.  The main purpose of the call is that he wants a refill on the Singulair and was told to make a telephone visit.  He says his symptoms are really well controlled.  Last year he got rid of the mold in the house.  He save money and got this done.  He still has some kittens with is not bothering him.  He says her asthma is really  much better after getting rid of the mold.  He continues on Dulera and Singulair.  Is  up-to-date with his flu shot.  He has some baseline shortness of breath and neuropathy.  He is compliant with his CPAP for sleep apnea.  There are no other new problems     Asthma Control Panel -December 2012eos 400 cells per cubic millimeter \ - IgE not checked as of 12/09/2017  - obstn with low dlco nov 2016 on PFT    - Alpha 1 is MM 08/23/2015  01/29/2016  05/04/2016  07/07/2017  12/09/2017  08/05/2018  11/11/2018   Current Med Regimen symbicort  Dulera  Off breo for several days.  On dulera 100. Struggling to afford and singulair dulera and singulair dulera and singulair dulra and singulair  ACQ 5 point- 1 week. wtd avg score. <1.0 is good control 0.75-1.25 is grey zone. >1.25 poor control. Delta 0.5 is clinically meaningful 3.2 2.2 2.0 2.6 2.8 3.2 4.2  FeNO ppB 34 x 26  13 28  on prednisone 11  FeV1  x  x      Planned intervention  for visit Start dulera + singulair Cont dulera        ROS - per HPI     has a past medical history of Asthma, Chronic diastolic CHF (congestive heart failure) ([place]), Chronic kidney disease (CKD), stage III (moderate) ([place]), Chronic pain, Coronary artery disease, Depression (05/04/2016), DJD (degenerative joint disease), Dyslipidemia, ETOH abuse, Foot ulcer ([place]) (07/07/2017), GERD (gastroesophageal reflux disease), Hypertension, Neuropathy, Obesity, OSA (obstructive sleep apnea), and RBBB.   reports that he has never smoked. He has never used smokeless tobacco.  Past Surgical History:  Procedure Laterality Date   CORONARY STENT PLACEMENT     REPLACEMENT TOTAL KNEE     left knee    No Known Allergies  Immunization History  Administered Date(s) Administered   DTaP 07/22/2001   Influenza Split 09/30/2014   Influenza, High Dose Seasonal PF 11/04/2016, 08/17/2017, 11/03/2018   Influenza,inj,Quad PF,6+ Mos 10/23/2015   Influenza-Unspecified 08/30/2017   Pneumococcal Conjugate-13 01/02/2015, 10/01/2015   Pneumococcal Polysaccharide-23 04/11/2012     Tdap 12/22/2016   Zoster 01/02/2015   Zoster Recombinat (Shingrix) 01/20/2018    Family History  Problem Relation Age of Onset   COPD Mother    Pancreatic cancer Father        pancreatic    Other Cousin        Amyloidosis     Current Outpatient Medications:    albuterol (PROAIR HFA) 108 (90 Base) MCG/ACT inhaler, Inhale 2 puffs into the lungs every 6 (six) hours as needed for wheezing or shortness of breath., Disp: 1 Inhaler, Rfl: 5   albuterol (PROVENTIL) (2.5 MG/3ML) 0.083% nebulizer solution, Take 3 mLs (2.5 mg total) by nebulization every 6 (six) hours as needed for wheezing or shortness of breath., Disp: 75 mL, Rfl: 5   aspirin 81 MG tablet, Take 81 mg by mouth daily.  , Disp: , Rfl:    atorvastatin (LIPITOR) 20 MG tablet, TAKE 1 TABLET BY MOUTH  DAILY, Disp: 90 tablet, Rfl: 0   CALCIUM PO, Take by mouth., Disp: , Rfl:    Cholecalciferol (VITAMIN D3) 1000 units CAPS, Take 1,000 Units by mouth., Disp: , Rfl:    CO ENZYME Q-10 PO, Take by mouth., Disp: , Rfl:    DULERA 100-5 MCG/ACT AERO, INHALE 2 PUFFS INTO THE LUNGS TWICE DAILY, Disp: 13 g, Rfl: 1   escitalopram (LEXAPRO) 20 MG tablet, Take  20 mg by mouth daily., Disp: , Rfl:    esomeprazole (NEXIUM) 40 MG capsule, Take 40 mg by mouth daily before breakfast.  , Disp: , Rfl:    fenofibrate 160 MG tablet, TAKE 1 TABLET BY MOUTH  DAILY WITH FOOD, Disp: 90 tablet, Rfl: 0   fluticasone furoate-vilanterol (BREO ELLIPTA) 100-25 MCG/INH AEPB, Inhale 1 puff into the lungs daily., Disp: 1 each, Rfl: 0   furosemide (LASIX) 20 MG tablet, Take 1 tablet (20 mg total) by mouth every other day., Disp: 45 tablet, Rfl: 2   LORazepam (ATIVAN) 1 MG tablet, Take 1 mg by mouth every 8 (eight) hours., Disp: , Rfl:    Magnesium 250 MG TABS, Take 1 tablet by mouth daily., Disp: , Rfl:    metoprolol tartrate (LOPRESSOR) 25 MG tablet, TAKE 1 TABLET BY MOUTH  TWICE DAILY, Disp: 180 tablet, Rfl: 0   Misc Natural Products (COLON  CLEANSE PO), Take by mouth., Disp: , Rfl:    montelukast (SINGULAIR) 10 MG tablet, TAKE 1 TABLET BY MOUTH AT  BEDTIME, Disp: 90 tablet, Rfl: 0   nitroGLYCERIN (NITROSTAT) 0.4 MG SL tablet, Place 1 tablet (0.4 mg total) under the tongue every 5 (five) minutes as needed., Disp: 25 tablet, Rfl: 3   oxyCODONE (ROXICODONE) 15 MG immediate release tablet, Take 15 mg by mouth every 4 (four) hours as needed.  , Disp: , Rfl:    Spacer/Aero-Holding Chambers (AEROCHAMBER MV) inhaler, Use as instructed, Disp: 1 each, Rfl: 0   tamsulosin (FLOMAX) 0.4 MG CAPS capsule, Take 0.4 mg by mouth daily. , Disp: , Rfl:    vitamin B-12 (CYANOCOBALAMIN) 1000 MCG tablet, Take 1,000 mcg by mouth daily., Disp: , Rfl:    zolpidem (AMBIEN) 10 MG tablet, Take 10 mg by mouth at bedtime as needed for sleep. , Disp: , Rfl:       Objective:   There were no vitals filed for this visit.  Estimated body mass index is 39.28 kg/m as calculated from the following:   Height as of 04/17/19: 5\' 9"  (1.753 m).   Weight as of 04/17/19: 266 lb (120.7 kg).  @WEIGHTCHANGE @  There were no vitals filed for this visit.   Physical Exam Telephone visit.  Sounded optimistic on the phone and without any distress.  Alert and oriented x3         Assessment:       ICD-10-CM   1. Moderate persistent asthma without complication  123456   2. Mold exposure  Z77.120   3. OSA (obstructive sleep apnea)  G47.33        Plan:     Patient Instructions     ICD-10-CM   1. Moderate persistent asthma without complication  123456   2. Mold exposure  Z77.120   3. OSA (obstructive sleep apnea)  G47.33    Asthma: well controlled.  And glad he had the flu shot   plan: continue dulera and Singulair.  Staff will send Singulair refill to [facility] at [place]  History of mold exposure -Glad he got rid of the mold by cleaning the house and repainting the house and glad your symptoms have improved -Do high-resolution CT chest without  contrast in 6 months to ensure there is no chronic lung damage because of the mold exposure  Sleep apnea -Continue CPAP  Follow-up -6 months but after high-resolution CT chest -Return sooner if needed  (> 50% of this 15 min visit spent in face to face counseling or/and coordination of care  by this undersigned MD - Dr Brand Males. This includes one or more of the following documented above: discussion of test results, diagnostic or treatment recommendations, prognosis, risks and benefits of management options, instructions, education, compliance or risk-factor reduction)    SIGNATURE    Dr. Brand Males, M.D., F.C.C.P,  Pulmonary and Critical Care Medicine Staff Physician, [place] Director - Interstitial Lung Disease  Program  [facility] at [place], [place], [address]  Pager: [phone], If no answer or between  15:00h - 7:00h: call [phone] Telephone: [phone]  12:23 PM 09/19/2019

## 2019-09-19 NOTE — Patient Instructions (Addendum)
ICD-10-CM   1. Moderate persistent asthma without complication  123456   2. Mold exposure  Z77.120   3. OSA (obstructive sleep apnea)  G47.33    Asthma: well controlled.  And glad he had the flu shot   plan: continue dulera and Singulair.  Staff will send Singulair refill to [facility] at [place]  History of mold exposure -Glad he got rid of the mold by cleaning the house and repainting the house and glad your symptoms have improved -Do high-resolution CT chest without contrast in 6 months to ensure there is no chronic lung damage because of the mold exposure  Sleep apnea -Continue CPAP  Follow-up -6 months but after high-resolution CT chest -Return sooner if needed

## 2019-10-05 ENCOUNTER — Other Ambulatory Visit: Payer: Self-pay | Admitting: Cardiology

## 2019-10-15 ENCOUNTER — Other Ambulatory Visit: Payer: Self-pay | Admitting: Internal Medicine

## 2019-10-19 ENCOUNTER — Telehealth: Payer: Self-pay | Admitting: *Deleted

## 2019-10-19 NOTE — Telephone Encounter (Signed)
-----   Message from [name], MD sent at 10/18/2019  6:39 PM EST ----- Download reviewed and shows a very high mask leak, increased AHI and reduced compliance.  Please set appt with DME to check patient's mask.  Encourage him to avoid sleeping supine.  Repeat download in 4 weeks.  Encourage him to be more compliant

## 2019-10-19 NOTE — Telephone Encounter (Signed)
Informed patient of compliance results and verbalized understanding was indicated. Patient is aware and agreeable to AHI being out of range at 12.0. Patient is aware and agreeable to having a high mask leak. Patient is aware and agreeable to an increased AHI and reduced compliance. Patient is aware and agreeable to improving in compliance with machine usage. Patient is aware and agreeable to not sleeping supine. Patient states he just got new supplies and he will change out the old ones to maybe improve the leak and and his AHI. Patient says no appointment is needed for the dme at this time he wants to change his supplies and repeat a download in 4 weeks. Pt is aware and agreeable to treatment.

## 2019-10-20 ENCOUNTER — Telehealth (INDEPENDENT_AMBULATORY_CARE_PROVIDER_SITE_OTHER): Payer: Medicare Other | Admitting: Cardiology

## 2019-10-20 ENCOUNTER — Other Ambulatory Visit: Payer: Self-pay

## 2019-10-20 ENCOUNTER — Encounter: Payer: Self-pay | Admitting: Cardiology

## 2019-10-20 VITALS — BP 125/69 | HR 69 | Ht 70.0 in | Wt 276.0 lb

## 2019-10-20 DIAGNOSIS — I420 Dilated cardiomyopathy: Secondary | ICD-10-CM | POA: Diagnosis not present

## 2019-10-20 DIAGNOSIS — N1831 Chronic kidney disease, stage 3a: Secondary | ICD-10-CM

## 2019-10-20 DIAGNOSIS — I2510 Atherosclerotic heart disease of native coronary artery without angina pectoris: Secondary | ICD-10-CM | POA: Diagnosis not present

## 2019-10-20 DIAGNOSIS — E785 Hyperlipidemia, unspecified: Secondary | ICD-10-CM

## 2019-10-20 DIAGNOSIS — I5032 Chronic diastolic (congestive) heart failure: Secondary | ICD-10-CM

## 2019-10-20 DIAGNOSIS — I10 Essential (primary) hypertension: Secondary | ICD-10-CM | POA: Diagnosis not present

## 2019-10-20 DIAGNOSIS — G4733 Obstructive sleep apnea (adult) (pediatric): Secondary | ICD-10-CM

## 2019-10-20 NOTE — Patient Instructions (Addendum)
  Medication Instructions:  Your physician recommends that you continue on your current medications as directed. Please refer to the Current Medication list given to you today.  *If you need a refill on your cardiac medications before your next appointment, please call your pharmacy*  Follow-Up: At [facility], you and your health needs are our priority.  As part of our continuing mission to provide you with exceptional heart care, we have created designated Provider Care Teams.  These Care Teams include your primary Cardiologist (physician) and Advanced Practice Providers (APPs -  Physician Assistants and Nurse Practitioners) who all work together to provide you with the care you need, when you need it.  Your next appointment:   6 month(s)  The format for your next appointment:   Virtual Visit   Provider:   Fransico Him, MD
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
## 2019-11-20 MED ORDER — FENOFIBRATE 160 MG PO TABS: 160.0000 mg | ORAL_TABLET | Freq: Every day | ORAL | 1 refills | Status: DC

## 2019-12-14 ENCOUNTER — Other Ambulatory Visit: Payer: Self-pay | Admitting: Cardiology

## 2019-12-14 MED ORDER — ATORVASTATIN CALCIUM 20 MG PO TABS: 20.0000 mg | ORAL_TABLET | Freq: Every day | ORAL | 3 refills | Status: DC

## 2019-12-14 NOTE — Telephone Encounter (Signed)
Pt's medication was sent to pt's pharmacy as requested. Confirmation received.  °

## 2020-01-12 ENCOUNTER — Other Ambulatory Visit: Payer: Self-pay

## 2020-01-12 ENCOUNTER — Inpatient Hospital Stay: Payer: Medicare Other | Attending: Hematology

## 2020-01-12 DIAGNOSIS — Z79899 Other long term (current) drug therapy: Secondary | ICD-10-CM | POA: Diagnosis not present

## 2020-01-12 DIAGNOSIS — D472 Monoclonal gammopathy: Secondary | ICD-10-CM | POA: Diagnosis present

## 2020-01-12 DIAGNOSIS — F329 Major depressive disorder, single episode, unspecified: Secondary | ICD-10-CM | POA: Diagnosis not present

## 2020-01-12 DIAGNOSIS — N183 Chronic kidney disease, stage 3 unspecified: Secondary | ICD-10-CM | POA: Diagnosis not present

## 2020-01-12 DIAGNOSIS — Z7982 Long term (current) use of aspirin: Secondary | ICD-10-CM | POA: Insufficient documentation

## 2020-01-12 DIAGNOSIS — I10 Essential (primary) hypertension: Secondary | ICD-10-CM | POA: Insufficient documentation

## 2020-01-12 LAB — CBC WITH DIFFERENTIAL/PLATELET
Abs Immature Granulocytes: 0.04 10*3/uL (ref 0.00–0.07)
Basophils Absolute: 0.1 10*3/uL (ref 0.0–0.1)
Basophils Relative: 1 %
Eosinophils Absolute: 0.4 10*3/uL (ref 0.0–0.5)
Eosinophils Relative: 5 %
HCT: 39.6 % (ref 39.0–52.0)
Hemoglobin: 12.8 g/dL — ABNORMAL LOW (ref 13.0–17.0)
Immature Granulocytes: 1 %
Lymphocytes Relative: 20 %
Lymphs Abs: 1.6 10*3/uL (ref 0.7–4.0)
MCH: 31.2 pg (ref 26.0–34.0)
MCHC: 32.3 g/dL (ref 30.0–36.0)
MCV: 96.6 fL (ref 80.0–100.0)
Monocytes Absolute: 0.7 10*3/uL (ref 0.1–1.0)
Monocytes Relative: 9 %
Neutro Abs: 5.1 10*3/uL (ref 1.7–7.7)
Neutrophils Relative %: 64 %
Platelets: 196 10*3/uL (ref 150–400)
RBC: 4.1 MIL/uL — ABNORMAL LOW (ref 4.22–5.81)
RDW: 13.1 % (ref 11.5–15.5)
WBC: 7.9 10*3/uL (ref 4.0–10.5)
nRBC: 0 % (ref 0.0–0.2)

## 2020-01-12 LAB — CMP (CANCER CENTER ONLY)
ALT: 13 U/L (ref 0–44)
AST: 18 U/L (ref 15–41)
Albumin: 3.9 g/dL (ref 3.5–5.0)
Alkaline Phosphatase: 59 U/L (ref 38–126)
Anion gap: 11 (ref 5–15)
BUN: 14 mg/dL (ref 8–23)
CO2: 27 mmol/L (ref 22–32)
Calcium: 9 mg/dL (ref 8.9–10.3)
Chloride: 101 mmol/L (ref 98–111)
Creatinine: 0.83 mg/dL (ref 0.61–1.24)
GFR, Est AFR Am: >60 mL/min (ref >60)
GFR, Estimated: >60 mL/min (ref >60)
Glucose, Bld: 94 mg/dL (ref 70–99)
Potassium: 4.2 mmol/L (ref 3.5–5.1)
Sodium: 139 mmol/L (ref 135–145)
Total Bilirubin: 1 mg/dL (ref 0.3–1.2)
Total Protein: 7.4 g/dL (ref 6.5–8.1)

## 2020-01-16 LAB — MULTIPLE MYELOMA PANEL, SERUM
Albumin SerPl Elph-Mcnc: 3.7 g/dL (ref 2.9–4.4)
Albumin/Glob SerPl: 1.2 (ref 0.7–1.7)
Alpha 1: 0.2 g/dL (ref 0.0–0.4)
Alpha2 Glob SerPl Elph-Mcnc: 0.8 g/dL (ref 0.4–1.0)
B-Globulin SerPl Elph-Mcnc: 1.1 g/dL (ref 0.7–1.3)
Gamma Glob SerPl Elph-Mcnc: 1 g/dL (ref 0.4–1.8)
Globulin, Total: 3.2 g/dL (ref 2.2–3.9)
IgA: 278 mg/dL (ref 61–437)
IgG (Immunoglobin G), Serum: 1070 mg/dL (ref 603–1613)
IgM (Immunoglobulin M), Srm: 35 mg/dL (ref 15–143)
M Protein SerPl Elph-Mcnc: 0.3 g/dL — ABNORMAL HIGH
Total Protein ELP: 6.9 g/dL (ref 6.0–8.5)

## 2020-01-19 ENCOUNTER — Inpatient Hospital Stay (HOSPITAL_BASED_OUTPATIENT_CLINIC_OR_DEPARTMENT_OTHER): Payer: Medicare Other | Admitting: Hematology

## 2020-01-19 ENCOUNTER — Telehealth: Payer: Self-pay | Admitting: Hematology

## 2020-01-19 ENCOUNTER — Other Ambulatory Visit: Payer: Self-pay

## 2020-01-19 VITALS — BP 149/77 | HR 66 | Temp 98.2°F | Resp 17 | Ht 70.0 in | Wt 279.7 lb

## 2020-01-19 DIAGNOSIS — D472 Monoclonal gammopathy: Secondary | ICD-10-CM | POA: Diagnosis not present

## 2020-01-19 NOTE — Progress Notes (Signed)
HEMATOLOGY/ONCOLOGY CLINIC NOTE  Date of Service: 01/19/2020  Patient Care Team: [name], MD as PCP - General (Family Medicine) [name], MD as Consulting Physician (Cardiology)  CHIEF COMPLAINTS/PURPOSE OF CONSULTATION:  MGUS  HISTORY OF PRESENTING ILLNESS:   Keith Bernard is a wonderful 74 y.o. male who has been previously followed by my colleague Dr. Grace Bernard for evaluation and management of MGUS. He is accompanied today by his wife. The pt reports that he is doing well overall.   The pt reports that he has had less energy over the last couple years but denies any recent changes in his energy levels. The pt's wife notes that his PCP originally referred him to a nephrologist for observed blood and protein in the urine. This work up ended up in a referral to my colleague Dr. Lebron Bernard.   The pt notes that he is often tired, doesn't sleep very well, as he ends up watching TV into the morning and isn't always compliant with his BIPAP. He does not endorse any other concerns that have developed over the last couple months.   The pt notes that he uses Androgel.   Most recent lab results (07/05/18) of CBC w/diff and CMP is as follows: all values are WNL except for RBC at 3.91, HGB at 12.7, HCT at 37.8, and Glucose at 102. MMP 07/05/18 revealed all values WNL except for M Protein at 0.4. Kappa/Lambda 07/05/18 revealed all values WNL except for Kappa at 23.9 LDH 07/05/18 was WNL at 154  On review of systems, pt reports feeling tired, and denies pain along the spine, new bone pains, fevers, chills, night sweats and any other symptoms.   Interval History:   Keith Bernard returns today for management and evaluation of his MGUS. The patient's last visit with [place] was on 01/18/2019. The pt reports that he is doing well overall.  The pt reports no acute new symptoms. No focal bone pains.  Lab results today (01/12/20) of CBC w/diff and CMP is as follows: all values are WNL except  for RBC at 4.10, Hgb at 12.8. 01/12/2020 MMP is as follows: all values are WNL except for M Protien SerPl Elph-Mcnc at 0.3 and IFE 1 shows Immunofixation shows IgG monoclonal protein with lambda light chain  specificity.    MEDICAL HISTORY:  Past Medical History:  Diagnosis Date  . Asthma   . Chronic diastolic CHF (congestive heart failure) ([place])   . Chronic kidney disease (CKD), stage III (moderate)   . Chronic pain   . 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 Depression 05/04/2016  . DJD (degenerative joint disease)   . Dyslipidemia   . ETOH abuse   . Foot ulcer ([place]) 07/07/2017  . GERD (gastroesophageal reflux disease)   . Hypertension   . Neuropathy   . Obesity   . OSA (obstructive sleep apnea)    Moderate OSA w AHI 17.7/hr now on VPAP at EEP at 7cm H2O, min PS 3cm H2O,max PS 15cm H2O  . RBBB     SURGICAL HISTORY: Past Surgical History:  Procedure Laterality Date  . CORONARY STENT PLACEMENT    . REPLACEMENT TOTAL KNEE     left knee    SOCIAL HISTORY: Social History   Socioeconomic History  . Marital  status: Married    Spouse name: Not on file  . Number of children: Not on file  . Years of education: Not on file  . Highest education level: Not on file  Occupational History  . Occupation: Risk manager  Tobacco Use  . Smoking status: Never Smoker  . Smokeless tobacco: Never Used  Substance and Sexual Activity  . Alcohol use: Yes    Alcohol/week: 0.0 standard drinks    Comment: Moderate, beer  . Drug use: No  . Sexual activity: Not on file  Other Topics Concern  . Not on file  Social History Narrative  . Not on file   Social Determinants of Health   Financial Resource Strain:   . Difficulty of Paying Living Expenses: Not on file  Food Insecurity:   . Worried About Charity fundraiser in the  Last Year: Not on file  . Ran Out of Food in the Last Year: Not on file  Transportation Needs:   . Lack of Transportation (Medical): Not on file  . Lack of Transportation (Non-Medical): Not on file  Physical Activity:   . Days of Exercise per Week: Not on file  . Minutes of Exercise per Session: Not on file  Stress:   . Feeling of Stress : Not on file  Social Connections:   . Frequency of Communication with Friends and Family: Not on file  . Frequency of Social Gatherings with Friends and Family: Not on file  . Attends Religious Services: Not on file  . Active Member of Clubs or Organizations: Not on file  . Attends Archivist Meetings: Not on file  . Marital Status: Not on file  Intimate Partner Violence:   . Fear of Current or Ex-Partner: Not on file  . Emotionally Abused: Not on file  . Physically Abused: Not on file  . Sexually Abused: Not on file    FAMILY HISTORY: Family History  Problem Relation Age of Onset  . COPD Mother   . Pancreatic cancer Father        pancreatic   . Other Cousin        Amyloidosis    ALLERGIES:  has No Known Allergies.  MEDICATIONS:  Current Outpatient Medications  Medication Sig Dispense Refill  . albuterol (PROAIR HFA) 108 (90 Base) MCG/ACT inhaler Inhale 2 puffs into the lungs every 6 (six) hours as needed for wheezing or shortness of breath. 1 Inhaler 5  . albuterol (PROVENTIL) (2.5 MG/3ML) 0.083% nebulizer solution Take 3 mLs (2.5 mg total) by nebulization every 6 (six) hours as needed for wheezing or shortness of breath. 75 mL 5  . aspirin 81 MG tablet Take 81 mg by mouth daily.      Marland Kitchen atorvastatin (LIPITOR) 20 MG tablet Take 1 tablet (20 mg total) by mouth daily. 90 tablet 3  . CALCIUM PO Take by mouth 2 (two) times daily.     . Cholecalciferol (VITAMIN D3) 1000 units CAPS Take 1,000 Units by mouth.    . CO ENZYME Q-10 PO Take by mouth daily.     . DULERA 100-5 MCG/ACT AERO INHALE 2 PUFFS INTO THE LUNGS TWICE DAILY 13 g 3   . escitalopram (LEXAPRO) 20 MG tablet Take 20 mg by mouth daily.    Marland Kitchen esomeprazole (NEXIUM) 40 MG capsule Take 40 mg by mouth daily before breakfast.      . fenofibrate 160 MG tablet Take 1 tablet (160 mg total) by mouth daily. with food 90 tablet 1  .  fluticasone furoate-vilanterol (BREO ELLIPTA) 100-25 MCG/INH AEPB Inhale 1 puff into the lungs daily. 1 each 0  . furosemide (LASIX) 20 MG tablet Take 1 tablet (20 mg total) by mouth every other day. 45 tablet 2  . LORazepam (ATIVAN) 1 MG tablet Take 1 mg by mouth every 8 (eight) hours.    . Magnesium 250 MG TABS Take 1 tablet by mouth daily.    . metoprolol tartrate (LOPRESSOR) 25 MG tablet TAKE 1 TABLET BY MOUTH  TWICE DAILY 180 tablet 1  . Misc Natural Products (COLON CLEANSE PO) Take by mouth.    . Misc. Devices [place] brace. He has a double upright brace that is no longer enough support for the arthritis and instability of his midfoot and ankle on his right.  It is in lieu of surgical correction.    . montelukast (SINGULAIR) 10 MG tablet Take 1 tablet (10 mg total) by mouth at bedtime. 90 tablet 1  . nitroGLYCERIN (NITROSTAT) 0.4 MG SL tablet Place 1 tablet (0.4 mg total) under the tongue every 5 (five) minutes as needed. 25 tablet 3  . Oxycodone HCl 10 MG TABS Take 10 mg by mouth every 6 (six) hours as needed.    Marland Kitchen Spacer/Aero-Holding Chambers (AEROCHAMBER MV) inhaler Use as instructed 1 each 0  . tamsulosin (FLOMAX) 0.4 MG CAPS capsule Take 0.4 mg by mouth daily.     Marland Kitchen triamcinolone (NASACORT) 55 MCG/ACT AERO nasal inhaler 2 puffs in each nostril    . vitamin B-12 (CYANOCOBALAMIN) 1000 MCG tablet Take 1,000 mcg by mouth daily.    Marland Kitchen zolpidem (AMBIEN) 10 MG tablet Take 10 mg by mouth at bedtime as needed for sleep.      No current facility-administered medications for this visit.    REVIEW OF SYSTEMS:    .10 Point review of Systems was done is negative except as noted above.   PHYSICAL EXAMINATION:  . Vitals:   01/19/20 1300   BP: (!) 149/77  Pulse: 66  Resp: 17  Temp: 98.2 F (36.8 C)  SpO2: 90%   Filed Weights   01/19/20 1300  Weight: 279 lb 11.2 oz (126.9 kg)   .Body mass index is 40.13 kg/m. GENERAL:alert, in no acute distress and comfortable SKIN: no acute rashes, no significant lesions EYES: conjunctiva are pink and non-injected, sclera anicteric OROPHARYNX: MMM, no exudates, no oropharyngeal erythema or ulceration NECK: supple, no JVD LYMPH:  no palpable lymphadenopathy in the cervical, axillary or inguinal regions LUNGS: clear to auscultation b/l with normal respiratory effort HEART: regular rate & rhythm ABDOMEN:  normoactive bowel sounds , non tender, not distended. No palpable hepatosplenomegaly.  Extremity: no pedal edema PSYCH: alert & oriented x 3 with fluent speech NEURO: no focal motor/sensory deficits   LABORATORY DATA:  I have reviewed the data as listed  . CBC Latest Ref Rng & Units 01/12/2020 01/11/2019 07/05/2018  WBC 4.0 - 10.5 K/uL 7.9 6.7 4.3  Hemoglobin 13.0 - 17.0 g/dL 12.8(L) 13.1 12.7(L)  Hematocrit 39.0 - 52.0 % 39.6 40.8 37.8(L)  Platelets 150 - 400 K/uL 196 211 178    . CMP Latest Ref Rng & Units 01/12/2020 05/03/2019 01/11/2019  Glucose 70 - 99 mg/dL 94 89 93  BUN 8 - 23 mg/dL 14 14 19   Creatinine 0.61 - 1.24 mg/dL 0.83 1.00 0.94  Sodium 135 - 145 mmol/L 139 138 138  Potassium 3.5 - 5.1 mmol/L 4.2 4.3 4.9  Chloride 98 - 111 mmol/L 101 94(L) 101  CO2  22 - 32 mmol/L 27 27 29   Calcium 8.9 - 10.3 mg/dL 9.0 9.6 9.2  Total Protein 6.5 - 8.1 g/dL 7.4 6.7 7.3  Total Bilirubin 0.3 - 1.2 mg/dL 1.0 0.5 0.8  Alkaline Phos 38 - 126 U/L 59 63 70  AST 15 - 41 U/L 18 16 18   ALT 0 - 44 U/L 13 11 12       RADIOGRAPHIC STUDIES: I have personally reviewed the radiological images as listed and agreed with the findings in the report. No results found.  ASSESSMENT & PLAN:   74 y.o. male with   1. IgG Lambda MGUS Labs upon initial presentation from 07/05/18, HGB at 12.7, M  Protein at 0.4, Creatinine normal at 0.94, Calcium normal at 9.0 10/28/17 Bone Survey revealed Negative for lytic or sclerotic lesion. Degenerative change throughout the spine is worst in the cervical and lumbar spine. Atherosclerosis  PLAN: Labs reviewed with patient. No evidence of progression of patient MGUS at this time. No anemia, renal insuff , hypercalcemia or new focal bone pain. Continue f/u with PCP   FOLLOW UP: RTC with Dr Irene Limbo with labs in 12 months with labs (plz schedule lab 1 week prior to clinic visit)  The total time spent in the appt was 15 minutes and more than 50% was on counseling and direct patient cares.  All of the patient's questions were answered with apparent satisfaction. The patient knows to call the clinic with any problems, questions or concerns.   Sullivan Lone MD [place] AAHIVMS [facility] Hematology/Oncology Physician [facility]  (Office):       [phone] (Work cell):  [phone] (Fax):           [phone]  01/19/2020 10:20 AM  I, Yevette Edwards, am acting as a scribe for Dr. Sullivan Lone.   .I have reviewed the above documentation for accuracy and completeness, and I agree with the above. Brunetta Genera MD

## 2020-01-19 NOTE — Telephone Encounter (Signed)
Scheduled appt per 2/19 los - gave patient AVS and calender per los.   

## 2020-02-12 ENCOUNTER — Other Ambulatory Visit: Payer: Self-pay | Admitting: Internal Medicine

## 2020-04-17 ENCOUNTER — Ambulatory Visit: Payer: Medicare Other | Admitting: Cardiology

## 2020-04-26 ENCOUNTER — Ambulatory Visit: Payer: Medicare Other | Admitting: Internal Medicine

## 2020-04-30 ENCOUNTER — Ambulatory Visit: Payer: Medicare Other | Admitting: Internal Medicine

## 2020-05-07 ENCOUNTER — Ambulatory Visit (INDEPENDENT_AMBULATORY_CARE_PROVIDER_SITE_OTHER): Payer: Medicare Other | Admitting: Internal Medicine

## 2020-05-07 ENCOUNTER — Encounter: Payer: Self-pay | Admitting: Internal Medicine

## 2020-05-07 ENCOUNTER — Other Ambulatory Visit: Payer: Self-pay

## 2020-05-07 VITALS — BP 126/78 | HR 71 | Temp 98.5°F | Ht 70.0 in | Wt 278.0 lb

## 2020-05-07 DIAGNOSIS — R0602 Shortness of breath: Secondary | ICD-10-CM

## 2020-05-07 DIAGNOSIS — Z77120 Contact with and (suspected) exposure to mold (toxic): Secondary | ICD-10-CM | POA: Diagnosis not present

## 2020-05-07 DIAGNOSIS — J4540 Moderate persistent asthma, uncomplicated: Secondary | ICD-10-CM | POA: Diagnosis not present

## 2020-05-07 DIAGNOSIS — F419 Anxiety disorder, unspecified: Secondary | ICD-10-CM | POA: Diagnosis not present

## 2020-05-07 LAB — CBC WITH DIFFERENTIAL/PLATELET
Basophils Absolute: 0.1 10*3/uL (ref 0.0–0.1)
Basophils Relative: 1.1 % (ref 0.0–3.0)
Eosinophils Absolute: 0.2 10*3/uL (ref 0.0–0.7)
Eosinophils Relative: 2.5 % (ref 0.0–5.0)
HCT: 35.9 % — ABNORMAL LOW (ref 39.0–52.0)
Hemoglobin: 11.9 g/dL — ABNORMAL LOW (ref 13.0–17.0)
Lymphocytes Relative: 19.1 % (ref 12.0–46.0)
Lymphs Abs: 1.4 10*3/uL (ref 0.7–4.0)
MCHC: 33.1 g/dL (ref 30.0–36.0)
MCV: 95.7 fl (ref 78.0–100.0)
Monocytes Absolute: 0.7 10*3/uL (ref 0.1–1.0)
Monocytes Relative: 8.9 % (ref 3.0–12.0)
Neutro Abs: 5.1 10*3/uL (ref 1.4–7.7)
Neutrophils Relative %: 68.4 % (ref 43.0–77.0)
Platelets: 231 10*3/uL (ref 150.0–400.0)
RBC: 3.75 Mil/uL — ABNORMAL LOW (ref 4.22–5.81)
RDW: 13.4 % (ref 11.5–15.5)
WBC: 7.4 10*3/uL (ref 4.0–10.5)

## 2020-05-07 NOTE — Progress Notes (Signed)
OV 01/29/2016  Chief Complaint  Patient presents with  . Follow-up    Pt here after acute visit with SG on 2.10.17. Pt states his breathing has imrpoved since last OV. Pt states he has occassional wheezing and mild non prod cough. Pt denies CP/tightness.     Keith Bernard returns for chronic obstructive lung disease asthma follow-up. He is now on Dulera plus Singulair. He is compliant with Dulera. He says with improved compliance his symptoms are a lot better. His wife is here with him. Most recently he had a flareup and was treated with prednisone. I review the chart when he was seen by [name] gross my nurse practitioner. Currently he feels asthma is well controlled. However he still uses albuterol multiple times a day for rescue but denies any nocturnal symptoms or daytime symptoms. He is compliant with this CPAP for sleep apnea.  Review of the chart shows this is in December 2012 has elevated eos 400 cells per cubic millimeter. I notice that and IgE was never checked. In any event subjectively is well controlled currently. Also noted on alpha-1 has not been checked.  Asthma control questionnaire shows that at night he hardly ever wakes up because of asthma. When he wakes up he has very mild symptoms. In the daytime he is moderately limited because of asthma although this could be because of obesity. He also has moderate shortness of breath because of asthma although this could be because of obesity. He is wheezing a lot of the time and uses albuterol 3-4 puffs on most days. Nevertheless average score of 2.2 show significant improvement after starting [place] 05/04/2016  Chief Complaint  Patient presents with  . Follow-up    Pt c/o occasional wheeze since not being on Breo for about a week. Pt has been using albuterol HFA more. Pt denies cough/SOB/CP/tightness.    Follow-up asthma. Last seen March 2017. This is a routine follow-up. I put him on Breo when she really liked. However the  cost dollars out of pocket because he is in the donut hole. So he ran out of it over a week ago and is having increased symptoms. Asthma control questionnaire is 2.0 and still within his range from prior visits. Exhaled nitric oxide is just over 25 suggests some amount of active airway inflammation. His wife is here with him and she says that she got approved for the [facility] patient assistance program but apparently between our office in the mailroom the paperwork has been loss. She is willing to redo the paperwork and she is wondering if I could sign my part of it this week.  In terms of symptoms specifically tells me that he does wake up a few times at night. When he wakes up he has very mild symptoms. Slightly limited in his activities because of asthma. He has moderate amount of shortness of breath with exertion. He does wheeze a little of the time and is using albuterol for rescue at least one or 2 times daily. 5 point score is 2/.0 showing activity - is worse this past week or so is within normal   OV 01/04/2017   Chief Complaint  Patient presents with  . Follow-up    Pt states last week he had an 'asthma attack' and went to ED in [place] and was given steroid and abx per pt's wife. Pt states he feels he is improving. Pt states he does not feel back to baseline. Pt denies CP/tightness and  f/c/s.     Follow-up moderate persistent asthma  Last seen June 2017. He presents with his wife as always. He is on Dulera through the assistance program. Some 3 weeks ago his sister died from terminal cancer. Take a flight to [place]. His been dealing with those emotions. Because of the travel, emotions and weather change some 10 days ago he had an asthma exacerbation but never below few days with increased cough and wheezing. He went to a local emergency department and got one shot of steroids and was given 7 days of cephalexin which is completed. Despite this is not fully better. He still has some cough  and wheeze and yellow sputum. There is no fever.   OV 07/07/2017  Chief Complaint  Patient presents with  . Follow-up    Pt using Dulera, still having trouble getting this covered by insurance and getting Financial Assistance/Patient Assistance. Pt states that his breathing is doing better after senc round of Abx/Prednisone.      Follow-up moderate persistent asthma   Last seen February 2018. He presents with his wife. Overall he is doing stable except with the summer heat his symptoms are slightly more than usual as seen in the asthma control questionnaire. He says that he does not wake up at night because of asthma. When he wakes up he has mild symptoms. He has limitation in his activities because of asthma but this could also be because of obesity and DJD and spine issues. He does get dyspneic for moderate amount of the time and is wheezing a lot of the time but using albuterol for rescue 1-2 puffs per day. He's having significant difficulty getting [employer] to pay for his assistance program and is in need of samples today. He continues on Singulair.   OV 12/09/2017  Chief Complaint  Patient presents with  . Follow-up    shortness of breath   Follow-up moderate persistent asthma  Routine 70-month follow-up.  He presents with his wife.  Overall he is doing fine.  He has significant symptoms on his asthma control questionnaire.  In fact his symptoms are slowly deteriorated.  Average score is 2.8.  But this resolved mostly shortness of breath.  He feels is due to obesity.  His nitric oxide is 12 showing good asthma control on Dulera and Singulair.  He plans to lose weight.  Recently had a fall and hurt the shoulder.  Review of the images show is not had a chest x-ray since 2010.  He is up-to-date with his flu shot.  In terms of his asthma control questionnaire he hardly ever wakes up at night.  When he wakes up he has moderate symptoms.  He is limited with his activities moderately and does  express a moderate amount of shortness of breath but he does wheeze occasionally.  He is using albuterol for rescue multiple times a day.  =  OV 08/05/2018  Subjective:  Patient ID: Keith Bernard, male , DOB: January 31, 1946 , age 74 y.o. , MRN: [id number] , ADDRESS: [place] [id number]   08/05/2018 -   Chief Complaint  Patient presents with  . Follow-up    Pt has been sick and was put on prednisone and abx which he began Monday, 9/2.  Pt has c/o cough with green mucus, increased SOB. Denies any CP or chest tightness.     HPI KHOLE BRANCH 74 y.o. -moderate persistent asthma patient here for follow-up. He is here with his  wife. They tell me that has been many months since he has taken his inhale corticosteroid Dulera because the patient assistance program has been slow to respond.He is relying on sampples. Apparently got some Symbicort from usat some point in the past since he last saw me. However he has run out of that as well. He is not sure how long it is since he ran out of that. So he is having a flareup. He is just dependent on prednisone for the last 5 days with antibiotic symptoms by our nurse practitioner.He does not feel fully back to baseline. His asthma control question is 3.2 showing significant nocturnal symptoms or albuterol rescue use OF breath and wheezing. In fact on exam he had some wheezing is exhaled nitric oxide was 28 while on prednisone       OV 11/11/2018  Subjective:  Patient ID: Keith Bernard, male , DOB: 24-Feb-1946 , age 58 y.o. , MRN: [id number] , ADDRESS: [place] [id number]   11/11/2018 -   Chief Complaint  Patient presents with  . Follow-up    f/u asthma. Pt states he has had a recent flare up, wheezing has increased.   Moderate persistent asthma for follow-up.  He is on [place] and Singulair  HPI CORDARRELL SANE 74 y.o. -for the last 3 days he is reporting increased cough with congestion and some  mild increase in wheezing with green sputum.  Asthma control questionnaire shows that he is waking up a few times at night.  When he wakes up he is quite severe symptoms he is very limited in his activities because of asthma experiencing a very great deal of shortness of breath and wheezing all the time using albuterol for rescue 5 times daily.  However nitric oxide test is normal.  Clinically is not wheezing actively.  Recently had edema and my nurse practitioner started him on Lasix.  He tells me there are a bunch of cats at home and also significant mold exposure.  The wife is interested in allergy testing but they do not have the financial resources to get rid of the cats or get rid of the mold to sell the house.  They are frustrated by all this.  He says today he will be content taking antibiotic and if it gets worse doing prednisone.  Last chest x-ray October 2019.  He is never had CT chest.       OV 09/19/2019  Subjective:  Patient ID: JERAN HILTZ, male , DOB: [birth year] , age 26 y.o. , MRN: [id number] , ADDRESS: [place] [id number]   09/19/2019 -   follow-up obstructive lung disease.  To person identifier used to identify the patient on this telephone visit.  Risks, benefits and limitations explained.  Moderate persistent asthma for follow-up.  He is on Dulera and Singulair History of exposure to the mold   HPI BAILEY KOLBE 74 y.o. -presents for this telephone visit.  He says he is doing well.  Socially distancing and trying to avoid COVID-19.  The main purpose of the call is that he wants a refill on the Singulair and was told to make a telephone visit.  He says his symptoms are really well controlled.  Last year he got rid of the mold in the house.  He save money and got this done.  He still has some kittens with is not bothering him.  He says her asthma is really much  better after getting rid of the mold.  He continues on Dulera and Singulair.  Is  up-to-date with his flu shot.  He has some baseline shortness of breath and neuropathy.  He is compliant with his CPAP for sleep apnea.  There are no other new problems     ROS - per HPI     OV 05/07/2020  Subjective:  Patient ID: JABORI HENEGAR, male , DOB: [birth year] , age 34 y.o. , MRN: [id number] , ADDRESS: [place] [id number]   05/07/2020 -   Chief Complaint  Patient presents with  . Follow-up    Pt states he has been doing okay since last visit. Pt states that he does become SOB due to being overweight.   Moderate persistent asthma for follow-up.  He is on Dulera and Singulair History of exposure to the mold   HPI DARRAGH NAY 74 y.o. -returns for follow-up.  He presents with his wife.  Wife is giving most of the history and is having to redirect the husband.  As best as I can gather it appears the asthma is well controlled.  However the ACT score is 14 suggesting poor control.  In talking to him I find out that he is using his albuterol multiple times a day.  He thinks this is a habit but also is using it because of sore throat.  He also states that he is also using it for shortness of breath.  He is not having any nocturnal symptoms of wheezing.  He just uses albuterol randomly at the daytime.  However wife and he overall feels asthma is well controlled with Dulera and Singulair.  They believe the Ruthe Mannan is a life saver.  Asking for charity support of filling a form for [facility].  In terms of his mold exposure he has gotten rid of the mold.  He feels masking and social distancing has helped his respiratory quality of life.     Asthma Control Test ACT Total Score  05/07/2020 14       Asthma Control Panel -December 2021 -  eos 400 cells per cubic millimeter \ - IgE not checked as of 05/07/2020 - obstn with low dlco nov 2016 on PFT - normal IgG, IgM and IgA  -feb 2021   - Alpha 1 is MM 08/23/2015  01/29/2016  05/04/2016  07/07/2017  12/09/2017   08/05/2018  11/11/2018  05/07/2020   Current Med Regimen symbicort  Dulera  Off breo for several days.  On dulera 100. Struggling to afford and singulair dulera and singulair dulera and singulair dulra and singulair   ACT - a [employer] test - 4 week. Total score. Max is 25, Lower score is worse.  <19 = poor control        14  ACQ 5 point- 1 week. wtd avg score. <1.0 is good control 0.75-1.25 is grey zone. >1.25 poor control. Delta 0.5 is clinically meaningful 3.2 2.2 2.0 2.6 2.8 3.2 4.2   FeNO ppB 34 x 26  13 28  on prednisone 11   FeV1  x  x       Planned intervention  for visit Start dulera + singulair Cont dulera         ROS - per HPI      has a past medical history of Asthma, Chronic diastolic CHF (congestive heart failure) ([place]), Chronic kidney disease (CKD), stage III (moderate), Chronic pain, Coronary artery disease, Depression (05/04/2016), DJD (degenerative joint disease),  Dyslipidemia, ETOH abuse, Foot ulcer ([place]) (07/07/2017), GERD (gastroesophageal reflux disease), Hypertension, Neuropathy, Obesity, OSA (obstructive sleep apnea), and RBBB.   reports that he has never smoked. He has never used smokeless tobacco.  Past Surgical History:  Procedure Laterality Date  . CORONARY STENT PLACEMENT    . REPLACEMENT TOTAL KNEE     left knee    No Known Allergies  Immunization History  Administered Date(s) Administered  . DTaP 07/22/2001  . Influenza Split 09/30/2014  . Influenza, High Dose Seasonal PF 11/04/2016, 08/17/2017, 11/03/2018, 09/08/2019  . Influenza, Seasonal, Injecte, Preservative Fre 08/30/2017  . Influenza,inj,Quad PF,6+ Mos 10/23/2015  . Influenza-Unspecified 08/30/2017  . Moderna SARS-COVID-2 Vaccination 02/08/2020, 03/09/2020  . Pneumococcal Conjugate-13 01/02/2015, 10/01/2015  . Pneumococcal Polysaccharide-23 04/11/2012  . Pneumococcal-Unspecified 04/11/2012  . Tdap 12/22/2016  . Zoster 01/02/2015  . Zoster Recombinat (Shingrix) 07/21/2017, 01/20/2018    Family  History  Problem Relation Age of Onset  . COPD Mother   . Pancreatic cancer Father        pancreatic   . Other Cousin        Amyloidosis     Current Outpatient Medications:  .  albuterol (PROAIR HFA) 108 (90 Base) MCG/ACT inhaler, Inhale 2 puffs into the lungs every 6 (six) hours as needed for wheezing or shortness of breath., Disp: 1 Inhaler, Rfl: 5 .  albuterol (PROVENTIL) (2.5 MG/3ML) 0.083% nebulizer solution, Take 3 mLs (2.5 mg total) by nebulization every 6 (six) hours as needed for wheezing or shortness of breath., Disp: 75 mL, Rfl: 5 .  aspirin 81 MG tablet, Take 81 mg by mouth daily.  , Disp: , Rfl:  .  atorvastatin (LIPITOR) 20 MG tablet, Take 1 tablet (20 mg total) by mouth daily., Disp: 90 tablet, Rfl: 3 .  CALCIUM PO, Take by mouth 2 (two) times daily. , Disp: , Rfl:  .  Cholecalciferol (VITAMIN D3) 1000 units CAPS, Take 1,000 Units by mouth., Disp: , Rfl:  .  CO ENZYME Q-10 PO, Take by mouth daily. , Disp: , Rfl:  .  DULERA 100-5 MCG/ACT AERO, INHALE 2 PUFFS INTO THE LUNGS TWICE DAILY, Disp: 13 g, Rfl: 3 .  escitalopram (LEXAPRO) 20 MG tablet, Take 20 mg by mouth daily., Disp: , Rfl:  .  esomeprazole (NEXIUM) 40 MG capsule, Take 40 mg by mouth daily before breakfast.  , Disp: , Rfl:  .  fenofibrate 160 MG tablet, Take 1 tablet (160 mg total) by mouth daily. with food, Disp: 90 tablet, Rfl: 1 .  furosemide (LASIX) 20 MG tablet, Take 1 tablet (20 mg total) by mouth every other day., Disp: 45 tablet, Rfl: 2 .  LORazepam (ATIVAN) 1 MG tablet, Take 1 mg by mouth every 8 (eight) hours., Disp: , Rfl:  .  Magnesium 250 MG TABS, Take 1 tablet by mouth daily., Disp: , Rfl:  .  metoprolol tartrate (LOPRESSOR) 25 MG tablet, TAKE 1 TABLET BY MOUTH  TWICE DAILY, Disp: 180 tablet, Rfl: 1 .  Misc Natural Products (COLON CLEANSE PO), Take by mouth., Disp: , Rfl:  .  Misc. Devices MISC, [place] brace. He has a double upright brace that is no longer enough support for the arthritis and  instability of his midfoot and ankle on his right.  It is in lieu of surgical correction., Disp: , Rfl:  .  montelukast (SINGULAIR) 10 MG tablet, Take 1 tablet (10 mg total) by mouth at bedtime., Disp: 90 tablet, Rfl: 1 .  nitroGLYCERIN (NITROSTAT) 0.4 MG  SL tablet, Place 1 tablet (0.4 mg total) under the tongue every 5 (five) minutes as needed., Disp: 25 tablet, Rfl: 3 .  Oxycodone HCl 10 MG TABS, Take 10 mg by mouth every 6 (six) hours as needed., Disp: , Rfl:  .  Spacer/Aero-Holding Chambers (AEROCHAMBER MV) inhaler, Use as instructed, Disp: 1 each, Rfl: 0 .  tamsulosin (FLOMAX) 0.4 MG CAPS capsule, Take 0.4 mg by mouth daily. , Disp: , Rfl:  .  triamcinolone (NASACORT) 55 MCG/ACT AERO nasal inhaler, 2 puffs in each nostril, Disp: , Rfl:  .  vitamin B-12 (CYANOCOBALAMIN) 1000 MCG tablet, Take 1,000 mcg by mouth daily., Disp: , Rfl:  .  zolpidem (AMBIEN) 10 MG tablet, Take 10 mg by mouth at bedtime as needed for sleep. , Disp: , Rfl:       Objective:   Vitals:   05/07/20 1130  BP: 126/78  Pulse: 71  Temp: 98.5 F (36.9 C)  TempSrc: Oral  SpO2: 94%  Weight: 278 lb (126.1 kg)  Height: 5\' 10"  (1.778 m)    Estimated body mass index is 39.89 kg/m as calculated from the following:   Height as of this encounter: 5\' 10"  (1.778 m).   Weight as of this encounter: 278 lb (126.1 kg).  @WEIGHTCHANGE @  [employer]   05/07/20 1130  Weight: 278 lb (126.1 kg)     Physical Exam Obese male.  He has a cane.  His right knee is in a brace.  Clear to auscultation bilaterally no oral thrush.  No neck nodes no elevated JVP.  Abdomen is soft visceral obesity present.     Assessment:       ICD-10-CM   1. Moderate persistent asthma without complication  G25.63   2. Mold exposure  Z77.120   3. Anxiety  F41.9        Plan:     Patient Instructions     ICD-10-CM   1. Moderate persistent asthma without complication  S93.73   2. Mold exposure  Z77.120   3. OSA (obstructive sleep apnea)   G47.33    Asthma: well controlle but albuterol use is excess and could be due to anxiety. Glad you got rid of mold at home   plan: -  continue dulera and Singulair.    - Staff will  Instruct on what needs to be done for [place] - use albuterol as needed -Meditate daily for 10 minutes to help with anxiety and symptom management -Given excess albuterol use we need to really assess if your asthma is truly out of control because clinically it seems to be well controlled.  For this  -Do blood IgE and CBC with differential today  -Next visit in 3 months   -Do high-resolution CT chest supine and prone   -Do spirometry pre and postbronchodilator and DLCO   -Do exhaled nitric oxide testing  Sleep apnea -Continue CPAP  Follow-up - 3 months to see [name] but after testing.     SIGNATURE    Dr. Brand Males, M.D., F.C.C.P,  Pulmonary and Critical Care Medicine Staff Physician, [place] Director - Interstitial Lung Disease  Program  P[facility] [place] at [place], [place], [address]  Pager: [phone], If no answer or between  15:00h - 7:00h: call [phone] Telephone: [phone]  11:57 AM 05/07/2020

## 2020-05-07 NOTE — Patient Instructions (Addendum)
ICD-10-CM   1. Moderate persistent asthma without complication  D28.97   2. Mold exposure  Z77.120   3. OSA (obstructive sleep apnea)  G47.33    Asthma: well controlle but albuterol use is excess and could be due to anxiety. Glad you got rid of mold at home   plan: -  continue dulera and Singulair.    - Staff will  Instruct on what needs to be done for [place] - use albuterol as needed -Meditate daily for 10 minutes to help with anxiety and symptom management -Given excess albuterol use we need to really assess if your asthma is truly out of control because clinically it seems to be well controlled.  For this  -Do blood IgE and CBC with differential today  -Next visit in 3 months   -Do high-resolution CT chest supine and prone   -Do spirometry pre and postbronchodilator and DLCO   -Do exhaled nitric oxide testing  Sleep apnea -Continue CPAP  Follow-up - 3 months to see [name] but after testing.

## 2020-05-07 NOTE — Addendum Note (Signed)
Addended by: Suzzanne Cloud E on: 05/07/2020 12:13 PM   Modules accepted: Orders

## 2020-05-07 NOTE — Addendum Note (Signed)
Addended by: Lorretta Harp on: 05/07/2020 12:06 PM   Modules accepted: Orders

## 2020-05-08 LAB — IGE: IgE (Immunoglobulin E), Serum: 235 kU/L — ABNORMAL HIGH (ref <=114)

## 2020-05-10 ENCOUNTER — Other Ambulatory Visit: Payer: Self-pay | Admitting: Internal Medicine

## 2020-05-20 ENCOUNTER — Telehealth: Payer: Self-pay | Admitting: *Deleted

## 2020-05-20 DIAGNOSIS — G4733 Obstructive sleep apnea (adult) (pediatric): Secondary | ICD-10-CM

## 2020-05-20 NOTE — Telephone Encounter (Signed)
What time is he taking lasix during the day

## 2020-05-20 NOTE — Telephone Encounter (Signed)
No patient never sleeps on his back. Patient has kidney disease and goes to the bathroom frequently and he is just too sleepy. He is up all night long per his wife.

## 2020-05-20 NOTE — Telephone Encounter (Signed)
-----   Message from [name], MD sent at 05/20/2020 12:25 PM EDT ----- Regarding: RE: [name]  for FRIDAY 6/25 APPOINTMENT AHi is elevated - please find out if he is sleeping supine.  Also compliance remains low - please find out why he is noncompliant  [name] ----- Message ----- From: Freada Bergeron, CMA Sent: 05/20/2020  12:07 PM EDT To: [name], MD Subject: [name]  for FRIDAY 6/25 APPOINTMENT               Keith Bernard, Rybicki 02/20/2020 - 05/19/2020 Patient ID: [id number] DOB: 02/17/1946 Age: 25 years 30.5 [place] (Therapist) [place], [id number] Compliance Report Usage 02/20/2020 - 05/19/2020 Usage days 87/90 days (97%) >= 4 hours 14 days (16%) < 4 hours 73 days (81%) Usage hours 229 hours 20 minutes Average usage (total days) 2 hours 33 minutes Average usage (days used) 2 hours 38 minutes Median usage (days used) 2 hours 25 minutes S9 VPAP Auto Serial number [id number] Mode VAuto Max IPAP 20 cmH2O Min EPAP 4 cmH2O Pressure Support 4 cmH2O Therapy Leaks - L/min Median: 18.1 95th percentile: 52.2 Maximum: 74.6 Events per hour AI: 14.6 HI: 0.0 AHI: 14.6 Apnea Index Central: 0.2 Obstructive: 2.0 Unknown: 12.4 Usage - hours Printed on 05/20/2020 - [employer] Air[employer] version 4.23.0-2.0 Page 1 of 1 [place], Savini 02/20/2020 - 05/19/2020 Patient ID: [id number] DOB: 07/02/1946 Age: 25 years 30.5 [place] (Therapist) [place], [address] Therapy Report S9 VPAP Auto SN: [id number] Usage (hours) Usage days 87/90 (97%) >= 4 hour days 14 (16%) < 4 hour days 73 (81%) Days not used 3 (3%) Days no data 0 (0%) Used/day (avg.) 2.6 hrs. Leak (L/min) Set threshold 24.0 L/min Maximum (avg) 74.6 95th % (avg) 52.2 Median (avg) 18.1 Pressure (cmH2O) Mode VAuto Set Max IPAP 20.0 Set Min EPAP 4.0 EPAP IPAP Maximum (avg) 6.1 10.1 95th % (avg) 5.8 9.8 Median (avg) 4.6 8.5 Pressure support 4.0 AHI  (events/hour) AHI 14.6 HI 0.0 AI 14.6 CAI 0.2 OAI 2.0 UAI 12.4 Printed on 05/20/2020 - [employer] AirView version 4.23.0-2.0 Page 1 of 2 [place], Flott 02/20/2020 - 05/19/2020 Patient ID: [id number] DOB: [birth year] Age: 72 years 30.5 [place] (Therapist) [place], [address] Therapy Report S9 VPAP Auto SN: [id number] Tidal Volume (ml) Maximum (avg) 714 95th % (avg) 468 Median (avg) 309 Respiratory Rate (breaths/min) Maximum (avg) 28 95th % (avg) 22 Median (avg) 17 Spontaneous breaths Cycled 95% Minute Ventilation (L/min) Maximum (avg) 11.6 95th % (avg) 8.0 Median (avg) 5.6 I:E Ratio (median in %) Maximum (avg) 1.36:1 95th % (avg) 1:1.22 Median (avg) 1:2.50 E% I% Printed on 05/20/2020 - [employer]View version 4.23.0-2.0 Page 2 of 2

## 2020-05-21 NOTE — Telephone Encounter (Signed)
Supplied patient with the number to [employer] to call and order his supplies and dr Radford Pax can write a mask fit order for me to send.

## 2020-05-21 NOTE — Telephone Encounter (Signed)
Patient states that he usually takes his Lasix and flomax between 10am-12pm. He drinks a lot of water due to kidney disease but states that he usually drinks more in the evenings. I advised him to try to start drinking water earlier in the day and stop 1-2 hours before bed time. Patient also states that last night he realized that he does sleep on his back and sometimes on his right side.  He states that when he gets up to go to the bathroom at night he is usually not fully awake and is sometimes unable to put his mask back on correctly.

## 2020-05-21 NOTE — Telephone Encounter (Signed)
Agree with plan.  Would take lasix as soon as he gets up in the am and not drink too much after dinner

## 2020-05-22 ENCOUNTER — Telehealth: Payer: Self-pay | Admitting: Internal Medicine

## 2020-05-22 NOTE — Telephone Encounter (Signed)
Called Vickie but there was no answer. LM for her to call back in the morning.

## 2020-05-23 ENCOUNTER — Telehealth: Payer: Self-pay | Admitting: *Deleted

## 2020-05-23 NOTE — Telephone Encounter (Signed)
-----   Message from [name], MD sent at 05/21/2020  4:11 PM EDT ----- Avoid sleeping supine and increase compliance

## 2020-05-23 NOTE — Telephone Encounter (Signed)
LMTCB x2 for pt 

## 2020-05-23 NOTE — Telephone Encounter (Signed)
Gave patient recommendations made by Dr. Radford Pax and  understanding was verbalized. Patient understands to Avoid sleeping supine and increase compliance. Pt is agreeable to treatment.

## 2020-05-24 ENCOUNTER — Other Ambulatory Visit: Payer: Self-pay

## 2020-05-24 ENCOUNTER — Encounter: Payer: Self-pay | Admitting: Cardiology

## 2020-05-24 ENCOUNTER — Telehealth (INDEPENDENT_AMBULATORY_CARE_PROVIDER_SITE_OTHER): Payer: Medicare Other | Admitting: Cardiology

## 2020-05-24 VITALS — Ht 70.0 in | Wt 268.0 lb

## 2020-05-24 DIAGNOSIS — E785 Hyperlipidemia, unspecified: Secondary | ICD-10-CM

## 2020-05-24 DIAGNOSIS — I2510 Atherosclerotic heart disease of native coronary artery without angina pectoris: Secondary | ICD-10-CM

## 2020-05-24 DIAGNOSIS — I10 Essential (primary) hypertension: Secondary | ICD-10-CM

## 2020-05-24 DIAGNOSIS — I5032 Chronic diastolic (congestive) heart failure: Secondary | ICD-10-CM

## 2020-05-24 DIAGNOSIS — I420 Dilated cardiomyopathy: Secondary | ICD-10-CM

## 2020-05-24 DIAGNOSIS — G4733 Obstructive sleep apnea (adult) (pediatric): Secondary | ICD-10-CM

## 2020-05-24 NOTE — Addendum Note (Signed)
Addended by: Antonieta Iba on: 05/24/2020 09:13 AM   Modules accepted: Orders

## 2020-05-24 NOTE — Telephone Encounter (Signed)
LMTCB x3 for pt's wife. We have attempted to contact pt's wife several times with no success or call back from her. Per triage protocol, message will be closed.   

## 2020-05-24 NOTE — Progress Notes (Signed)
Virtual Visit via Telephone Note   This visit type was conducted due to national recommendations for restrictions regarding the COVID-19 Pandemic (e.g. social distancing) in an effort to limit this patient's exposure and mitigate transmission in our community.  Due to his co-morbid illnesses, this patient is at least at moderate risk for complications without adequate follow up.  This format is felt to be most appropriate for this patient at this time.  All issues noted in this document were discussed and addressed.  A limited physical exam was performed with this format.  Please refer to the patient's chart for his consent to telehealth for [facility].   Evaluation Performed:  Follow-up visit  This visit type was conducted due to national recommendations for restrictions regarding the COVID-19 Pandemic (e.g. social distancing).  This format is felt to be most appropriate for this patient at this time.  All issues noted in this document were discussed and addressed.  No physical exam was performed (except for noted visual exam findings with Video Visits).  Please refer to the patient's chart (MyChart message for video visits and phone note for telephone visits) for the patient's consent to telehealth for [facility].  Date:  05/24/2020   ID:  Keith Bernard, DOB 05-12-1946, MRN [id number]  Patient Location:  Home  Provider location:   [place]  PCP:  [name], MD  Cardiologist: Fransico Him, MD Electrophysiologist:  None   Chief Complaint:  OSA, CAD, DCM  History of Present Illness:    Keith Bernard is a 74 y.o. male who presents via audio/video conferencing for a telehealth visit today.    Keith Bernard is a 74 y.o. 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 He has chronic LE edema from his neuropathy but this is stable and well controlled on diuretics.He has moderate obstructive sleep apnea with an AHI of 17.7/h and now on auto BiPAP.  He is here today for followup and is doing well.  He has chronic RLE from his drop foot and neuropathy that is stable.  He has some DOE when exerting himselft related to his asthma.  He is followed by pulmonary for his asthma.  He denies any chest pain or pressure,  PND, orthopnea, dizziness, palpitations or syncope. He is compliant with his meds and is tolerating meds with no SE.  He is doing well with his BiPAP device and thinks that he has gotten used to it.  He tolerates the mask and feels the pressure is adequate.  Since going on BiPAP he feels rested in the am and has no significant daytime sleepiness.  He has some problems with dry mouth when his mask does not seal well due to sweating at night.  He does not think that he snores.  His compliance was down due to getting up to go to the bathroom during the night and we recommended taking his diuretic earlier in the am and this seems to have improved things and he is now sleeping through the night and used his PAP last night for 8 hours.   The patient does not have symptoms concerning for COVID-19 infection (fever, chills, cough, or new shortness of breath).   Prior  CV studies:   The following studies were reviewed today:  Pap download for compliance  Past Medical History:  Diagnosis Date   Asthma    Chronic diastolic CHF (congestive heart failure) (HCC)    Chronic kidney disease (CKD), stage III (moderate)    Chronic pain    Coronary artery disease    S/P STEMI anterior with PCI w BMS of LAD 04/12/2009 20% Prox RCA, 30 % mid RCA 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 EF 35% but now by echo ER 55% 04/2009   Depression  05/04/2016   DJD (degenerative joint disease)    Dyslipidemia    ETOH abuse    Foot ulcer ([place]) 07/07/2017   GERD (gastroesophageal reflux disease)    Hypertension    Neuropathy    Obesity    OSA (obstructive sleep apnea)    Moderate OSA w AHI 17.7/hr now on VPAP at EEP at 7cm H2O, min PS 3cm H2O,max PS 15cm H2O   RBBB    Past Surgical History:  Procedure Laterality Date   CORONARY STENT PLACEMENT     REPLACEMENT TOTAL KNEE     left knee     Current Meds  Medication Sig   albuterol (PROAIR HFA) 108 (90 Base) MCG/ACT inhaler Inhale 2 puffs into the lungs every 6 (six) hours as needed for wheezing or shortness of breath.   albuterol (PROVENTIL) (2.5 MG/3ML) 0.083% nebulizer solution Take 3 mLs (2.5 mg total) by nebulization every 6 (six) hours as needed for wheezing or shortness of breath.   aspirin 81 MG tablet Take 81 mg by mouth daily.     atorvastatin (LIPITOR) 20 MG tablet Take 1 tablet (20 mg total) by mouth daily.   CALCIUM PO Take by mouth 2 (two) times daily.    Cholecalciferol (VITAMIN D3) 1000 units CAPS Take 1,000 Units by mouth.   CO ENZYME Q-10 PO Take by mouth daily.    DULERA 100-5 MCG/ACT AERO INHALE 2 PUFFS INTO THE LUNGS TWICE DAILY   escitalopram (LEXAPRO) 20 MG tablet Take 20 mg by mouth daily.   esomeprazole (NEXIUM) 40 MG capsule Take 20 mg by mouth daily before breakfast.    fenofibrate 160 MG tablet Take 1 tablet (160 mg total) by mouth daily. with food   furosemide (LASIX) 20 MG tablet Take 1 tablet (20 mg total) by mouth every other day.   gabapentin (NEURONTIN) 300 MG capsule Take 300 mg by mouth 3 (three) times daily.   LORazepam (ATIVAN) 1 MG tablet Take 1 mg by mouth every 8 (eight) hours.   Magnesium 250 MG TABS Take 1 tablet by mouth daily.   metoprolol tartrate (LOPRESSOR) 25 MG tablet TAKE 1 TABLET BY MOUTH  TWICE DAILY   Misc. Devices [place] brace. He has a double upright brace that is no longer enough support for  the arthritis and instability of his midfoot and ankle on his right.  It is in lieu of surgical correction.   montelukast (SINGULAIR) 10 MG tablet TAKE 1 TABLET(10 MG) BY MOUTH AT BEDTIME   nitroGLYCERIN (NITROSTAT) 0.4 MG SL tablet Place 1 tablet (0.4 mg total) under the tongue every 5 (five) minutes as needed.   Oxycodone HCl 10 MG TABS Take 10 mg by mouth every 6 (six) hours as needed.   Spacer/Aero-Holding Chambers (AEROCHAMBER MV) inhaler Use as instructed   tamsulosin (FLOMAX) 0.4 MG CAPS capsule Take 0.4 mg by mouth daily.    triamcinolone (NASACORT) 55 MCG/ACT AERO nasal inhaler  2 puffs in each nostril   vitamin B-12 (CYANOCOBALAMIN) 1000 MCG tablet Take 1,000 mcg by mouth daily.   zolpidem (AMBIEN) 10 MG tablet Take 10 mg by mouth at bedtime as needed for sleep.      Allergies:   Patient has no known allergies.   Social History   Tobacco Use   Smoking status: Never Smoker   Smokeless tobacco: Never Used  Vaping Use   Vaping Use: Never used  Substance Use Topics   Alcohol use: Yes    Alcohol/week: 0.0 standard drinks    Comment: Moderate, beer   Drug use: No     Family Hx: The patient's family history includes COPD in his mother; Other in his cousin; Pancreatic cancer in his father.  ROS:   Please see the history of present illness.     All other systems reviewed and are negative.   Labs/Other Tests and Data Reviewed:    Recent Labs: 01/12/2020: ALT 13; BUN 14; Creatinine 0.83; Potassium 4.2; Sodium 139 05/07/2020: Hemoglobin 11.9; Platelets 231.0   Recent Lipid Panel Lab Results  Component Value Date/Time   CHOL 124 05/03/2019 11:21 AM   TRIG 110 05/03/2019 11:21 AM   HDL 59 05/03/2019 11:21 AM   CHOLHDL 2.1 05/03/2019 11:21 AM   CHOLHDL 1.9 05/04/2016 10:29 AM   LDLCALC 43 05/03/2019 11:21 AM   LDLDIRECT 33.0 05/07/2015 12:33 PM    Wt Readings from Last 3 Encounters:  05/24/20 268 lb (121.6 kg)  05/07/20 278 lb (126.1 kg)  01/19/20 279 lb  11.2 oz (126.9 kg)     Objective:    Vital Signs:  Ht 5\' 10"  (1.778 m)    Wt 268 lb (121.6 kg)    BMI 38.45 kg/m     ASSESSMENT & PLAN:    1.  ASCHD  -status post anterior MI with PCI with bare-metal stent of LAD on 04/12/2009.  Cath at that time also showed nonobstructive disease with 20% proximal RCA and 30% mid RCA.   -he denies any anginal symptoms -continue Lopressor 25mg  BID, ASA and statin   2.  Hypertension  -continue Lopressor 25mg  BID  3.  Dilated cardiomyopathy  -this is felt to be mixed ischemic/nonischemic.   -His EF normalized by echo in 2000 1050 to 55% and then 71% by stress Myoview on 2013.   -His most recent echo 10/19/2018 showed normal LV function with EF 55 to 60% with grade 2 diastolic dysfunction.  -continue BB  4.  Hyperlipidemia  -his LDL goal is less than 70.  -His LDL year ago was 43 -check FLP and ALT -continue Atorvastatin 20mg  daily and fenofibrate 160mg  daily  5.  OSA - the patient is tolerating PAP therapy well without any problems.The patient has been using and benefiting from PAP use and will continue to benefit from therapy. His last download showed increased AHI of  14.6/hr and 16% compliance in using more than 4 hours nightly.  He has been having night sweats and his mask has not been sealing well so he gets a mask leak.  His mouth is dry the nights he sweats likely related to it not sealing due to moisture. I have asked him to let his PCP know that he is having night sweats which are not normal.  His compliance has already improved after he started taking his diuretic earlier in the am so he is not getting up at night to use the restroom.     6.  Chronic diastolic  CHF -he denies any SOB  -he has chronic RLE edema due to neuropathy which is stable. -2D echocardiogram done last fall showed normal LV function.   -continue Lasix 20mg  QOD -creatinine was stable at 0.83 in Feb 2021   Time:   Today, I have spent 20 minutes on telemedicine  discussing medical problems including CAD, OSA, HTN, lipids, CHF reviewing patient's chart including labs and PAP download  Medication Adjustments/Labs and Tests Ordered: Current medicines are reviewed at length with the patient today.  Concerns regarding medicines are outlined above.  Tests Ordered: No orders of the defined types were placed in this encounter.  Medication Changes: No orders of the defined types were placed in this encounter.   Disposition:  Follow up in 6 month(s)  Signed, Fransico Him, MD  05/24/2020 8:54 AM    [facility]

## 2020-05-24 NOTE — Patient Instructions (Signed)
Medication Instructions:  Your physician recommends that you continue on your current medications as directed. Please refer to the Current Medication list given to you today.  *If you need a refill on your cardiac medications before your next appointment, please call your pharmacy*  Lab Work: Fasting lipids and ALT If you have labs (blood work) drawn today and your tests are completely normal, you will receive your results only by: Marland Kitchen MyChart Message (if you have MyChart) OR . A paper copy in the mail If you have any lab test that is abnormal or we need to change your treatment, we will call you to review the results.   Follow-Up: At [facility], you and your health needs are our priority.  As part of our continuing mission to provide you with exceptional heart care, we have created designated Provider Care Teams.  These Care Teams include your primary Cardiologist (physician) and Advanced Practice Providers (APPs -  Physician Assistants and Nurse Practitioners) who all work together to provide you with the care you need, when you need it.  Your next appointment:   6 month(s)  The format for your next appointment:   Either In Person or Virtual  Provider:   Fransico Him, MD

## 2020-05-27 NOTE — Telephone Encounter (Signed)
Mask fit order sent via community message.

## 2020-05-29 ENCOUNTER — Other Ambulatory Visit: Payer: Medicare Other

## 2020-05-31 ENCOUNTER — Other Ambulatory Visit: Payer: Medicare Other

## 2020-06-05 ENCOUNTER — Other Ambulatory Visit: Payer: Medicare Other | Admitting: *Deleted

## 2020-06-05 ENCOUNTER — Other Ambulatory Visit: Payer: Self-pay

## 2020-06-05 DIAGNOSIS — E785 Hyperlipidemia, unspecified: Secondary | ICD-10-CM

## 2020-06-06 ENCOUNTER — Other Ambulatory Visit: Payer: Self-pay | Admitting: Cardiology

## 2020-06-06 LAB — LIPID PANEL
Chol/HDL Ratio: 2.3 ratio (ref 0.0–5.0)
Cholesterol, Total: 115 mg/dL (ref 100–199)
HDL: 50 mg/dL (ref >39)
LDL Chol Calc (NIH): 42 mg/dL (ref 0–99)
Triglycerides: 134 mg/dL (ref 0–149)
VLDL Cholesterol Cal: 23 mg/dL (ref 5–40)

## 2020-06-11 ENCOUNTER — Other Ambulatory Visit: Payer: Self-pay | Admitting: Internal Medicine

## 2020-06-12 ENCOUNTER — Other Ambulatory Visit: Payer: Self-pay

## 2020-06-12 MED ORDER — METOPROLOL TARTRATE 25 MG PO TABS: 25.0000 mg | ORAL_TABLET | Freq: Two times a day (BID) | ORAL | 3 refills | Status: DC

## 2020-06-13 ENCOUNTER — Other Ambulatory Visit: Payer: Self-pay | Admitting: Cardiology

## 2020-06-13 ENCOUNTER — Telehealth: Payer: Self-pay | Admitting: Internal Medicine

## 2020-06-13 NOTE — Telephone Encounter (Signed)
Called and spoke with pt's wife Vickie letting her know that we no longer get samples of Dulera. Stated to her after pt's PFT 7/21, it can be discussed to see if provider wants to switch pt to a different inhaler and she verbalized understanding. Scheduled pt a televisit with SG 7/21 at 3:30 to go over results of PFT. Nothing further needed.

## 2020-06-17 ENCOUNTER — Ambulatory Visit: Payer: Medicare Other | Admitting: Cardiology

## 2020-06-19 ENCOUNTER — Encounter: Payer: Self-pay | Admitting: Acute Care

## 2020-06-19 ENCOUNTER — Ambulatory Visit (INDEPENDENT_AMBULATORY_CARE_PROVIDER_SITE_OTHER): Payer: Medicare Other | Admitting: Internal Medicine

## 2020-06-19 ENCOUNTER — Ambulatory Visit (INDEPENDENT_AMBULATORY_CARE_PROVIDER_SITE_OTHER): Payer: Medicare Other | Admitting: Acute Care

## 2020-06-19 ENCOUNTER — Other Ambulatory Visit: Payer: Self-pay

## 2020-06-19 DIAGNOSIS — J4540 Moderate persistent asthma, uncomplicated: Secondary | ICD-10-CM

## 2020-06-19 DIAGNOSIS — G4733 Obstructive sleep apnea (adult) (pediatric): Secondary | ICD-10-CM | POA: Diagnosis not present

## 2020-06-19 DIAGNOSIS — J45909 Unspecified asthma, uncomplicated: Secondary | ICD-10-CM | POA: Diagnosis not present

## 2020-06-19 DIAGNOSIS — Z9989 Dependence on other enabling machines and devices: Secondary | ICD-10-CM

## 2020-06-19 LAB — PULMONARY FUNCTION TEST
DL/VA % pred: 123 %
DL/VA: 4.96 ml/min/mmHg/L
DLCO cor % pred: 86 %
DLCO cor: 20.59 ml/min/mmHg
DLCO unc % pred: 79 %
DLCO unc: 18.83 ml/min/mmHg
FEF 25-75 Post: 2.21 L/sec
FEF 25-75 Pre: 1.4 L/sec
FEF2575-%Change-Post: 57 %
FEF2575-%Pred-Post: 105 %
FEF2575-%Pred-Pre: 67 %
FEV1-%Change-Post: 10 %
FEV1-%Pred-Post: 71 %
FEV1-%Pred-Pre: 64 %
FEV1-Post: 2.04 L
FEV1-Pre: 1.85 L
FEV1FVC-%Change-Post: 4 %
FEV1FVC-%Pred-Pre: 103 %
FEV6-%Change-Post: 6 %
FEV6-%Pred-Post: 69 %
FEV6-%Pred-Pre: 65 %
FEV6-Post: 2.55 L
FEV6-Pre: 2.41 L
FEV6FVC-%Change-Post: 0 %
FEV6FVC-%Pred-Post: 105 %
FEV6FVC-%Pred-Pre: 105 %
FVC-%Change-Post: 5 %
FVC-%Pred-Post: 65 %
FVC-%Pred-Pre: 62 %
FVC-Post: 2.58 L
FVC-Pre: 2.44 L
Post FEV1/FVC ratio: 79 %
Post FEV6/FVC ratio: 99 %
Pre FEV1/FVC ratio: 75 %
Pre FEV6/FVC Ratio: 98 %
RV % pred: 101 %
RV: 2.45 L
TLC % pred: 71 %
TLC: 4.78 L

## 2020-06-19 NOTE — Progress Notes (Signed)
PFT done today. 

## 2020-06-19 NOTE — Patient Instructions (Addendum)
It was good to talk with you today. I am glad your breathing  is better Continue dulera and Singulair as you have been doing as maintenance ( every day without feel ) . Rinse mouth after use   use albuterol as needed for shortness of breath or wheezing up to 3 times daily. Call for an appointment if you have an increased need for albuterol. -Meditate daily for 10 minutes to help with anxiety and symptom management. Please call your insurance company and see what the preferred ICS/LABA is for your plan. Let us know what this is and we will call in a prescription. If there is a Programmer, applications, we will give you paperwork to complete.  If there is no better option cost wise, we will do a therapeutic trial on Breo, as we do have samples of this medication. . There is a Programmer, applications for [employer].  Follow up with [name] 07/2020 with FENO and HRCT prior Please contact office for sooner follow up if symptoms do not improve or worsen or seek emergency care

## 2020-06-19 NOTE — Progress Notes (Signed)
Virtual Visit via Video Note  I connected with Keith Bernard on 06/19/20 at  3:30 PM EDT by a video enabled telemedicine application and verified that I am speaking with the correct person using two identifiers.  Location: Patient: At home Provider: [address], [place], [place], [address]   I discussed the limitations of evaluation and management by telemedicine and the availability of in person appointments. The patient expressed understanding and agreed to proceed.  History of Present Illness: Pt. Presents for follow up>> review of PFT's shows no  obstruction, DLCO is better than 2016 PFT's.  No significant BD response. Flow curves look similar to 2016.  He states his breathing is much better at present. He states he is not using his rescue inhaler for the last month.. He has not been wheezing.He has tried to do what [name] said, and use it less.  He has been started on gabapentin for neuropathies, which may also be helping with anxiety.   He states the pollen has not been bothering him as much. He has been taking magnesium and zinc daily. He is compliant with his Singulair. He is using his Dulera twice daily most days, but he did not take it this morning.  The cost of Ruthe Mannan is an issue. He is asking for samples which we do not have. He states he did try Breo in the past, but did not like the powder formulation.  ( We have Breo samples)  He states he has no fever, chest pain, orthopnea or hemoptysis. Over all is better.  IgE was  elevated in June which could be the reason for his flare at that time.  We would need to consider number of flares a year and determine risk benefit ratio for biologic use.( in addition to cost) Cost of Ruthe Mannan is a big issue for the patient. He is currently in the donut hole.  Ruthe Mannan does not have a financial assistance program at present.  I have asked him to call his insurance company and see what preferred ICS     Observations/Objective: 05/07/2020>> IgE is 235>> may be cause of flare 04/2020 05/07/2020 Eosinophils>> 2.5 FENO>> pending HRCT pending       Assessment and Plan:Resolved asthma flare IgE was elevated at time of flare Over all better control of asthma  Plan Your PFT's are relatively normal, similar to 2016 Continue dulera and Singulair as you have been doing as maintenance ( every day without feel ) . Rinse mouth after use   use albuterol as needed for shortness of breath or wheezing up to 3 times daily. Call for an appointment if you have an increased need for albuterol. -Meditate daily for 10 minutes to help with anxiety and symptom management. Please call your insurance company and see what the preferred ICS/LABA is for your plan. Let us know what this is and we will call in a prescription. If there is a Programmer, applications, we will give you paperwork to complete.  If there is no better option cost wise, we will do a therapeutic trial on Breo, as we do have samples of this medication. . There is a Programmer, applications for [employer].  Follow up with [name] 07/2020 with FENO and HRCT prior Consider allergy testing/panel Can consider biologic only if increased frequency of flares.  Continue wearing CPAP every night. Goal is to wear for at least 4 hours each night. Please contact office for sooner follow up if symptoms do not  improve or worsen or seek emergency care     Follow Up Instructions: Follow up with [name] 07/2020 with FENO and HRCT prior Please contact office for sooner follow up if symptoms do not improve or worsen or seek emergency care    I discussed the assessment and treatment plan with the patient. The patient was provided an opportunity to ask questions and all were answered. The patient agreed with the plan and demonstrated an understanding of the instructions.   The patient was advised to call back or seek an in-person evaluation if  the symptoms worsen or if the condition fails to improve as anticipated.  I provided 40 minutes of non-face-to-face time during this encounter.   Magdalen Spatz, NP 06/19/2020 4:34 PM

## 2020-06-20 ENCOUNTER — Other Ambulatory Visit: Payer: Self-pay | Admitting: Acute Care

## 2020-06-20 ENCOUNTER — Telehealth: Payer: Self-pay | Admitting: Internal Medicine

## 2020-06-20 DIAGNOSIS — R0602 Shortness of breath: Secondary | ICD-10-CM

## 2020-06-20 NOTE — Telephone Encounter (Signed)
Patient was asked by SG to call insurance company to see what other medications they would pay for. They said there were no other medications. Calling to see what other medications he can try, needs one with a patient assistance program. Please advise. [phone]

## 2020-06-20 NOTE — Telephone Encounter (Signed)
Spoke with the pt's spouse  She states was not given alternative meds that his insurance will cover for [facility]  Will route to the pharmacy team to see if they can help with any advice  Please advise thanks

## 2020-06-21 NOTE — Telephone Encounter (Signed)
Findings of benefits investigation via test claims at [facility]:   Insurance: [employer] Medicare D plan   Ruthe Mannan - # 1 inhaler for a 1 month supply through patient's insurance is $ 47.00.  Symbicort - # 1 inhaler for a 1 month supply through patient's insurance is $ 47.00. Generic is not covered by plan.  Advair HFA - # 1 inhaler for a 1 month supply through patient's insurance is $ 47.00.  Advair Diskus - # 1 inhaler for a 1 month supply through patient's insurance is $ 47.00. Wixela has the same copay  Breo - # 1 inhaler for a 1 month supply through patient's insurance is $ 47.00.  Airduo- is nonformulary  Patient can apply for patient assistance through [place] or [place] and [place], depending on the chosen inhaler. *[facility] does require patient's household to have spent $600 OOP on prescriptions in the calendar year.  9:31 AM Beatriz Chancellor, CPhT

## 2020-06-21 NOTE — Telephone Encounter (Signed)
LMTCB

## 2020-06-24 NOTE — Telephone Encounter (Signed)
LMTCB x2 for pt 

## 2020-06-25 NOTE — Telephone Encounter (Signed)
Attempted to call pt but unable to reach.  Unable to leave a VM. Due to multiple attempts trying to reach pt without being able to do so, per triage protocol encounter will be closed.

## 2020-07-01 ENCOUNTER — Other Ambulatory Visit: Payer: Self-pay

## 2020-07-01 ENCOUNTER — Other Ambulatory Visit: Payer: Self-pay | Admitting: Internal Medicine

## 2020-07-01 MED ORDER — FUROSEMIDE 20 MG PO TABS: 20.0000 mg | ORAL_TABLET | ORAL | 3 refills | Status: DC

## 2020-07-02 ENCOUNTER — Other Ambulatory Visit: Payer: Self-pay

## 2020-07-02 MED ORDER — DULERA 100-5 MCG/ACT IN AERO: 2.0000 | INHALATION_SPRAY | Freq: Two times a day (BID) | RESPIRATORY_TRACT | 3 refills | Status: DC

## 2020-07-24 NOTE — Telephone Encounter (Signed)
Pt requesting to know results of PFT. MR, please advise.

## 2020-07-24 NOTE — Telephone Encounter (Signed)
Shows moderate coopd/asthma features - good nes stable./better since 2016   PFT Results Latest Ref Rng & Units 06/19/2020 10/17/2015  FVC-Pre L 2.44 2.22  FVC-Predicted Pre % 62 54  FVC-Post L 2.58 2.34  FVC-Predicted Post % 65 57  Pre FEV1/FVC % % 75 75  Post FEV1/FCV % % 79 80  FEV1-Pre L 1.85 1.66  FEV1-Predicted Pre % 64 55  FEV1-Post L 2.04 1.88  DLCO uncorrected ml/min/mmHg 18.83 19.62  DLCO UNC% % 79 66  DLCO corrected ml/min/mmHg 20.59 -  DLCO COR %Predicted % 86 -  DLVA Predicted % 123 121  TLC L 4.78 4.62  TLC % Predicted % 71 69  RV % Predicted % 101 98

## 2020-08-12 ENCOUNTER — Other Ambulatory Visit: Payer: Self-pay

## 2020-08-12 ENCOUNTER — Ambulatory Visit (HOSPITAL_COMMUNITY)
Admission: RE | Admit: 2020-08-12 | Discharge: 2020-08-12 | Disposition: A | Discharge status: Home / Self Care | Payer: Medicare Other | Source: Ambulatory Visit | Attending: Internal Medicine | Admitting: Internal Medicine

## 2020-08-12 DIAGNOSIS — R0602 Shortness of breath: Secondary | ICD-10-CM | POA: Insufficient documentation

## 2020-08-19 ENCOUNTER — Other Ambulatory Visit: Payer: Self-pay | Admitting: Cardiology

## 2020-10-02 ENCOUNTER — Telehealth: Payer: Self-pay | Admitting: Internal Medicine

## 2020-10-02 NOTE — Telephone Encounter (Signed)
Lm for patient.  

## 2020-10-04 NOTE — Telephone Encounter (Signed)
ATC patient x3 and received busy dial. Will call back.

## 2020-10-07 NOTE — Telephone Encounter (Signed)
Called and spoke with pt's wife Vickie in regards to call. Stated to her that she needs to contact insurance company to have them provide her a formulary list of covered inhalers so provider could view. She stated that she had contacted insurance company but could not provide me with the preferred inhalers.  Stated to her that pt was due for a f/u and I have scheduled that appt. Stated to Rogers that inhalers could be discussed at that visit and she verbalized understanding. Nothing further needed.

## 2020-10-09 ENCOUNTER — Encounter: Payer: Self-pay | Admitting: Pulmonary Disease

## 2020-10-09 ENCOUNTER — Other Ambulatory Visit: Payer: Self-pay

## 2020-10-09 ENCOUNTER — Ambulatory Visit (INDEPENDENT_AMBULATORY_CARE_PROVIDER_SITE_OTHER): Payer: Medicare Other | Admitting: Pulmonary Disease

## 2020-10-09 VITALS — BP 154/84 | HR 69 | Temp 97.7°F | Ht 69.0 in | Wt 288.8 lb

## 2020-10-09 DIAGNOSIS — R0602 Shortness of breath: Secondary | ICD-10-CM | POA: Diagnosis not present

## 2020-10-09 DIAGNOSIS — I5032 Chronic diastolic (congestive) heart failure: Secondary | ICD-10-CM

## 2020-10-09 DIAGNOSIS — J849 Interstitial pulmonary disease, unspecified: Secondary | ICD-10-CM

## 2020-10-09 DIAGNOSIS — J4550 Severe persistent asthma, uncomplicated: Secondary | ICD-10-CM

## 2020-10-09 DIAGNOSIS — R918 Other nonspecific abnormal finding of lung field: Secondary | ICD-10-CM | POA: Diagnosis not present

## 2020-10-09 DIAGNOSIS — Z79899 Other long term (current) drug therapy: Secondary | ICD-10-CM

## 2020-10-09 DIAGNOSIS — Z0000 Encounter for general adult medical examination without abnormal findings: Secondary | ICD-10-CM | POA: Insufficient documentation

## 2020-10-09 DIAGNOSIS — G4733 Obstructive sleep apnea (adult) (pediatric): Secondary | ICD-10-CM

## 2020-10-09 DIAGNOSIS — Z6841 Body Mass Index (BMI) 40.0 and over, adult: Secondary | ICD-10-CM

## 2020-10-09 MED ORDER — BREZTRI AEROSPHERE 160-9-4.8 MCG/ACT IN AERO: 2.0000 | INHALATION_SPRAY | Freq: Two times a day (BID) | RESPIRATORY_TRACT | 0 refills | Status: DC

## 2020-10-09 NOTE — Patient Instructions (Addendum)
You were seen today by Lauraine Rinne, NP  for:   1. SOB (shortness of breath)  Believe your shortness of breath is likely due to running out of your inhalers.  Also need to monitor for fluid retention given your weight increases.  Please schedule follow-up with cardiology to further evaluate  We will see you back closely with a follow-up with [name] in a 30-minute time slot  Please start the inhaler samples provided today as outlined below:  2. Severe persistent chronic asthma without complication  Start Breztri >>> 2 puffs in the morning right when you wake up, rinse out your mouth after use, 12 hours later 2 puffs, rinse after use >>> Take this daily, no matter what >>> This is not a rescue inhaler   Stop Dulera 200  Please notify our office how you are doing with these inhalers after you finish the first 2 inhalers  Only use your albuterol as a rescue medication to be used if you can't catch your breath by resting or doing a relaxed purse lip breathing pattern.  - The less you use it, the better it will work when you need it. - Ok to use up to 2 puffs  every 4 hours if you must but call for immediate appointment if use goes up over your usual need - Don't leave home without it !!  (think of it like the spare tire for your car)    3. Medication management  Please complete patient assistance program for [employer] to see if you qualify for coverage through this program as you are in the donut hole  4. Abnormal findings on diagnostic imaging of lung  We will repeat a high-resolution CT of your chest in September/2022  Please complete the ILD questionnaire that was handed to you today  Please bring to next appointment  5. Chronic diastolic heart failure ([place]) 6. Obstructive sleep apnea  Please schedule a follow-up appointment with Dr. Radford Pax as she manages your obstructive sleep apnea  Please discuss with her that you are struggling with obtaining high-quality  sleep as well as compliance with using your BiPAP  We recommend that you continue using your BIPAP daily >>>Keep up the hard work using your device >>> Goal should be wearing this for the entire night that you are sleeping, at least 4 to 6 hours  Remember:  . Do not drive or operate heavy machinery if tired or drowsy.  . Please notify the supply company and office if you are unable to use your device regularly due to missing supplies or machine being broken.  . Work on maintaining a healthy weight and following your recommended nutrition plan  . Maintain proper daily exercise and movement  . Maintaining proper use of your device can also help improve management of other chronic illnesses such as: Blood pressure, blood sugars, and weight management.   BiPAP/ CPAP Cleaning:  >>>Clean weekly, with Dawn soap, and bottle brush.  Set up to air dry. >>> Wipe mask out daily with wet wipe or towelette  =  7. Class 3 severe obesity due to excess calories with serious comorbidity and body mass index (BMI) of 40.0 to 44.9 in adult [facility])  Continue to work with primary care to work to reduce your BMI  8. Healthcare maintenance  Obtain COVID-19 booster vaccine    Follow Up:    Return in about 6 weeks (around 11/20/2020), or if symptoms worsen or fail to improve, for Follow up with Dr.  Ramaswammy, ILD clinic - 2min slot.   Notification of test results are managed in the following manner: If there are  any recommendations or changes to the  plan of care discussed in office today,  we will contact you and let you know what they are. If you do not hear from [place], then your results are normal and you can view them through your  MyChart account , or a letter will be sent to you. Thank you again for trusting [place] with your care  - Thank you, [facility]    It is flu season:   >>> Best ways to protect herself from the flu: Receive the yearly flu vaccine, practice good hand hygiene washing with  soap and also using hand sanitizer when available, eat a nutritious meals, get adequate rest, hydrate appropriately       Please contact the office if your symptoms worsen or you have concerns that you are not improving.   Thank you for choosing [facility] for your healthcare, and for allowing [place] to partner with you on your healthcare journey. I am thankful to be able to provide care to you today.   Wyn Quaker FNP-C

## 2020-10-09 NOTE — Progress Notes (Signed)
@Patient  ID: Keith Bernard, male    DOB: 05-27-1946, 74 y.o.   MRN: [id number]  Chief Complaint  Patient presents with  . Follow-up    SOB increased, wheezing    Referring provider: [name], MD  HPI:  74 year old male never smoker found our office for asthma and obstructive sleep apnea  PMH: Hypertension, right bundle branch block, dyslipidemia, diastolic heart failure, CAD Smoker/ Smoking History: Never smoker Maintenance: Dulera 200 Pt of: [name]  10/10/2020  - Visit   74 year old male never smoker followed in our office for obstructive sleep apnea and asthma.  Last seen in our office in July/2021 which was a virtual visit with SG NP.  Plan of care there showed stable pulmonary function testing.  Patient was encouraged to remain on Dulera.  Is recommended to have follow-up with [name] in September/2021 with a FeNO as well as a high-resolution CT chest.   Patient reports has had worsening shortness of breath over the last 2 to 3 weeks.  Unfortunately he is out of [place].  He is in the donut hole.  He reports that he has been out of [facility] since 10/04/2020.  Even before that he was tapering it down with subtherapeutic dosing for about a week before running out.  He was qualified for the patient assistance program with [employer].  This is been canceled.  He is up-to-date with COVID-19 vaccinations.  He is also noticed that he has had weight increases.  He reports adherence to using his BiPAP.  Spouse who is with him today reports that he could do better.  This is managed by Dr. Radford Pax.  He also reports that he has poor sleep quality.  Napping a lot during the day.  He is not using the BiPAP when he naps.  Questionaires / Pulmonary Flowsheets:   ACT:  Asthma Control Test ACT Total Score  05/07/2020 14    MMRC: No flowsheet data found.  Epworth:  No flowsheet data found.  Tests:   08/12/2020-high-resolution CT chest-moderate elevation of left hemidiaphragm  with passive left lung atelectasis, probably due to diaphragmatic eventration, faint patchy groundglass opacity in the peripheral perilobular right lung base, nonspecific with differential including nonspecific minimal postinfectious/postinflammatory scarring versus very mild interstitial lung disease such as NSIP, consider follow-up high-resolution CT chest imaging in 12 months, findings are suggestive of an alternative diagnosis not UIP per consensus guidelines  06/19/2020-pulmonary function test-FVC 2.44 (62% predicted), postbronchodilator ratio 79, postbronchodilator FEV1 2.04 (71% predicted), no bronchodilator response, DLCO 18.83 (79% predicted)  04/03/2013-sleep study-RDI 17.2, moderate obstructive sleep apnea  10/11/2018-echocardiogram-LV ejection fraction is 55 to 38%, grade 2 diastolic dysfunction  06/03/6432-IRJ-188 05/07/2020-CBC with differential-eosinophils relative 2.5, eosinophils absolute 0.2  FENO:  Lab Results  Component Value Date   NITRICOXIDE 11 11/11/2018    PFT: PFT Results Latest Ref Rng & Units 06/19/2020 10/17/2015  FVC-Pre L 2.44 2.22  FVC-Predicted Pre % 62 54  FVC-Post L 2.58 2.34  FVC-Predicted Post % 65 57  Pre FEV1/FVC % % 75 75  Post FEV1/FCV % % 79 80  FEV1-Pre L 1.85 1.66  FEV1-Predicted Pre % 64 55  FEV1-Post L 2.04 1.88  DLCO uncorrected ml/min/mmHg 18.83 19.62  DLCO UNC% % 79 66  DLCO corrected ml/min/mmHg 20.59 -  DLCO COR %Predicted % 86 -  DLVA Predicted % 123 121  TLC L 4.78 4.62  TLC % Predicted % 71 69  RV % Predicted % 101 98    WALK:  No  flowsheet data found.  Imaging: No results found.  Lab Results:  CBC    Component Value Date/Time   WBC 7.4 05/07/2020 1213   RBC 3.75 (L) 05/07/2020 1213   HGB 11.9 (L) 05/07/2020 1213   HGB 12.7 (L) 07/05/2018 1135   HGB 14.1 10/28/2017 1602   HCT 35.9 (L) 05/07/2020 1213   HCT 43.8 10/28/2017 1602   PLT 231.0 05/07/2020 1213   PLT 178 07/05/2018 1135   PLT 201 10/28/2017 1602   MCV  95.7 05/07/2020 1213   MCV 102.6 (H) 10/28/2017 1602   MCH 31.2 01/12/2020 1210   MCHC 33.1 05/07/2020 1213   RDW 13.4 05/07/2020 1213   RDW 13.5 10/28/2017 1602   LYMPHSABS 1.4 05/07/2020 1213   LYMPHSABS 1.4 10/28/2017 1602   MONOABS 0.7 05/07/2020 1213   MONOABS 1.0 (H) 10/28/2017 1602   EOSABS 0.2 05/07/2020 1213   EOSABS 0.1 10/28/2017 1602   BASOSABS 0.1 05/07/2020 1213   BASOSABS 0.0 10/28/2017 1602    BMET    Component Value Date/Time   NA 139 01/12/2020 1210   NA 138 05/03/2019 1121   NA 139 10/28/2017 1602   K 4.2 01/12/2020 1210   K 4.3 10/28/2017 1602   CL 101 01/12/2020 1210   CO2 27 01/12/2020 1210   CO2 26 10/28/2017 1602   GLUCOSE 94 01/12/2020 1210   GLUCOSE 99 10/28/2017 1602   BUN 14 01/12/2020 1210   BUN 14 05/03/2019 1121   BUN 16.3 10/28/2017 1602   CREATININE 0.83 01/12/2020 1210   CREATININE 1.0 10/28/2017 1602   CALCIUM 9.0 01/12/2020 1210   CALCIUM 9.3 10/28/2017 1602   GFRNONAA >60 01/12/2020 1210   GFRAA >60 01/12/2020 1210    BNP No results found for: BNP  ProBNP    Component Value Date/Time   PROBNP 367 10/17/2018 1603   PROBNP 232.0 (H) 09/27/2018 1358    Specialty Problems      Pulmonary Problems   Obstructive sleep apnea   Asthma, chronic   SOB (shortness of breath)      No Known Allergies  Immunization History  Administered Date(s) Administered  . DTaP 07/22/2001  . Influenza Split 09/30/2014  . Influenza, High Dose Seasonal PF 11/04/2016, 08/17/2017, 11/03/2018, 09/08/2019  . Influenza, Seasonal, Injecte, Preservative Fre 08/30/2017  . Influenza,inj,Quad PF,6+ Mos 10/23/2015  . Influenza-Unspecified 08/30/2017  . Moderna SARS-COVID-2 Vaccination 02/08/2020, 03/09/2020  . Pneumococcal Conjugate-13 01/02/2015, 10/01/2015  . Pneumococcal Polysaccharide-23 04/11/2012  . Pneumococcal-Unspecified 04/11/2012  . Tdap 12/22/2016  . Zoster 01/02/2015  . Zoster Recombinat (Shingrix) 07/21/2017, 01/20/2018    Past  Medical History:  Diagnosis Date  . Asthma   . Chronic diastolic CHF (congestive heart failure) ([place])   . Chronic kidney disease (CKD), stage III (moderate) (HCC)   . Chronic pain   . 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 Depression 05/04/2016  . DJD (degenerative joint disease)   . Dyslipidemia   . ETOH abuse   . Foot ulcer ([place]) 07/07/2017  . GERD (gastroesophageal reflux disease)   . Hypertension   . Neuropathy   . Obesity   . OSA (obstructive sleep apnea)    Moderate OSA w AHI 17.7/hr now on VPAP at EEP at 7cm H2O, min PS 3cm H2O,max PS 15cm H2O  . RBBB  Tobacco History: Social History   Tobacco Use  Smoking Status Never Smoker  Smokeless Tobacco Never Used   Counseling given: Yes   Continue to not smoke  Outpatient Encounter Medications as of 10/09/2020  Medication Sig  . albuterol (PROAIR HFA) 108 (90 Base) MCG/ACT inhaler Inhale 2 puffs into the lungs every 6 (six) hours as needed for wheezing or shortness of breath.  Marland Kitchen albuterol (PROVENTIL) (2.5 MG/3ML) 0.083% nebulizer solution Take 3 mLs (2.5 mg total) by nebulization every 6 (six) hours as needed for wheezing or shortness of breath.  Marland Kitchen aspirin 81 MG tablet Take 81 mg by mouth daily.    Marland Kitchen atorvastatin (LIPITOR) 20 MG tablet TAKE 1 TABLET BY MOUTH  DAILY  . CALCIUM PO Take by mouth 2 (two) times daily.   . Cholecalciferol (VITAMIN D3) 1000 units CAPS Take 1,000 Units by mouth.  . CO ENZYME Q-10 PO Take by mouth daily.   Marland Kitchen escitalopram (LEXAPRO) 20 MG tablet Take 20 mg by mouth daily.  Marland Kitchen esomeprazole (NEXIUM) 40 MG capsule Take 20 mg by mouth daily before breakfast.   . fenofibrate 160 MG tablet TAKE 1 TABLET BY MOUTH  DAILY WITH FOOD  . furosemide (LASIX) 20 MG tablet Take 1 tablet (20 mg total) by mouth every other day.  . gabapentin (NEURONTIN) 300 MG  capsule Take 300 mg by mouth 3 (three) times daily.  Marland Kitchen LORazepam (ATIVAN) 1 MG tablet Take 1 mg by mouth every 8 (eight) hours.  . Magnesium 250 MG TABS Take 1 tablet by mouth daily.  . metoprolol tartrate (LOPRESSOR) 25 MG tablet TAKE 1 TABLET BY MOUTH  TWICE DAILY  . Misc Natural Products (COLON CLEANSE PO) Take by mouth.   . Misc. Devices [place] brace. He has a double upright brace that is no longer enough support for the arthritis and instability of his midfoot and ankle on his right.  It is in lieu of surgical correction.  . mometasone-formoterol (DULERA) 100-5 MCG/ACT AERO Inhale 2 puffs into the lungs 2 (two) times daily.  . montelukast (SINGULAIR) 10 MG tablet TAKE 1 TABLET BY MOUTH AT  BEDTIME  . nitroGLYCERIN (NITROSTAT) 0.4 MG SL tablet Place 1 tablet (0.4 mg total) under the tongue every 5 (five) minutes as needed.  . Oxycodone HCl 10 MG TABS Take 10 mg by mouth every 6 (six) hours as needed.  Marland Kitchen Spacer/Aero-Holding Chambers (AEROCHAMBER MV) inhaler Use as instructed  . tamsulosin (FLOMAX) 0.4 MG CAPS capsule Take 0.4 mg by mouth daily.   Marland Kitchen triamcinolone (NASACORT) 55 MCG/ACT AERO nasal inhaler 2 puffs in each nostril  . vitamin B-12 (CYANOCOBALAMIN) 1000 MCG tablet Take 1,000 mcg by mouth daily.  Marland Kitchen zolpidem (AMBIEN) 10 MG tablet Take 10 mg by mouth at bedtime as needed for sleep.   . Budeson-Glycopyrrol-Formoterol (BREZTRI AEROSPHERE) 160-9-4.8 MCG/ACT AERO Inhale 2 puffs into the lungs in the morning and at bedtime.   No facility-administered encounter medications on file as of 10/09/2020.     Review of Systems  Review of Systems  Constitutional: Positive for fatigue. Negative for activity change, chills, fever and unexpected weight change.  HENT: Negative for postnasal drip, rhinorrhea, sinus pressure, sinus pain and sore throat. Congestion: clear mucous at baseline    Eyes: Negative.   Respiratory: Positive for shortness of breath and wheezing. Negative for cough.    Cardiovascular: Negative for chest pain and palpitations.  Gastrointestinal: Negative for constipation, diarrhea, nausea and vomiting.  Endocrine: Negative.   Genitourinary: Negative.  Musculoskeletal: Negative.   Skin: Negative.   Neurological: Negative for dizziness and headaches.  Psychiatric/Behavioral: Negative.  Negative for dysphoric mood. The patient is not nervous/anxious.   All other systems reviewed and are negative.    Physical Exam  BP (!) 154/84 (BP Location: Left Arm, Cuff Size: Normal)   Pulse 69   Temp 97.7 F (36.5 C)   Ht 5\' 9"  (1.753 m)   Wt 288 lb 12.8 oz (131 kg)   SpO2 91%   BMI 42.65 kg/m   Wt Readings from Last 5 Encounters:  10/09/20 288 lb 12.8 oz (131 kg)  05/24/20 268 lb (121.6 kg)  05/07/20 278 lb (126.1 kg)  01/19/20 279 lb 11.2 oz (126.9 kg)  10/20/19 276 lb (125.2 kg)    BMI Readings from Last 5 Encounters:  10/09/20 42.65 kg/m  05/24/20 38.45 kg/m  05/07/20 39.89 kg/m  01/19/20 40.13 kg/m  10/20/19 39.60 kg/m     Physical Exam Vitals and nursing note reviewed.  Constitutional:      General: He is not in acute distress.    Appearance: Normal appearance. He is obese.  HENT:     Head: Normocephalic and atraumatic.     Right Ear: Hearing, tympanic membrane, ear canal and external ear normal. There is no impacted cerumen.     Left Ear: Hearing, tympanic membrane, ear canal and external ear normal. There is no impacted cerumen.     Nose: Rhinorrhea present. No mucosal edema.     Right Turbinates: Not enlarged.     Left Turbinates: Not enlarged.     Mouth/Throat:     Mouth: Mucous membranes are dry.     Pharynx: Oropharynx is clear. No oropharyngeal exudate.     Comments: +PND Eyes:     Pupils: Pupils are equal, round, and reactive to light.  Cardiovascular:     Rate and Rhythm: Normal rate and regular rhythm.     Pulses: Normal pulses.     Heart sounds: Normal heart sounds. No murmur heard.   Pulmonary:     Effort:  Pulmonary effort is normal.     Breath sounds: No decreased breath sounds, wheezing or rales.  Musculoskeletal:     Cervical back: Normal range of motion.     Right lower leg: No edema.     Left lower leg: No edema.  Lymphadenopathy:     Cervical: No cervical adenopathy.  Skin:    General: Skin is warm and dry.     Capillary Refill: Capillary refill takes less than 2 seconds.     Findings: No erythema or rash.  Neurological:     General: No focal deficit present.     Mental Status: He is alert and oriented to person, place, and time.     Motor: No weakness.     Coordination: Coordination normal.     Gait: Gait is intact. Gait normal.  Psychiatric:        Mood and Affect: Mood normal.        Behavior: Behavior normal. Behavior is cooperative.        Thought Content: Thought content normal.        Judgment: Judgment normal.       Assessment & Plan:   Chronic diastolic heart failure (HCC) Plan: Continue follow-up with cardiology Continue to monitor weights Appears euvolemic based off lower extremities on exam today  Obstructive sleep apnea Managed on BiPAP Patient reports adherence Spouse reports that patient is napping more during the day  Patient does not use BiPAP when napping This is managed by Dr. Radford Pax  Plan: Encourage patient to continue use BiPAP Encourage patient to use BiPAP even when he is napping during the day Encourage patient to follow-up with Dr. Radford Pax immediately especially regarding poor sleep quality and BiPAP adherence  Asthma, chronic Elevated IgE in the past In donut hole Cannot afford Dulera 200 inhaler Has been off of Dulera 200 inhaler since 10/04/2020, had subtherapeutic dosing for at least a week prior to this  Plan: Start Breztri  >>> This inhaler was chosen due to lack of ICS/LABA samples Reviewed this with patient Patient will let us know how he is doing on the samples in 2 weeks Stop Dulera 200 If patient tolerates samples well  can consider sending in prescription or sending a prescription of Symbicort 160 [place] for me paperwork patient assistance forms provided for patient and spouse to fill out today Patient encouraged to use spacer   SOB (shortness of breath) Shortness of breath is likely multifactorial given patient's morbid obesity, weight gain, diastolic heart failure, reported poor compliance with BiPAP, noncompliance with inhalers, ILD seen on high-resolution CT chest  Reviewed high-resolution CT chest with patient Reviewed pulmonary function testing with patient  Plan: Start ICS/LABA/LAMA inhaler We will have close follow-up in our office Encourage patient to keep follow-up with primary care as well as cardiology Encourage patient to follow back up with cardiology for BiPAP use Easy for me paperwork provided for inhaler usage ILD questionnaire provided for patient today Close follow-up with our office with [name]   Obesity BMI 42.6  Plan: Continue work with primary care on reducing your BMI  Medication management ICS/LABA/LAMA samples provided today Easy for me paperwork provided for patient to apply for patient assistance as he is in the donut hole Encourage patient to contact our office in 2 weeks and let us know how he is doing on the samples Patient can request additional samples at that time   Healthcare maintenance Plan: Obtain COVID-19 booster  Abnormal findings on diagnostic imaging of lung Reviewed high-resolution CT chest findings  Plan: We will have patient complete ILD questionnaire We will plan on repeating high-resolution CT chest in September/2022 We will have patient follow-up closely with [name] Based off ILD questionnaire he can consider connective tissue lab work at that office visit    Return in about 6 weeks (around 11/20/2020), or if symptoms worsen or fail to improve, for Follow up with Dr. Purnell Shoemaker, ILD clinic - 74min slot.   Lauraine Rinne,  NP 10/10/2020   This appointment required 42 minutes of patient care (this includes precharting, chart review, review of results, face-to-face care, etc.).

## 2020-10-10 ENCOUNTER — Encounter: Payer: Self-pay | Admitting: Pulmonary Disease

## 2020-10-10 NOTE — Assessment & Plan Note (Signed)
Reviewed high-resolution CT chest findings  Plan: We will have patient complete ILD questionnaire We will plan on repeating high-resolution CT chest in September/2022 We will have patient follow-up closely with [name] Based off ILD questionnaire he can consider connective tissue lab work at that office visit

## 2020-10-10 NOTE — Assessment & Plan Note (Signed)
BMI 42.6  Plan: Continue work with primary care on reducing your BMI

## 2020-10-10 NOTE — Assessment & Plan Note (Signed)
Plan: Continue follow-up with cardiology Continue to monitor weights Appears euvolemic based off lower extremities on exam today

## 2020-10-10 NOTE — Addendum Note (Signed)
Addended by: Lauraine Rinne on: 10/10/2020 10:12 AM   Modules accepted: Orders

## 2020-10-10 NOTE — Assessment & Plan Note (Signed)
Shortness of breath is likely multifactorial given patient's morbid obesity, weight gain, diastolic heart failure, reported poor compliance with BiPAP, noncompliance with inhalers, ILD seen on high-resolution CT chest  Reviewed high-resolution CT chest with patient Reviewed pulmonary function testing with patient  Plan: Start ICS/LABA/LAMA inhaler We will have close follow-up in our office Encourage patient to keep follow-up with primary care as well as cardiology Encourage patient to follow back up with cardiology for BiPAP use Easy for me paperwork provided for inhaler usage ILD questionnaire provided for patient today Close follow-up with our office with [name]

## 2020-10-10 NOTE — Assessment & Plan Note (Signed)
Managed on BiPAP Patient reports adherence Spouse reports that patient is napping more during the day Patient does not use BiPAP when napping This is managed by Dr. Radford Pax  Plan: Encourage patient to continue use BiPAP Encourage patient to use BiPAP even when he is napping during the day Encourage patient to follow-up with Dr. Radford Pax immediately especially regarding poor sleep quality and BiPAP adherence

## 2020-10-10 NOTE — Assessment & Plan Note (Signed)
ICS/LABA/LAMA samples provided today Easy for me paperwork provided for patient to apply for patient assistance as he is in the donut hole Encourage patient to contact our office in 2 weeks and let us know how he is doing on the samples Patient can request additional samples at that time

## 2020-10-10 NOTE — Assessment & Plan Note (Signed)
Elevated IgE in the past In donut hole Cannot afford Dulera 200 inhaler Has been off of Dulera 200 inhaler since 10/04/2020, had subtherapeutic dosing for at least a week prior to this  Plan: Start Breztri  >>> This inhaler was chosen due to lack of ICS/LABA samples Reviewed this with patient Patient will let us know how he is doing on the samples in 2 weeks Stop Dulera 200 If patient tolerates samples well can consider sending in prescription or sending a prescription of Symbicort 160 [place] for me paperwork patient assistance forms provided for patient and spouse to fill out today Patient encouraged to use spacer

## 2020-10-10 NOTE — Assessment & Plan Note (Signed)
Plan: Obtain COVID-19 booster

## 2020-10-17 ENCOUNTER — Telehealth: Payer: Self-pay | Admitting: Internal Medicine

## 2020-10-17 MED ORDER — PREDNISONE 10 MG PO TABS: ORAL_TABLET | ORAL | 0 refills | Status: DC

## 2020-10-17 MED ORDER — DOXYCYCLINE HYCLATE 100 MG PO TABS: 100.0000 mg | ORAL_TABLET | Freq: Two times a day (BID) | ORAL | 0 refills | Status: DC

## 2020-10-17 NOTE — Telephone Encounter (Signed)
Called and spoke to pt's wife, Vickie. Informed her of the recs per Wyn Quaker, NP. Scripts sent to preferred pharmacy. Vickie verbalized understanding and denied any further questions or concerns at this time.

## 2020-10-17 NOTE — Telephone Encounter (Signed)
Seen by Warner Mccreedy NP on 10/09/20 , will send to him for review.

## 2020-10-17 NOTE — Telephone Encounter (Signed)
Called and spoke to pt's wife, Vickie. Pt c/o prod cough with dark colored mucus, hoarseness, increase in SOB x 5 days. Pt states his wife received Cipro 250mg  accidentally via mail order so pt began taking it BID x 5 days. Pt has been educated the adverse effects of taking an abx that has not been prescribed by a provider. Pt aware to not do this in the future. Pt has noticed he is able to 'break up and cough up the mucus' since starting the cirpo, all other s/s are unchanged. Pt is also taking Mucinex OTC. Pt has been vaccinated and is waiting to get his booster when he is feeling better. Pt denies CP/tightness, f/c/s, swelling. [name] is unavailable, will send to Tammy to address.   Tammy, please advise. Thanks.

## 2020-10-17 NOTE — Telephone Encounter (Signed)
10/17/2020  Patient was last seen on 10/09/2020.  Unsure why patient would start taking antibiotics not prescribed by our office.  I am glad the patient has been counseled on this.  Can offer patient:  Prednisone 10mg  tablet  >>>4 tabs for 2 days, then 3 tabs for 2 days, 2 tabs for 2 days, then 1 tab for 2 days, then stop >>>take with food  >>>take in the morning   Doxycycline >>> 1 100 mg tablet every 12 hours for 7 days >>>take with food  >>>wear sunscreen   Please place the orders.  If patient continues to have persistent discolored mucus despite these interventions or thick purulent mucus.  He will need to present to our office for sputum sampling.  Patient needs to keep upcoming appointment with [name].  Wyn Quaker, FNP

## 2020-10-22 ENCOUNTER — Other Ambulatory Visit: Payer: Self-pay | Admitting: Cardiology

## 2020-10-22 ENCOUNTER — Other Ambulatory Visit: Payer: Self-pay | Admitting: Internal Medicine

## 2020-10-22 NOTE — Telephone Encounter (Signed)
Pt is requesting refill on Montelukast Sodium 10 MG Oral Tablet next ov 11/13/20

## 2020-10-24 NOTE — Progress Notes (Signed)
Willl discuss in dec 2021 OV  IMPRESSION: 1. 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 2. Faint patchy ground-glass opacity in the peripheral perilobular right lung base, nonspecific, with the differential including nonspecific minimal postinfectious/postinflammatory scarring versus very mild interstitial lung disease such as NSIP. Consider follow-up high-resolution chest CT study in 12 months. Findings are suggestive of an alternative diagnosis (not UIP) per consensus guidelines: Diagnosis of Idiopathic Pulmonary Fibrosis: An Official ATS/ERS/JRS/ALAT Clinical Practice Guideline. [place], Iss 5, [phone], Jul 31 2017. 3. Two-vessel coronary atherosclerosis. 4. Aortic Atherosclerosis (ICD10-I70.0).   Electronically Signed   By: Ilona Sorrel M.D.   On: 08/12/2020 16:03

## 2020-11-13 ENCOUNTER — Other Ambulatory Visit: Payer: Self-pay

## 2020-11-13 ENCOUNTER — Ambulatory Visit (INDEPENDENT_AMBULATORY_CARE_PROVIDER_SITE_OTHER): Payer: Medicare Other | Admitting: Internal Medicine

## 2020-11-13 ENCOUNTER — Encounter: Payer: Self-pay | Admitting: Internal Medicine

## 2020-11-13 VITALS — BP 120/68 | HR 64 | Temp 97.6°F | Ht 69.0 in | Wt 286.8 lb

## 2020-11-13 DIAGNOSIS — I2510 Atherosclerotic heart disease of native coronary artery without angina pectoris: Secondary | ICD-10-CM | POA: Diagnosis not present

## 2020-11-13 DIAGNOSIS — G4733 Obstructive sleep apnea (adult) (pediatric): Secondary | ICD-10-CM

## 2020-11-13 DIAGNOSIS — R490 Dysphonia: Secondary | ICD-10-CM

## 2020-11-13 DIAGNOSIS — J4540 Moderate persistent asthma, uncomplicated: Secondary | ICD-10-CM | POA: Diagnosis not present

## 2020-11-13 DIAGNOSIS — Z9989 Dependence on other enabling machines and devices: Secondary | ICD-10-CM

## 2020-11-13 DIAGNOSIS — Z6841 Body Mass Index (BMI) 40.0 and over, adult: Secondary | ICD-10-CM

## 2020-11-13 DIAGNOSIS — R9389 Abnormal findings on diagnostic imaging of other specified body structures: Secondary | ICD-10-CM

## 2020-11-13 MED ORDER — BREZTRI AEROSPHERE 160-9-4.8 MCG/ACT IN AERO: 2.0000 | INHALATION_SPRAY | Freq: Two times a day (BID) | RESPIRATORY_TRACT | 0 refills | Status: DC

## 2020-11-13 NOTE — Patient Instructions (Addendum)
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

## 2020-11-13 NOTE — Progress Notes (Signed)
OV 01/29/2016  Chief Complaint  Patient presents with  . Follow-up    Pt here after acute visit with SG on 2.10.17. Pt states his breathing has imrpoved since last OV. Pt states he has occassional wheezing and mild non prod cough. Pt denies CP/tightness.     Keith Bernard returns for chronic obstructive lung disease asthma follow-up. He is now on Dulera plus Singulair. He is compliant with Dulera. He says with improved compliance his symptoms are a lot better. His wife is here with him. Most recently he had a flareup and was treated with prednisone. I review the chart when he was seen by [name] gross my nurse practitioner. Currently he feels asthma is well controlled. However he still uses albuterol multiple times a day for rescue but denies any nocturnal symptoms or daytime symptoms. He is compliant with this CPAP for sleep apnea.  Review of the chart shows this is in December 2012 has elevated eos 400 cells per cubic millimeter. I notice that and IgE was never checked. In any event subjectively is well controlled currently. Also noted on alpha-1 has not been checked.  Asthma control questionnaire shows that at night he hardly ever wakes up because of asthma. When he wakes up he has very mild symptoms. In the daytime he is moderately limited because of asthma although this could be because of obesity. He also has moderate shortness of breath because of asthma although this could be because of obesity. He is wheezing a lot of the time and uses albuterol 3-4 puffs on most days. Nevertheless average score of 2.2 show significant improvement after starting [place] 05/04/2016  Chief Complaint  Patient presents with  . Follow-up    Pt c/o occasional wheeze since not being on Breo for about a week. Pt has been using albuterol HFA more. Pt denies cough/SOB/CP/tightness.    Follow-up asthma. Last seen March 2017. This is a routine follow-up. I put him on Breo when she really liked. However the  cost dollars out of pocket because he is in the donut hole. So he ran out of it over a week ago and is having increased symptoms. Asthma control questionnaire is 2.0 and still within his range from prior visits. Exhaled nitric oxide is just over 25 suggests some amount of active airway inflammation. His wife is here with him and she says that she got approved for the [facility] patient assistance program but apparently between our office in the mailroom the paperwork has been loss. She is willing to redo the paperwork and she is wondering if I could sign my part of it this week.  In terms of symptoms specifically tells me that he does wake up a few times at night. When he wakes up he has very mild symptoms. Slightly limited in his activities because of asthma. He has moderate amount of shortness of breath with exertion. He does wheeze a little of the time and is using albuterol for rescue at least one or 2 times daily. 5 point score is 2/.0 showing activity - is worse this past week or so is within normal   OV 01/04/2017   Chief Complaint  Patient presents with  . Follow-up    Pt states last week he had an 'asthma attack' and went to ED in [place] and was given steroid and abx per pt's wife. Pt states he feels he is improving. Pt states he does not feel back to baseline. Pt denies CP/tightness  and f/c/s.     Follow-up moderate persistent asthma  Last seen June 2017. He presents with his wife as always. He is on Dulera through the assistance program. Some 3 weeks ago his sister died from terminal cancer. Take a flight to [place]. His been dealing with those emotions. Because of the travel, emotions and weather change some 10 days ago he had an asthma exacerbation but never below few days with increased cough and wheezing. He went to a local emergency department and got one shot of steroids and was given 7 days of cephalexin which is completed. Despite this is not fully better. He still has some cough  and wheeze and yellow sputum. There is no fever.   OV 07/07/2017  Chief Complaint  Patient presents with  . Follow-up    Pt using Dulera, still having trouble getting this covered by insurance and getting Financial Assistance/Patient Assistance. Pt states that his breathing is doing better after senc round of Abx/Prednisone.      Follow-up moderate persistent asthma   Last seen February 2018. He presents with his wife. Overall he is doing stable except with the summer heat his symptoms are slightly more than usual as seen in the asthma control questionnaire. He says that he does not wake up at night because of asthma. When he wakes up he has mild symptoms. He has limitation in his activities because of asthma but this could also be because of obesity and DJD and spine issues. He does get dyspneic for moderate amount of the time and is wheezing a lot of the time but using albuterol for rescue 1-2 puffs per day. He's having significant difficulty getting [employer] to pay for his assistance program and is in need of samples today. He continues on Singulair.   OV 12/09/2017  Chief Complaint  Patient presents with  . Follow-up    shortness of breath   Follow-up moderate persistent asthma  Routine 73-month follow-up.  He presents with his wife.  Overall he is doing fine.  He has significant symptoms on his asthma control questionnaire.  In fact his symptoms are slowly deteriorated.  Average score is 2.8.  But this resolved mostly shortness of breath.  He feels is due to obesity.  His nitric oxide is 12 showing good asthma control on Dulera and Singulair.  He plans to lose weight.  Recently had a fall and hurt the shoulder.  Review of the images show is not had a chest x-ray since 2010.  He is up-to-date with his flu shot.  In terms of his asthma control questionnaire he hardly ever wakes up at night.  When he wakes up he has moderate symptoms.  He is limited with his activities moderately and does  express a moderate amount of shortness of breath but he does wheeze occasionally.  He is using albuterol for rescue multiple times a day.  =  OV 08/05/2018  Subjective:  Patient ID: Keith Bernard, male , DOB: [birth year] , age 24 y.o. , MRN: [id number] , ADDRESS: [place] [id number]   08/05/2018 -   Chief Complaint  Patient presents with  . Follow-up    Pt has been sick and was put on prednisone and abx which he began Monday, 9/2.  Pt has c/o cough with green mucus, increased SOB. Denies any CP or chest tightness.     HPI Keith Bernard 74 y.o. -moderate persistent asthma patient here for follow-up. He is here with  his wife. They tell me that has been many months since he has taken his inhale corticosteroid Dulera because the patient assistance program has been slow to respond.He is relying on sampples. Apparently got some Symbicort from usat some point in the past since he last saw me. However he has run out of that as well. He is not sure how long it is since he ran out of that. So he is having a flareup. He is just dependent on prednisone for the last 5 days with antibiotic symptoms by our nurse practitioner.He does not feel fully back to baseline. His asthma control question is 3.2 showing significant nocturnal symptoms or albuterol rescue use OF breath and wheezing. In fact on exam he had some wheezing is exhaled nitric oxide was 28 while on prednisone       OV 11/11/2018  Subjective:  Patient ID: Keith Bernard, male , DOB: 05/09/1946 , age 52 y.o. , MRN: [id number] , ADDRESS: [place] [address]   11/11/2018 -   Chief Complaint  Patient presents with  . Follow-up    f/u asthma. Pt states he has had a recent flare up, wheezing has increased.   Moderate persistent asthma for follow-up.  He is on [place] and Singulair  HPI Keith Bernard 74 y.o. -for the last 3 days he is reporting increased cough with congestion and some  mild increase in wheezing with green sputum.  Asthma control questionnaire shows that he is waking up a few times at night.  When he wakes up he is quite severe symptoms he is very limited in his activities because of asthma experiencing a very great deal of shortness of breath and wheezing all the time using albuterol for rescue 5 times daily.  However nitric oxide test is normal.  Clinically is not wheezing actively.  Recently had edema and my nurse practitioner started him on Lasix.  He tells me there are a bunch of cats at home and also significant mold exposure.  The wife is interested in allergy testing but they do not have the financial resources to get rid of the cats or get rid of the mold to sell the house.  They are frustrated by all this.  He says today he will be content taking antibiotic and if it gets worse doing prednisone.  Last chest x-ray October 2019.  He is never had CT chest.       OV 09/19/2019  Subjective:  Patient ID: Keith Bernard, male , DOB: 02-04-1946 , age 63 y.o. , MRN: [id number] , ADDRESS: [place] [id number]   09/19/2019 -   follow-up obstructive lung disease.  To person identifier used to identify the patient on this telephone visit.  Risks, benefits and limitations explained.  Moderate persistent asthma for follow-up.  He is on Dulera and Singulair History of exposure to the mold   HPI Keith Bernard 74 y.o. -presents for this telephone visit.  He says he is doing well.  Socially distancing and trying to avoid COVID-19.  The main purpose of the call is that he wants a refill on the Singulair and was told to make a telephone visit.  He says his symptoms are really well controlled.  Last year he got rid of the mold in the house.  He save money and got this done.  He still has some kittens with is not bothering him.  He says her asthma is really  much better after getting rid of the mold.  He continues on Dulera and Singulair.  Is  up-to-date with his flu shot.  He has some baseline shortness of breath and neuropathy.  He is compliant with his CPAP for sleep apnea.  There are no other new problems     ROS - per HPI     OV 05/07/2020  Subjective:  Patient ID: Keith Bernard, male , DOB: [birth year] , age 32 y.o. , MRN: [id number] , ADDRESS: [place] [id number]   05/07/2020 -   Chief Complaint  Patient presents with  . Follow-up    Pt states he has been doing okay since last visit. Pt states that he does become SOB due to being overweight.   Moderate persistent asthma for follow-up.  He is on Dulera and Singulair History of exposure to the mold - cleaned house spring 2021   HPI Keith Bernard 74 y.o. -returns for follow-up.  He presents with his wife.  Wife is giving most of the history and is having to redirect the husband.  As best as I can gather it appears the asthma is well controlled.  However the ACT score is 14 suggesting poor control.  In talking to him I find out that he is using his albuterol multiple times a day.  He thinks this is a habit but also is using it because of sore throat.  He also states that he is also using it for shortness of breath.  He is not having any nocturnal symptoms of wheezing.  He just uses albuterol randomly at the daytime.  However wife and he overall feels asthma is well controlled with Dulera and Singulair.  They believe the Ruthe Mannan is a life saver.  Asking for charity support of filling a form for [facility].  In terms of his mold exposure he has gotten rid of the mold.  He feels masking and social distancing has helped his respiratory quality of life.   OV 11/13/2020   Subjective:  Patient ID: Keith Bernard, male , DOB: 1946/04/19, age 56 y.o. years. , MRN: [id number],  ADDRESS: [place] [id number] PCP  [name], MD Providers : Treatment Team:  Attending Provider: Brand Males, MD Patient Care  Team: [name], MD as PCP - General (Family Medicine) [name], MD as Consulting Physician (Cardiology)    Chief Complaint  Patient presents with  . Follow-up    Patient is hoarse all the time and they are concerned about it. Patient states he would like to stay on Breztri. Both feet seem to be swollen, patient states that he is tired all the time and sleeps a lot during the day.     Moderate persistent asthma for follow-up.  He is on Dulera and Singulair> changed toBREZTRI summer/fall 2021 History of exposure to the mold - cleaned house spring 2021 Elevated IgE level June 2021 with eosinophils 200 cells per cubic millimeter June 2021  OSA on CPAP Morbid obesity uses a cane Early interstitial lung abnormalities on September 2021 high-resolution CT chest Elevation of left hemidiaphragm noted on September 2021 CT chest   HPI Keith Bernard 74 y.o. -returns for follow-up with his wife.  Overall he feels stable.  His biggest issue is that over the last few to several months he has had worsening of his chronic intermittent hoarseness of voice.  It is now persistent.  He states one of his family  relatives had vocal cord nodules.  Therefore he is concerned.  He feels his asthma is well controlled with his current inhaler regimen.  He is now on triple inhaler therapy.  He says it took a while for him to get used to it but he says he likes it now.  In terms of his sleep apnea is continuing on CPAP.  After last visit I checked a blood IgE and eosinophils.  His IgE slightly elevated.  His high-resolution CT chest shows evidence of early interstitial lung abnormalities but no clear-cut evidence of ILD/pulmonary fibrosis in my opinion.  Despite feeling his asthma is under control he complains of shortness of breath.  This is probably related to his obesity.  He gets dyspneic when he bends down or when he walks.  He wants to see a nutritionist or undergo some kind of a program where he could  lose weight.  Of note: They wanted me to help filling out the form for charity application for his inhaler therapy from [place].  The program is called [place] and me  Asthma Control Test ACT Total Score  05/07/2020 14    Lab Results  Component Value Date   NITRICOXIDE 11 11/11/2018       Asthma Control Panel -December 2020 -  eos 400 cells per cubic millimeter \-> June 2021 200 cells - IgE 235 elevated June 2021 - obstn with low dlco nov 2016 on PFT - normal IgG, IgM and IgA  -feb 2021   - Alpha 1 is MM 08/23/2015  01/29/2016  05/04/2016  07/07/2017  12/09/2017  08/05/2018  11/11/2018  05/07/2020   Current Med Regimen symbicort  Dulera  Off breo for several days.  On dulera 100. Struggling to afford and singulair dulera and singulair dulera and singulair dulra and singulair   ACT - a [employer] test - 4 week. Total score. Max is 25, Lower score is worse.  <19 = poor control        14  ACQ 5 point- 1 week. wtd avg score. <1.0 is good control 0.75-1.25 is grey zone. >1.25 poor control. Delta 0.5 is clinically meaningful 3.2 2.2 2.0 2.6 2.8 3.2 4.2   FeNO ppB 34 x 26  13 28  on prednisone 11   FeV1  x  x       Planned intervention  for visit Start dulera + singulair Cont dulera         Results for ENOC, GETTER (MRN [id number]) as of 11/13/2020 11:28  Ref. Range 05/07/2020 12:13  IgE (Immunoglobulin E), Serum Latest Ref Range: <OR=114 kU/L 235 (H)  Results for ESTON, HESLIN (MRN [id number]) as of 11/13/2020 11:28  Ref. Range 05/07/2020 12:13  Eosinophils Absolute Latest Ref Range: 0.0 - 0.7 K/uL 0.2    PFT Results Latest Ref Rng & Units 06/19/2020 10/17/2015  FVC-Pre L 2.44 2.22  FVC-Predicted Pre % 62 54  FVC-Post L 2.58 2.34  FVC-Predicted Post % 65 57  Pre FEV1/FVC % % 75 75  Post FEV1/FCV % % 79 80  FEV1-Pre L 1.85 1.66  FEV1-Predicted Pre % 64 55  FEV1-Post L 2.04 1.88  DLCO uncorrected ml/min/mmHg 18.83 19.62  DLCO UNC% % 79 66  DLCO corrected ml/min/mmHg 20.59 -  DLCO COR  %Predicted % 86 -  DLVA Predicted % 123 121  TLC L 4.78 4.62  TLC % Predicted % 71 69  RV % Predicted % 101 98    CT chest HRCT Sept 2021  IMPRESSION:  1. 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 2. Faint patchy ground-glass opacity in the peripheral perilobular right lung base, nonspecific, with the differential including nonspecific minimal postinfectious/postinflammatory scarring versus very mild interstitial lung disease such as NSIP. Consider follow-up high-resolution chest CT study in 12 months. Findings are suggestive of an alternative diagnosis (not UIP) per consensus guidelines: Diagnosis of Idiopathic Pulmonary Fibrosis: An Official ATS/ERS/JRS/ALAT Clinical Practice Guideline. [place], Iss 5, [phone], Jul 31 2017. 3. Two-vessel coronary atherosclerosis. 4. Aortic Atherosclerosis (ICD10-I70.0).   Electronically Signed   By: Ilona Sorrel M.D.   On: 08/12/2020 16:03    has a past medical history of Asthma, Chronic diastolic CHF (congestive heart failure) ([place]), Chronic kidney disease (CKD), stage III (moderate) ([place]), Chronic pain, Coronary artery disease, Depression (05/04/2016), DJD (degenerative joint disease), Dyslipidemia, ETOH abuse, Foot ulcer ([place]) (07/07/2017), GERD (gastroesophageal reflux disease), Hypertension, Neuropathy, Obesity, OSA (obstructive sleep apnea), and RBBB.   reports that he has never smoked. He has never used smokeless tobacco.  Past Surgical History:  Procedure Laterality Date  . CORONARY STENT PLACEMENT    . REPLACEMENT TOTAL KNEE     left knee    No Known Allergies  Immunization History  Administered Date(s) Administered  . DTaP 07/22/2001  . Fluad Quad(high Dose 65+) 11/04/2016, 08/26/2020  . Influenza Split 09/30/2014  . Influenza, High Dose Seasonal PF 11/04/2016, 08/17/2017, 11/03/2018, 09/08/2019  . Influenza,  Seasonal, Injecte, Preservative Fre 08/30/2017  . Influenza,inj,Quad PF,6+ Mos 10/23/2015  . Influenza-Unspecified 08/30/2017  . Moderna Sars-Covid-2 Vaccination 02/08/2020, 03/09/2020  . Pneumococcal Conjugate-13 01/02/2015, 10/01/2015  . Pneumococcal Polysaccharide-23 04/11/2012  . Pneumococcal-Unspecified 04/11/2012  . Tdap 12/22/2016  . Zoster 01/02/2015  . Zoster Recombinat (Shingrix) 07/21/2017, 01/20/2018    Family History  Problem Relation Age of Onset  . COPD Mother   . Pancreatic cancer Father        pancreatic   . Other Cousin        Amyloidosis     Current Outpatient Medications:  .  albuterol (PROAIR HFA) 108 (90 Base) MCG/ACT inhaler, Inhale 2 puffs into the lungs every 6 (six) hours as needed for wheezing or shortness of breath., Disp: 1 Inhaler, Rfl: 5 .  albuterol (PROVENTIL) (2.5 MG/3ML) 0.083% nebulizer solution, Take 3 mLs (2.5 mg total) by nebulization every 6 (six) hours as needed for wheezing or shortness of breath., Disp: 75 mL, Rfl: 5 .  aspirin 81 MG tablet, Take 81 mg by mouth daily., Disp: , Rfl:  .  atorvastatin (LIPITOR) 20 MG tablet, TAKE 1 TABLET BY MOUTH  DAILY, Disp: 90 tablet, Rfl: 0 .  Budeson-Glycopyrrol-Formoterol (BREZTRI AEROSPHERE) 160-9-4.8 MCG/ACT AERO, Inhale 2 puffs into the lungs in the morning and at bedtime., Disp: 5.9 g, Rfl: 0 .  CALCIUM PO, Take by mouth 2 (two) times daily. , Disp: , Rfl:  .  Cholecalciferol (VITAMIN D3) 1000 units CAPS, Take 1,000 Units by mouth., Disp: , Rfl:  .  CO ENZYME Q-10 PO, Take by mouth daily. , Disp: , Rfl:  .  escitalopram (LEXAPRO) 20 MG tablet, Take 20 mg by mouth daily., Disp: , Rfl:  .  esomeprazole (NEXIUM) 40 MG capsule, Take 20 mg by mouth daily before breakfast., Disp: , Rfl:  .  fenofibrate 160 MG tablet, TAKE 1 TABLET BY MOUTH  DAILY WITH FOOD, Disp: 90 tablet, Rfl: 1 .  furosemide (LASIX) 20 MG tablet, Take  1 tablet (20 mg total) by mouth every other day., Disp: 45 tablet, Rfl: 3 .   gabapentin (NEURONTIN) 300 MG capsule, Take 300 mg by mouth 3 (three) times daily., Disp: , Rfl:  .  LORazepam (ATIVAN) 1 MG tablet, Take 1 mg by mouth every 8 (eight) hours., Disp: , Rfl:  .  Magnesium 250 MG TABS, Take 1 tablet by mouth daily., Disp: , Rfl:  .  metoprolol tartrate (LOPRESSOR) 25 MG tablet, TAKE 1 TABLET BY MOUTH  TWICE DAILY, Disp: 180 tablet, Rfl: 1 .  Misc Natural Products (COLON CLEANSE PO), Take by mouth. , Disp: , Rfl:  .  Misc. Devices MISC, [place] brace. He has a double upright brace that is no longer enough support for the arthritis and instability of his midfoot and ankle on his right.  It is in lieu of surgical correction., Disp: , Rfl:  .  montelukast (SINGULAIR) 10 MG tablet, TAKE 1 TABLET BY MOUTH AT  BEDTIME, Disp: 90 tablet, Rfl: 3 .  nitroGLYCERIN (NITROSTAT) 0.4 MG SL tablet, Place 1 tablet (0.4 mg total) under the tongue every 5 (five) minutes as needed., Disp: 25 tablet, Rfl: 3 .  Oxycodone HCl 10 MG TABS, Take 10 mg by mouth every 6 (six) hours as needed., Disp: , Rfl:  .  Spacer/Aero-Holding Chambers (AEROCHAMBER MV) inhaler, Use as instructed, Disp: 1 each, Rfl: 0 .  tamsulosin (FLOMAX) 0.4 MG CAPS capsule, Take 0.4 mg by mouth daily. , Disp: , Rfl:  .  triamcinolone (NASACORT) 55 MCG/ACT AERO nasal inhaler, 2 puffs in each nostril, Disp: , Rfl:  .  vitamin B-12 (CYANOCOBALAMIN) 1000 MCG tablet, Take 1,000 mcg by mouth daily., Disp: , Rfl:  .  zolpidem (AMBIEN) 10 MG tablet, Take 10 mg by mouth at bedtime as needed for sleep. , Disp: , Rfl:       Objective:   Vitals:   11/13/20 1109  BP: 120/68  Pulse: 64  Temp: 97.6 F (36.4 C)  TempSrc: Temporal  SpO2: 94%  Weight: 286 lb 12.8 oz (130.1 kg)  Height: 5\' 9"  (1.753 m)    Estimated body mass index is 42.35 kg/m as calculated from the following:   Height as of this encounter: 5\' 9"  (1.753 m).   Weight as of this encounter: 286 lb 12.8 oz (130.1 kg).  @WEIGHTCHANGE @  [employer]    11/13/20 1109  Weight: 286 lb 12.8 oz (130.1 kg)     Physical Exam   General: No distress. Obese HOARSE VOICE Neuro: Alert and Oriented x 3. GCS 15. Speech normal Psych: Pleasant Resp:  Barrel Chest - no.  Wheeze - no, Crackles - no, No overt respiratory distress CVS: Normal heart sounds. Murmurs - no Ext: Stigmata of Connective Tissue Disease - no. HAS CANE.  HEENT: Normal upper airway. PEERL +. No post nasal drip        Assessment:       ICD-10-CM   1. Moderate persistent asthma without complication  J88.32   2. OSA on CPAP  G47.33    Z99.89   3. Class 3 severe obesity due to excess calories with serious comorbidity and body mass index (BMI) of 40.0 to 44.9 in adult (HCC)  E66.01    Z68.41   4. Abnormal CT of the chest  R93.89   5. Hoarseness of voice  R49.0        Plan:     Patient Instructions  Moderate persistent asthma without complication with elevated IgE  -  clinically well controlled  Plan  - continue breztri  - if asthma spins out of control, can consider xolair   OSA on CPAP  - cotninue cpap through sleep doc  Class 3 severe obesity due to excess calories with serious comorbidity and body mass index (BMI) of 40.0 to 44.9 in adult [facility])  - look ad [facility] or a low carb plan  Abnormal CT of the chest - early ILA otherwise call interstitial lung abnormalities  Plan -Do repeat high-resolution CT chest in 1 year supine and prone  Hoarseness of voice -new  Plan -Refer to ENT  Follow-up -6 months or sooner if needed [ACT and exhaled  nitric oxide testing at follow-up     ( Level 05 visit: Estb 40-54 min   in  visit type: on-site physical face to visit  in total care time and counseling or/and coordination of care by this undersigned MD - Dr Brand Males. This includes one or more of the following on this same day 11/13/2020: pre-charting, chart review, note writing, documentation discussion of test results, diagnostic or  treatment recommendations, prognosis, risks and benefits of management options, instructions, education, compliance or risk-factor reduction. It excludes time spent by the [facility] or office staff in the care of the patient. Actual time 56 min)   SIGNATURE    Dr. Brand Males, M.D., F.C.C.P,  Pulmonary and Critical Care Medicine Staff Physician, [place] Director - Interstitial Lung Disease  Program  Pulmonary [place] [facility] [place], [place], [address]  Pager: [phone], If no answer or between  15:00h - 7:00h: call [phone] Telephone: [phone]  11:42 AM 11/13/2020

## 2020-11-21 ENCOUNTER — Telehealth: Payer: Self-pay | Admitting: Cardiology

## 2020-11-21 NOTE — Telephone Encounter (Signed)
   Pt c/o medication issue:  1. Name of Medication: furosemide (LASIX) 20 MG tablet  2. How are you currently taking this medication (dosage and times per day)? 20 mg every other day   3. Are you having a reaction (difficulty breathing--STAT)?   4. What is your medication issue? Patient has noticed some significant swelling over the past two months. He is not sure if he should start taking it every day   Pt c/o swelling: STAT is pt has developed SOB within 24 hours  1) How much weight have you gained and in what time span? 20 pounds in three months  2) If swelling, where is the swelling located? Feet   3) Are you currently taking a fluid pill? Yes   4) Are you currently SOB? A little, but pt says he is also extremely  overweight   5) Do you have a log of your daily weights (if so, list)?  no  6) Have you gained 3 pounds in a day or 5 pounds in a week? no  7) Have you traveled recently? no

## 2020-11-21 NOTE — Telephone Encounter (Signed)
Not able to leave voicemail mail. Phone line just kept ringing.

## 2020-11-28 NOTE — Telephone Encounter (Signed)
Spoke with the patient who states that he received his new BiPAP supplies and has been doing well with it.  He states that he has been elevating his feet and his swelling has gone down.  Advised patient to continue with feet elevation and also to avoid salt in his diet. He will call us if swelling returns or if he gains 3 pounds overnight or 5 pounds in a week.

## 2020-12-05 ENCOUNTER — Other Ambulatory Visit: Payer: Self-pay | Admitting: Cardiology

## 2020-12-14 ENCOUNTER — Other Ambulatory Visit: Payer: Self-pay | Admitting: Cardiology

## 2020-12-20 ENCOUNTER — Telehealth: Payer: Self-pay | Admitting: Internal Medicine

## 2020-12-20 NOTE — Telephone Encounter (Signed)
Called and spoke with pt and he is going to print off these forms and get them completed and drop them back off to our office.  Nothing further is needed.

## 2021-01-09 ENCOUNTER — Other Ambulatory Visit: Payer: Self-pay | Admitting: Hematology

## 2021-01-09 DIAGNOSIS — D472 Monoclonal gammopathy: Secondary | ICD-10-CM

## 2021-01-10 ENCOUNTER — Other Ambulatory Visit: Payer: Self-pay

## 2021-01-10 ENCOUNTER — Inpatient Hospital Stay: Payer: Medicare Other | Attending: Hematology

## 2021-01-10 DIAGNOSIS — D472 Monoclonal gammopathy: Secondary | ICD-10-CM | POA: Insufficient documentation

## 2021-01-10 LAB — CBC WITH DIFFERENTIAL/PLATELET
Abs Immature Granulocytes: 0.04 10*3/uL (ref 0.00–0.07)
Basophils Absolute: 0.1 10*3/uL (ref 0.0–0.1)
Basophils Relative: 1 %
Eosinophils Absolute: 0.4 10*3/uL (ref 0.0–0.5)
Eosinophils Relative: 6 %
HCT: 40.5 % (ref 39.0–52.0)
Hemoglobin: 12.8 g/dL — ABNORMAL LOW (ref 13.0–17.0)
Immature Granulocytes: 1 %
Lymphocytes Relative: 28 %
Lymphs Abs: 1.9 10*3/uL (ref 0.7–4.0)
MCH: 30.9 pg (ref 26.0–34.0)
MCHC: 31.6 g/dL (ref 30.0–36.0)
MCV: 97.8 fL (ref 80.0–100.0)
Monocytes Absolute: 0.6 10*3/uL (ref 0.1–1.0)
Monocytes Relative: 9 %
Neutro Abs: 3.7 10*3/uL (ref 1.7–7.7)
Neutrophils Relative %: 55 %
Platelets: 217 10*3/uL (ref 150–400)
RBC: 4.14 MIL/uL — ABNORMAL LOW (ref 4.22–5.81)
RDW: 13.3 % (ref 11.5–15.5)
WBC: 6.7 10*3/uL (ref 4.0–10.5)
nRBC: 0 % (ref 0.0–0.2)

## 2021-01-10 LAB — CMP (CANCER CENTER ONLY)
ALT: 9 U/L (ref 0–44)
AST: 15 U/L (ref 15–41)
Albumin: 3.7 g/dL (ref 3.5–5.0)
Alkaline Phosphatase: 58 U/L (ref 38–126)
Anion gap: 7 (ref 5–15)
BUN: 11 mg/dL (ref 8–23)
CO2: 28 mmol/L (ref 22–32)
Calcium: 9 mg/dL (ref 8.9–10.3)
Chloride: 102 mmol/L (ref 98–111)
Creatinine: 0.91 mg/dL (ref 0.61–1.24)
GFR, Estimated: >60 mL/min (ref >60)
Glucose, Bld: 101 mg/dL — ABNORMAL HIGH (ref 70–99)
Potassium: 4.3 mmol/L (ref 3.5–5.1)
Sodium: 137 mmol/L (ref 135–145)
Total Bilirubin: 1 mg/dL (ref 0.3–1.2)
Total Protein: 7 g/dL (ref 6.5–8.1)

## 2021-01-13 LAB — MULTIPLE MYELOMA PANEL, SERUM
Albumin SerPl Elph-Mcnc: 3.5 g/dL (ref 2.9–4.4)
Albumin/Glob SerPl: 1.2 (ref 0.7–1.7)
Alpha 1: 0.3 g/dL (ref 0.0–0.4)
Alpha2 Glob SerPl Elph-Mcnc: 0.7 g/dL (ref 0.4–1.0)
B-Globulin SerPl Elph-Mcnc: 1 g/dL (ref 0.7–1.3)
Gamma Glob SerPl Elph-Mcnc: 1 g/dL (ref 0.4–1.8)
Globulin, Total: 3 g/dL (ref 2.2–3.9)
IgA: 266 mg/dL (ref 61–437)
IgG (Immunoglobin G), Serum: 1060 mg/dL (ref 603–1613)
IgM (Immunoglobulin M), Srm: 30 mg/dL (ref 15–143)
M Protein SerPl Elph-Mcnc: 0.3 g/dL — ABNORMAL HIGH
Total Protein ELP: 6.5 g/dL (ref 6.0–8.5)

## 2021-01-16 NOTE — Progress Notes (Signed)
HEMATOLOGY/ONCOLOGY CLINIC NOTE  Date of Service: 01/17/2021  Patient Care Team: [name], MD as PCP - General (Family Medicine) [name], MD as Consulting Physician (Cardiology)  CHIEF COMPLAINTS/PURPOSE OF CONSULTATION:  MGUS  HISTORY OF PRESENTING ILLNESS:   Keith Bernard is a wonderful 75 y.o. male who has been previously followed by my colleague Dr. 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 He is accompanied today by his wife. The pt reports that he is doing well overall.   The pt reports that he has had less energy over the last couple years but denies any recent changes in his energy levels. The pt's wife notes that his PCP originally referred him to a nephrologist for observed blood and protein in the urine. This work up ended up in a referral to my colleague Dr. Lebron Conners.   The pt notes that he is often tired, doesn't sleep very well, as he ends up watching TV into the morning and isn't always compliant with his BIPAP. He does not endorse any other concerns that have developed over the last couple months.   The pt notes that he uses Androgel.   Most recent lab results (07/05/18) of CBC w/diff and CMP is as follows: all values are WNL except for RBC at 3.91, HGB at 12.7, HCT at 37.8, and Glucose at 102. MMP 07/05/18 revealed all values WNL except for M Protein at 0.4. Kappa/Lambda 07/05/18 revealed all values WNL except for Kappa at 23.9 LDH 07/05/18 was WNL at 154  On review of systems, pt reports feeling tired, and denies pain along the spine, new bone pains, fevers, chills, night sweats and any other symptoms.   Interval History:   Keith Bernard returns today for management and evaluation of his MGUS. The patient's last visit with [place] was on 01/19/2020. The pt reports that he is doing well overall. We are joined today by his wife.   The pt reports that he has been dealing with a hoarse voice that has changed recently. His pulmonary doctor said this is  most likely from his medication and has put the pt on Nexium. The pt has recently started engaging in more physical activity and starting to lose weight. The pt uses a bipap machine every night, but does not need any oxygen. The pt is monitoring these symptoms with Dr. Constance Holster.  Lab results 01/10/2021 of CBC w/diff and CMP is as follows: all values are WNL except for RBC of 4.14, Hgb of 12.8, Glucose of 101. 01/10/2021 MMP all WNL except m protein of 0.3.   On review of systems, pt reports hoarse voice and denies back pain, abdominal pain, acute leg swelling and any other symptoms.  MEDICAL HISTORY:  Past Medical History:  Diagnosis Date  . Asthma   . Chronic diastolic CHF (congestive heart failure) ([place])   . Chronic kidney disease (CKD), stage III (moderate) ([facility])   . Chronic pain   . 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 Depression 05/04/2016  . DJD (degenerative joint disease)   . Dyslipidemia   . ETOH abuse   . Foot ulcer ([place]) 07/07/2017  . GERD (gastroesophageal reflux disease)   . Hypertension   . Neuropathy   . Obesity   . OSA (obstructive sleep apnea)  Moderate OSA w AHI 17.7/hr now on VPAP at EEP at 7cm H2O, min PS 3cm H2O,max PS 15cm H2O  . RBBB     SURGICAL HISTORY: Past Surgical History:  Procedure Laterality Date  . CORONARY STENT PLACEMENT    . REPLACEMENT TOTAL KNEE     left knee    SOCIAL HISTORY: Social History   Socioeconomic History  . Marital status: Married    Spouse name: Not on file  . Number of children: Not on file  . Years of education: Not on file  . Highest education level: Not on file  Occupational History  . Occupation: Risk manager  Tobacco Use  . Smoking status: Never Smoker  . Smokeless tobacco: Never Used  Vaping Use  . Vaping Use: Never used  Substance and Sexual  Activity  . Alcohol use: Yes    Alcohol/week: 0.0 standard drinks    Comment: Moderate, beer  . Drug use: No  . Sexual activity: Not on file  Other Topics Concern  . Not on file  Social History Narrative  . Not on file   Social Determinants of Health   Financial Resource Strain: Not on file  Food Insecurity: Not on file  Transportation Needs: Not on file  Physical Activity: Not on file  Stress: Not on file  Social Connections: Not on file  Intimate Partner Violence: Not on file    FAMILY HISTORY: Family History  Problem Relation Age of Onset  . COPD Mother   . Pancreatic cancer Father        pancreatic   . Other Cousin        Amyloidosis    ALLERGIES:  has No Known Allergies.  MEDICATIONS:  Current Outpatient Medications  Medication Sig Dispense Refill  . albuterol (PROAIR HFA) 108 (90 Base) MCG/ACT inhaler Inhale 2 puffs into the lungs every 6 (six) hours as needed for wheezing or shortness of breath. 1 Inhaler 5  . albuterol (PROVENTIL) (2.5 MG/3ML) 0.083% nebulizer solution Take 3 mLs (2.5 mg total) by nebulization every 6 (six) hours as needed for wheezing or shortness of breath. 75 mL 5  . aspirin 81 MG tablet Take 81 mg by mouth daily.    Marland Kitchen atorvastatin (LIPITOR) 20 MG tablet TAKE 1 TABLET BY MOUTH  DAILY 90 tablet 1  . Budeson-Glycopyrrol-Formoterol (BREZTRI AEROSPHERE) 160-9-4.8 MCG/ACT AERO Inhale 2 puffs into the lungs in the morning and at bedtime. 5.9 g 0  . Budeson-Glycopyrrol-Formoterol (BREZTRI AEROSPHERE) 160-9-4.8 MCG/ACT AERO Inhale 2 puffs into the lungs 2 (two) times daily. 10.7 g 0  . CALCIUM PO Take by mouth 2 (two) times daily.     . Cholecalciferol (VITAMIN D3) 1000 units CAPS Take 1,000 Units by mouth.    . CO ENZYME Q-10 PO Take by mouth daily.     Marland Kitchen escitalopram (LEXAPRO) 20 MG tablet Take 20 mg by mouth daily.    Marland Kitchen esomeprazole (NEXIUM) 40 MG capsule Take 20 mg by mouth daily before breakfast.    . fenofibrate 160 MG tablet TAKE 1 TABLET BY  MOUTH  DAILY WITH FOOD 90 tablet 1  . furosemide (LASIX) 20 MG tablet Take 1 tablet (20 mg total) by mouth every other day. 45 tablet 3  . gabapentin (NEURONTIN) 300 MG capsule Take 300 mg by mouth 3 (three) times daily.    Marland Kitchen LORazepam (ATIVAN) 1 MG tablet Take 1 mg by mouth every 8 (eight) hours.    . Magnesium 250 MG TABS Take 1 tablet by  mouth daily.    . metoprolol tartrate (LOPRESSOR) 25 MG tablet TAKE 1 TABLET BY MOUTH  TWICE DAILY 180 tablet 1  . Misc Natural Products (COLON CLEANSE PO) Take by mouth.     . Misc. Devices [place] brace. He has a double upright brace that is no longer enough support for the arthritis and instability of his midfoot and ankle on his right.  It is in lieu of surgical correction.    . montelukast (SINGULAIR) 10 MG tablet TAKE 1 TABLET BY MOUTH AT  BEDTIME 90 tablet 3  . nitroGLYCERIN (NITROSTAT) 0.4 MG SL tablet Place 1 tablet (0.4 mg total) under the tongue every 5 (five) minutes as needed. 25 tablet 3  . Oxycodone HCl 10 MG TABS Take 10 mg by mouth every 6 (six) hours as needed.    Marland Kitchen Spacer/Aero-Holding Chambers (AEROCHAMBER MV) inhaler Use as instructed 1 each 0  . tamsulosin (FLOMAX) 0.4 MG CAPS capsule Take 0.4 mg by mouth daily.     Marland Kitchen triamcinolone (NASACORT) 55 MCG/ACT AERO nasal inhaler 2 puffs in each nostril    . vitamin B-12 (CYANOCOBALAMIN) 1000 MCG tablet Take 1,000 mcg by mouth daily.    Marland Kitchen zolpidem (AMBIEN) 10 MG tablet Take 10 mg by mouth at bedtime as needed for sleep.      No current facility-administered medications for this visit.    REVIEW OF SYSTEMS:   10 Point review of Systems was done is negative except as noted above.  PHYSICAL EXAMINATION:  Vitals:   01/17/21 1142  BP: (!) 154/72  Pulse: (!) 58  Resp: 18  Temp: (!) 97 F (36.1 C)  SpO2: 95%   Filed Weights   01/17/21 1142  Weight: 274 lb 8 oz (124.5 kg)   .Body mass index is 40.54 kg/m.  Exam was given in a chair.   GENERAL:alert, in no acute distress and  comfortable SKIN: no acute rashes, no significant lesions EYES: conjunctiva are pink and non-injected, sclera anicteric OROPHARYNX: MMM, no exudates, no oropharyngeal erythema or ulceration NECK: supple, no JVD LYMPH:  no palpable lymphadenopathy in the cervical, axillary or inguinal regions LUNGS: clear to auscultation b/l with normal respiratory effort HEART: regular rate & rhythm ABDOMEN:  normoactive bowel sounds , non tender, not distended. Extremity: no pedal edema PSYCH: alert & oriented x 3 with fluent speech NEURO: no focal motor/sensory deficits  LABORATORY DATA:  I have reviewed the data as listed  . CBC Latest Ref Rng & Units 01/10/2021 05/07/2020 01/12/2020  WBC 4.0 - 10.5 K/uL 6.7 7.4 7.9  Hemoglobin 13.0 - 17.0 g/dL 12.8(L) 11.9(L) 12.8(L)  Hematocrit 39.0 - 52.0 % 40.5 35.9(L) 39.6  Platelets 150 - 400 K/uL 217 231.0 196    . CMP Latest Ref Rng & Units 01/10/2021 01/12/2020 05/03/2019  Glucose 70 - 99 mg/dL 101(H) 94 89  BUN 8 - 23 mg/dL 11 14 14   Creatinine 0.61 - 1.24 mg/dL 0.91 0.83 1.00  Sodium 135 - 145 mmol/L 137 139 138  Potassium 3.5 - 5.1 mmol/L 4.3 4.2 4.3  Chloride 98 - 111 mmol/L 102 101 94(L)  CO2 22 - 32 mmol/L 28 27 27   Calcium 8.9 - 10.3 mg/dL 9.0 9.0 9.6  Total Protein 6.5 - 8.1 g/dL 7.0 7.4 6.7  Total Bilirubin 0.3 - 1.2 mg/dL 1.0 1.0 0.5  Alkaline Phos 38 - 126 U/L 58 59 63  AST 15 - 41 U/L 15 18 16   ALT 0 - 44 U/L 9 13 11  RADIOGRAPHIC STUDIES: I have personally reviewed the radiological images as listed and agreed with the findings in the report. No results found.  ASSESSMENT & PLAN:   75 y.o. male with   1. IgG Lambda MGUS Labs upon initial presentation from 07/05/18, HGB at 12.7, M Protein at 0.4, Creatinine normal at 0.94, Calcium normal at 9.0 10/28/17 Bone Survey revealed Negative for lytic or sclerotic lesion. Degenerative change throughout the spine is worst in the cervical and lumbar spine.  Atherosclerosis  PLAN: -Discussed pt labs, 01/10/2021; blood counts normal and improved over last year, chemistries normal, MMP unchanged with M spike at 0.3g/dl. Pt is very stable at this time. -Advised pt he has been three years stable and is continuing to be. -Advised pt that his Hgb of 12.8 is stable and normal.  -There is no lab or clinical evidence of MGUS progression at this time.  -Recommended pt get steam inhaler (Pur Mist) for helping with hoarse voice and a humidifier overnight. Advised pt it could be due to postnasal drip or certain medication he is on. -Will see back as needed. Will consolidate care and have PCP monitor labs at this time. Would recommend yearly SPEP with IFE , cbc/diff, cmp to monitor his MGUS. Reconsult [place] if M spike >1g/dl or other cytopenias/CRAB criteria develop.   FOLLOW UP: RTC with PCP    The total time spent in the appointment was 20 minutes and more than 50% was on counseling and direct patient cares.  All of the patient's questions were answered with apparent satisfaction. The patient knows to call the clinic with any problems, questions or concerns.   Sullivan Lone MD Vina AAHIVMS [facility] Hematology/Oncology Physician [facility]  (Office):       [phone] (Work cell):  [phone] (Fax):           [phone]  01/17/2021 11:35 AM  I, Reinaldo Raddle, am acting as scribe for Dr. Sullivan Lone, MD.     .I have reviewed the above documentation for accuracy and completeness, and I agree with the above. Brunetta Genera MD

## 2021-01-17 ENCOUNTER — Other Ambulatory Visit: Payer: Self-pay

## 2021-01-17 ENCOUNTER — Inpatient Hospital Stay (HOSPITAL_BASED_OUTPATIENT_CLINIC_OR_DEPARTMENT_OTHER): Payer: Medicare Other | Admitting: Hematology

## 2021-01-17 VITALS — BP 154/72 | HR 58 | Temp 97.0°F | Resp 18 | Ht 69.0 in | Wt 274.5 lb

## 2021-01-17 DIAGNOSIS — D472 Monoclonal gammopathy: Secondary | ICD-10-CM | POA: Diagnosis not present

## 2021-02-06 ENCOUNTER — Other Ambulatory Visit: Payer: Self-pay | Admitting: Cardiology

## 2021-03-06 ENCOUNTER — Telehealth (INDEPENDENT_AMBULATORY_CARE_PROVIDER_SITE_OTHER): Payer: Medicare Other | Admitting: Cardiology

## 2021-03-06 ENCOUNTER — Other Ambulatory Visit: Payer: Self-pay

## 2021-03-06 ENCOUNTER — Encounter: Payer: Self-pay | Admitting: Cardiology

## 2021-03-06 ENCOUNTER — Telehealth: Payer: Self-pay | Admitting: Cardiology

## 2021-03-06 VITALS — BP 170/92 | HR 64 | Ht 69.0 in | Wt 282.0 lb

## 2021-03-06 DIAGNOSIS — I5032 Chronic diastolic (congestive) heart failure: Secondary | ICD-10-CM

## 2021-03-06 DIAGNOSIS — I2510 Atherosclerotic heart disease of native coronary artery without angina pectoris: Secondary | ICD-10-CM

## 2021-03-06 DIAGNOSIS — E785 Hyperlipidemia, unspecified: Secondary | ICD-10-CM | POA: Diagnosis not present

## 2021-03-06 DIAGNOSIS — G4733 Obstructive sleep apnea (adult) (pediatric): Secondary | ICD-10-CM

## 2021-03-06 DIAGNOSIS — I10 Essential (primary) hypertension: Secondary | ICD-10-CM

## 2021-03-06 DIAGNOSIS — I420 Dilated cardiomyopathy: Secondary | ICD-10-CM

## 2021-03-06 NOTE — Progress Notes (Signed)
Virtual Visit via Video Note   This visit type was conducted due to national recommendations for restrictions regarding the COVID-19 Pandemic (e.g. social distancing) in an effort to limit this patient's exposure and mitigate transmission in our community.  Due to his co-morbid illnesses, this patient is at least at moderate risk for complications without adequate follow up.  This format is felt to be most appropriate for this patient at this time.  All issues noted in this document were discussed and addressed.  A limited physical exam was performed with this format.  Please refer to the patient's chart for his consent to telehealth for [facility].   Evaluation Performed:  Follow-up visit  This visit type was conducted due to national recommendations for restrictions regarding the COVID-19 Pandemic (e.g. social distancing).  This format is felt to be most appropriate for this patient at this time.  All issues noted in this document were discussed and addressed.  No physical exam was performed (except for noted visual exam findings with Video Visits).  Please refer to the patient's chart (MyChart message for video visits and phone note for telephone visits) for the patient's consent to telehealth for [facility]  Date:  03/06/2021   ID:  Keith Bernard, DOB [birth year], MRN [id number]  Patient Location:  Home  Provider location:   [place]  PCP:  [name], MD  Cardiologist: Fransico Him, MD Electrophysiologist:  None   Chief Complaint:  OSA, CAD, DCM  History of Present Illness:    Keith Bernard is a 75 y.o. male who presents via audio/video conferencing for a telehealth visit today.    Keith Bernard is a 75 y.o. male with a hx of moderateOSA on PAP therapy, ASCAD(status post STEMI anterior with PCI with bare-metal stent of the LAD on 04/12/2009 with cath also showing 20% proximal RCA, 30% mid RCA with ischemic cardiomyopathy with a component of alcohol induced  cardiomyopathy as well), HTN,mixed ischemic/nonischemic DCM with normalized EF on echo 2010,obesity and dyslipidemia. He has chronic LE edema from his neuropathy but this is stable and well controlled on diuretics.He has moderate obstructive sleep apnea with an AHI of 17.7/h and now on auto BiPAP.  He is here today for followup and is doing well.  He has chronic RLE from his drop foot and neuropathy that is stable.  He has some DOE when exerting himselft related to his asthma.  He is followed by pulmonary for his asthma. He denies any chest pain or pressure, SOB, DOE, PND, orthopnea, LE edema, dizziness, palpitations or syncope. He is compliant with his meds and is tolerating meds with no SE.    He is doing well with his PAP device and thinks that he has gotten used to it.  He tolerates the mask and feels the pressure is adequate.  Since going on PAP he feels rested in the am and has no significant daytime sleepiness.  He denies any significant mouth or nasal dryness or nasal congestion.  He does not think that he snores.    The patient does not have symptoms concerning for COVID-19 infection (fever, chills, cough, or new shortness of breath).   Prior CV studies:   The following studies were reviewed today:  Pap download for compliance, labs  Past Medical History:  Diagnosis Date  . Asthma   . Chronic diastolic CHF (congestive heart failure) ([place])   . Chronic kidney disease (CKD), stage III (moderate) (HCC)   . Chronic pain   .  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 Depression 05/04/2016  . DJD (degenerative joint disease)   . Dyslipidemia   . ETOH abuse   . Foot ulcer ([place]) 07/07/2017  . GERD (gastroesophageal reflux disease)   . Hypertension   . Neuropathy   . Obesity   . OSA (obstructive sleep apnea)    Moderate OSA w AHI  17.7/hr now on VPAP at EEP at 7cm H2O, min PS 3cm H2O,max PS 15cm H2O  . RBBB    Past Surgical History:  Procedure Laterality Date  . CORONARY STENT PLACEMENT    . REPLACEMENT TOTAL KNEE     left knee     Current Meds  Medication Sig  . albuterol (PROVENTIL) (2.5 MG/3ML) 0.083% nebulizer solution Take 3 mLs (2.5 mg total) by nebulization every 6 (six) hours as needed for wheezing or shortness of breath.  Marland Kitchen aspirin 81 MG tablet Take 81 mg by mouth daily.  Marland Kitchen atorvastatin (LIPITOR) 20 MG tablet Take 1 tablet (20 mg total) by mouth daily. Please make yearly appt with Dr. Radford Pax for June 2022 before anymore refills. Thank you 1st attempt  . Budeson-Glycopyrrol-Formoterol (BREZTRI AEROSPHERE) 160-9-4.8 MCG/ACT AERO Inhale 2 puffs into the lungs 2 (two) times daily.  Marland Kitchen CALCIUM PO Take by mouth 2 (two) times daily.   . Cholecalciferol (VITAMIN D3) 1000 units CAPS Take 1,000 Units by mouth.  . clomiPHENE (CLOMID) 50 MG tablet Take 50 mg by mouth daily.  . CO ENZYME Q-10 PO Take by mouth daily.   Mariane Baumgarten Sodium (DSS) 100 MG CAPS Take by mouth.  . escitalopram (LEXAPRO) 20 MG tablet Take 20 mg by mouth daily.  Marland Kitchen esomeprazole (NEXIUM) 40 MG capsule Take 40 mg by mouth daily before breakfast.  . fenofibrate 160 MG tablet TAKE 1 TABLET BY MOUTH  DAILY WITH FOOD  . furosemide (LASIX) 20 MG tablet Take 1 tablet (20 mg total) by mouth every other day.  . gabapentin (NEURONTIN) 300 MG capsule Take 300 mg by mouth 3 (three) times daily.  Marland Kitchen LORazepam (ATIVAN) 1 MG tablet Take 1 mg by mouth every 8 (eight) hours.  . Magnesium 250 MG TABS Take 1 tablet by mouth daily.  . metoprolol tartrate (LOPRESSOR) 25 MG tablet TAKE 1 TABLET BY MOUTH  TWICE DAILY  . Misc Natural Products (COLON CLEANSE PO) Take by mouth.   . Misc. Devices [place] brace. He has a double upright brace that is no longer enough support for the arthritis and instability of his midfoot and ankle on his right.  It is in lieu of surgical  correction.  . montelukast (SINGULAIR) 10 MG tablet TAKE 1 TABLET BY MOUTH AT  BEDTIME  . nitroGLYCERIN (NITROSTAT) 0.4 MG SL tablet Place 1 tablet (0.4 mg total) under the tongue every 5 (five) minutes as needed.  . Oxycodone HCl 10 MG TABS Take 10 mg by mouth every 6 (six) hours as needed.  Marland Kitchen Spacer/Aero-Holding Chambers (AEROCHAMBER MV) inhaler Use as instructed  . tamsulosin (FLOMAX) 0.4 MG CAPS capsule Take 0.4 mg by mouth daily.   Marland Kitchen thiamine 100 MG tablet Take by mouth.  . triamcinolone (KENALOG) 0.1 % paste Use as directed 1 application in the mouth or throat in the morning and at bedtime. Throat/tongue  . triamcinolone (NASACORT) 55  MCG/ACT AERO nasal inhaler 2 puffs in each nostril  . vitamin B-12 (CYANOCOBALAMIN) 1000 MCG tablet Take 1,000 mcg by mouth daily.  Marland Kitchen zolpidem (AMBIEN) 10 MG tablet Take 10 mg by mouth at bedtime as needed for sleep.      Allergies:   Patient has no known allergies.   Social History   Tobacco Use  . Smoking status: Never Smoker  . Smokeless tobacco: Never Used  Vaping Use  . Vaping Use: Never used  Substance Use Topics  . Alcohol use: Yes    Alcohol/week: 0.0 standard drinks    Comment: Moderate, beer  . Drug use: No     Family Hx: The patient's family history includes COPD in his mother; Other in his cousin; Pancreatic cancer in his father.  ROS:   Please see the history of present illness.     All other systems reviewed and are negative.   Labs/Other Tests and Data Reviewed:    Recent Labs: 01/10/2021: ALT 9; BUN 11; Creatinine 0.91; Hemoglobin 12.8; Platelets 217; Potassium 4.3; Sodium 137   Recent Lipid Panel Lab Results  Component Value Date/Time   CHOL 115 06/05/2020 01:45 PM   TRIG 134 06/05/2020 01:45 PM   HDL 50 06/05/2020 01:45 PM   CHOLHDL 2.3 06/05/2020 01:45 PM   CHOLHDL 1.9 05/04/2016 10:29 AM   LDLCALC 42 06/05/2020 01:45 PM   LDLDIRECT 33.0 05/07/2015 12:33 PM    Wt Readings from Last 3 Encounters:  03/06/21  282 lb (127.9 kg)  01/17/21 274 lb 8 oz (124.5 kg)  11/13/20 286 lb 12.8 oz (130.1 kg)     Objective:    Vital Signs:  BP (!) 170/92   Pulse 64   Ht 5\' 9"  (1.753 m)   Wt 282 lb (127.9 kg)   BMI 41.64 kg/m   Well nourished, well developed male in no acute distress. Well appearing, alert and conversant, regular work of breathing,  good skin color  Eyes- anicteric mouth- oral mucosa is pink  neuro- grossly intact skin- no apparent rash or lesions or cyanosis   ASSESSMENT & PLAN:    1.  ASCHD  -status post anterior MI with PCI with bare-metal stent of LAD on 04/12/2009.  Cath at that time also showed nonobstructive disease with 20% proximal RCA and 30% mid RCA.   -he has not had any anginal symptoms -continue Lopressor 25mg  BID, ASA and statin   2.  Hypertension  -BP is elevated on exam today but he says that he did not sleep well last night -usually his BP runs 128/43mmHg at home -continue Lopressor 25mg  BID  3.  Dilated cardiomyopathy  -this is felt to be mixed ischemic/nonischemic.   -His most recent echo 10/19/2018 showed normal LV function with EF 55 to 60% with grade 2 diastolic dysfunction.   -continue BB  4.  Hyperlipidemia  -his LDL goal is less than 70.  -His LDL was 42 in July 2021 -continue Atorvastatin 20mg  daily and fenofibrate 160mg  daily  5.  OSA - The patient is tolerating PAP therapy well without any problems.  The patient has been using and benefiting from PAP use and will continue to benefit from therapy.  -I will get a download from his DME  6.  Chronic diastolic CHF -he has not had any SOB and he has not had any LE edema except for his chronic RLE that is from his neuropathy -2D echocardiogram done 09/2018 showed normal LV function.   -continue Lasix 20mg  QOD -  creatinine was stable at 0.91 in Feb 2022   Medication Adjustments/Labs and Tests Ordered: Current medicines are reviewed at length with the patient today.  Concerns regarding medicines are  outlined above.  Tests Ordered: No orders of the defined types were placed in this encounter.  Medication Changes: No orders of the defined types were placed in this encounter.   Disposition:  Follow up in 6 month(s)  Signed, Fransico Him, MD  03/06/2021 10:22 AM    [facility]

## 2021-03-06 NOTE — Telephone Encounter (Signed)
Ok to switch to virtual

## 2021-03-06 NOTE — Telephone Encounter (Signed)
Keith Bernard is calling requesting Valentine's appointment for today be made virtual due to him having a sore throat. I called Dr. Theodosia Blender ext requesting permission to make this change, but they were unsure due to Dr. Radford Pax not coming in until 9 am. Stated she would ask Dr. Radford Pax once she arrives and call the patient's wife back to let her know. Please advise.

## 2021-03-06 NOTE — Patient Instructions (Signed)
Medication Instructions:  No changes.  Labwork: None ordered.  Testing/Procedures: None ordered.  Follow-Up: Your physician recommends that you schedule a follow-up appointment in:   12 months with Dr. Radford Pax  Any Other Special Instructions Will Be Listed Below (If Applicable).     If you need a refill on your cardiac medications before your next appointment, please call your pharmacy.

## 2021-03-19 ENCOUNTER — Telehealth: Payer: Self-pay | Admitting: *Deleted

## 2021-03-19 NOTE — Telephone Encounter (Signed)
-----   Message from [name], MD sent at 03/06/2021 10:21 AM EDT ----- Please get a download from DME

## 2021-03-19 NOTE — Telephone Encounter (Signed)
Compliance good - AHI borderline but likely related to mask leak - make sure he is changing cushion every 4 weeks and getting new headgear every 6 to 12 months.

## 2021-03-19 NOTE — Telephone Encounter (Signed)
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) [place], [place] Compliance Report Usage 12/19/2020 - 03/18/2021 Usage days 87/90 days (97%) >= 4 hours 9 days (10%) < 4 hours 78 days (87%) Usage hours 195 hours 53 minutes Average usage (total days) 2 hours 11 minutes Average usage (days used) 2 hours 15 minutes Median usage (days used) 2 hours 4 minutes S9 VPAP Auto Serial number [id number] Mode VAuto Max IPAP 20 cmH2O Min EPAP 4 cmH2O Pressure Support 4 cmH2O Therapy Leaks - L/min Median: 12.3 95th percentile: 44.6 Maximum: 77.9 Events per hour AI: 4.9 HI: 0.1 AHI: 5.0 Apnea Index Central: 0.2 Obstructive: 1.3 Unknown: 3.4 Usage - hours Printed

## 2021-03-27 NOTE — Telephone Encounter (Signed)
Patient understands his AHI showed normal at 5.0.  Pt is aware and agreeable to recommendations made by Dr. Radford Pax and  understanding was verbalized.

## 2021-03-28 ENCOUNTER — Telehealth: Payer: Self-pay | Admitting: *Deleted

## 2021-03-28 NOTE — Telephone Encounter (Signed)
Patient understands his AHI showed normal at 5.0. Pt is aware and agreeable to improving in compliance.

## 2021-03-28 NOTE — Telephone Encounter (Signed)
-----   Message from [name], MD sent at 03/24/2021  1:27 PM EDT ----- Good AHI on PAP but needs to improve compliance

## 2021-04-24 ENCOUNTER — Other Ambulatory Visit: Payer: Self-pay | Admitting: Cardiology

## 2021-04-29 ENCOUNTER — Other Ambulatory Visit: Payer: Self-pay | Admitting: Cardiology

## 2021-05-07 ENCOUNTER — Other Ambulatory Visit: Payer: Self-pay | Admitting: Internal Medicine

## 2021-05-21 ENCOUNTER — Other Ambulatory Visit: Payer: Self-pay | Admitting: Cardiology

## 2021-05-23 ENCOUNTER — Telehealth: Payer: Self-pay | Admitting: Cardiology

## 2021-05-23 MED ORDER — NITROGLYCERIN 0.4 MG SL SUBL: 0.4000 mg | SUBLINGUAL_TABLET | SUBLINGUAL | 3 refills | Status: DC | PRN

## 2021-05-23 NOTE — Telephone Encounter (Signed)
Pt's medication has been sent to pt's pharmacy as requested. Confirmation received.

## 2021-05-23 NOTE — Telephone Encounter (Signed)
*  STAT* If patient is at the pharmacy, call can be transferred to refill team.   1. Which medications need to be refilled? (please list name of each medication and dose if known)  nitroGLYCERIN (NITROSTAT) 0.4 MG SL tablet  2. Which pharmacy/location (including street and city if local pharmacy) is medication to be sent to?  [place], [address] AT [address]  3. Do they need a 30 day or 90 day supply? [place]

## 2021-05-29 ENCOUNTER — Other Ambulatory Visit: Payer: Self-pay | Admitting: Cardiology

## 2021-06-19 ENCOUNTER — Telehealth: Payer: Self-pay | Admitting: Internal Medicine

## 2021-06-19 MED ORDER — BREZTRI AEROSPHERE 160-9-4.8 MCG/ACT IN AERO: 2.0000 | INHALATION_SPRAY | Freq: Two times a day (BID) | RESPIRATORY_TRACT | 0 refills | Status: DC

## 2021-06-19 NOTE — Telephone Encounter (Signed)
Called and spoke with patient. He was calling to see if we had any samples of Breztri. He is completely out and no sure when he will receive his next shipment from patient assistance. I advised him that I would leave 2 samples up front for him. He verbalized understanding.   Samples have been placed at the desk.   Nothing further needed at time of call.

## 2021-07-16 ENCOUNTER — Other Ambulatory Visit: Payer: Self-pay

## 2021-07-16 ENCOUNTER — Encounter: Payer: Self-pay | Admitting: Internal Medicine

## 2021-07-16 ENCOUNTER — Ambulatory Visit: Payer: Medicare Other | Admitting: Internal Medicine

## 2021-07-16 VITALS — BP 132/86 | HR 91 | Temp 99.0°F | Ht 68.0 in | Wt 283.4 lb

## 2021-07-16 DIAGNOSIS — G4733 Obstructive sleep apnea (adult) (pediatric): Secondary | ICD-10-CM

## 2021-07-16 DIAGNOSIS — R9389 Abnormal findings on diagnostic imaging of other specified body structures: Secondary | ICD-10-CM

## 2021-07-16 DIAGNOSIS — Z9989 Dependence on other enabling machines and devices: Secondary | ICD-10-CM

## 2021-07-16 DIAGNOSIS — R490 Dysphonia: Secondary | ICD-10-CM

## 2021-07-16 DIAGNOSIS — J4540 Moderate persistent asthma, uncomplicated: Secondary | ICD-10-CM

## 2021-07-16 DIAGNOSIS — R0602 Shortness of breath: Secondary | ICD-10-CM

## 2021-07-16 MED ORDER — ALBUTEROL SULFATE HFA 108 (90 BASE) MCG/ACT IN AERS: 2.0000 | INHALATION_SPRAY | Freq: Four times a day (QID) | RESPIRATORY_TRACT | 6 refills | Status: DC | PRN

## 2021-07-16 MED ORDER — BUDESONIDE-FORMOTEROL FUMARATE 80-4.5 MCG/ACT IN AERO: 2.0000 | INHALATION_SPRAY | Freq: Two times a day (BID) | RESPIRATORY_TRACT | 3 refills | Status: DC

## 2021-07-16 NOTE — Addendum Note (Signed)
Addended by: Fenton Foy on: 07/16/2021 02:31 PM   Modules accepted: Orders

## 2021-07-16 NOTE — Patient Instructions (Addendum)
Moderate persistent asthma without complication with elevated IgE  - clinically well controlled but having hoarse voice from breztri - you are breztri through [place] and [place] progra because symbicort is no longer available in this program though symbicort worked well for you  Plan  - continue breztri  - take prescription to [facility] in [place] for Symbicort 80/4.52 puffs twice daily  -If you cannot for this use this -Use albuterol 2 puffs as needed                     -Take new prescription.  If you cannot for this use this otherwise use your family's  - if asthma spins out of control, can consider xolair   OSA on CPAP  - cotninue cpap through sleep doc    Abnormal CT of the chest Sept 2021 - early ILA otherwise call interstitial lung abnormalities  Plan -Do repeat high-resolution CT chest in 3-6 months  Hoarseness of voice -new in September 2021.  For history 2 times ENT evaluation negative  -Currently the hoarseness of voice is worse.  This is probably because of the inhaler  Plan -See above asthma section for inhaler change plan  Follow-up -3 to 6 months or sooner if needed [ACT and exhaled  nitric oxide testing at follow-up)  - but after HRCT

## 2021-07-16 NOTE — Progress Notes (Signed)
OV 01/29/2016  Chief Complaint  Patient presents with   Follow-up    Pt here after acute visit with SG on 2.10.17. Pt states his breathing has imrpoved since last OV. Pt states he has occassional wheezing and mild non prod cough. Pt denies CP/tightness.     Keith Bernard returns for chronic obstructive lung disease asthma follow-up. He is now on Dulera plus Singulair. He is compliant with Dulera. He says with improved compliance his symptoms are a lot better. His wife is here with him. Most recently he had a flareup and was treated with prednisone. I review the chart when he was seen by Keith Bernard gross my nurse practitioner. Currently he feels asthma is well controlled. However he still uses albuterol multiple times a day for rescue but denies any nocturnal symptoms or daytime symptoms. He is compliant with this CPAP for sleep apnea.  Review of the chart shows this is in December 2012 has elevated eos 400 cells per cubic millimeter. I notice that and IgE was never checked. In any event subjectively is well controlled currently. Also noted on alpha-1 has not been checked.  Asthma control questionnaire shows that at night he hardly ever wakes up because of asthma. When he wakes up he has very mild symptoms. In the daytime he is moderately limited because of asthma although this could be because of obesity. He also has moderate shortness of breath because of asthma although this could be because of obesity. He is wheezing a lot of the time and uses albuterol 3-4 puffs on most days. Nevertheless average score of 2.2 show significant improvement after starting [name] 05/04/2016  Chief Complaint  Patient presents with   Follow-up    Pt c/o occasional wheeze since not being on Breo for about a week. Pt has been using albuterol HFA more. Pt denies cough/SOB/CP/tightness.    Follow-up asthma. Last seen March 2017. This is a routine follow-up. I put him on Breo when she really liked. However the  cost dollars out of pocket because he is in the donut hole. So he ran out of it over a week ago and is having increased symptoms. Asthma control questionnaire is 2.0 and still within his range from prior visits. Exhaled nitric oxide is just over 25 suggests some amount of active airway inflammation. His wife is here with him and she says that she got approved for the [facility] patient assistance program but apparently between our office in the mailroom the paperwork has been loss. She is willing to redo the paperwork and she is wondering if I could sign my part of it this week.  In terms of symptoms specifically tells me that he does wake up a few times at night. When he wakes up he has very mild symptoms. Slightly limited in his activities because of asthma. He has moderate amount of shortness of breath with exertion. He does wheeze a little of the time and is using albuterol for rescue at least one or 2 times daily. 5 point score is 2/.0 showing activity - is worse this past week or so is within normal   OV 01/04/2017   Chief Complaint  Patient presents with   Follow-up    Pt states last week he had an 'asthma attack' and went to ED in [place] and was given steroid and abx per pt's wife. Pt states he feels he is improving. Pt states he does not feel back to baseline. Pt denies  CP/tightness and f/c/s.     Follow-up moderate persistent asthma  Last seen June 2017. He presents with his wife as always. He is on Dulera through the assistance program. Some 3 weeks ago his sister died from terminal cancer. Take a flight to [place]. His been dealing with those emotions. Because of the travel, emotions and weather change some 10 days ago he had an asthma exacerbation but never below few days with increased cough and wheezing. He went to a local emergency department and got one shot of steroids and was given 7 days of cephalexin which is completed. Despite this is not fully better. He still has some cough  and wheeze and yellow sputum. There is no fever.   OV 07/07/2017  Chief Complaint  Patient presents with   Follow-up    Pt using Dulera, still having trouble getting this covered by insurance and getting Financial Assistance/Patient Assistance. Pt states that his breathing is doing better after senc round of Abx/Prednisone.      Follow-up moderate persistent asthma   Last seen February 2018. He presents with his wife. Overall he is doing stable except with the summer heat his symptoms are slightly more than usual as seen in the asthma control questionnaire. He says that he does not wake up at night because of asthma. When he wakes up he has mild symptoms. He has limitation in his activities because of asthma but this could also be because of obesity and DJD and spine issues. He does get dyspneic for moderate amount of the time and is wheezing a lot of the time but using albuterol for rescue 1-2 puffs per day. He's having significant difficulty getting [employer] to pay for his assistance program and is in need of samples today. He continues on Singulair.   OV 12/09/2017  Chief Complaint  Patient presents with   Follow-up    shortness of breath   Follow-up moderate persistent asthma  Routine 35-monthfollow-up.  He presents with his wife.  Overall he is doing fine.  He has significant symptoms on his asthma control questionnaire.  In fact his symptoms are slowly deteriorated.  Average score is 2.8.  But this resolved mostly shortness of breath.  He feels is due to obesity.  His nitric oxide is 12 showing good asthma control on Dulera and Singulair.  He plans to lose weight.  Recently had a fall and hurt the shoulder.  Review of the images show is not had a chest x-ray since 2010.  He is up-to-date with his flu shot.  In terms of his asthma control questionnaire he hardly ever wakes up at night.  When he wakes up he has moderate symptoms.  He is limited with his activities moderately and does express  a moderate amount of shortness of breath but he does wheeze occasionally.  He is using albuterol for rescue multiple times a day.  =  OV 08/05/2018  Subjective:  Patient ID: Keith Bernard male , DOB: [birth year], age 75y.o. , MRN: [id number], [address] [address] [id number]  08/05/2018 -   Chief Complaint  Patient presents with   Follow-up    Pt has been sick and was put on prednisone and abx which he began Monday, 9/2.  Pt has c/o cough with green mucus, increased SOB. Denies any CP or chest tightness.     HPI Keith SHIMABUKURO89y.o. -moderate persistent asthma patient here for follow-up. He is here  with his wife. They tell me that has been many months since he has taken his inhale corticosteroid Dulera because the patient assistance program has been slow to respond.He is relying on sampples. Apparently got some Symbicort from usat some point in the past since he last saw me. However he has run out of that as well. He is not sure how long it is since he ran out of that. So he is having a flareup. He is just dependent on prednisone for the last 5 days with antibiotic symptoms by our nurse practitioner.He does not feel fully back to baseline. His asthma control question is 3.2 showing significant nocturnal symptoms or albuterol rescue use OF breath and wheezing. In fact on exam he had some wheezing is exhaled nitric oxide was 28 while on prednisone       OV 11/11/2018  Subjective:  Patient ID: Keith Bernard, male , DOB: [birth year] , age 78 y.o. , MRN: [id number] , ADDRESS: [place] [id number]   11/11/2018 -   Chief Complaint  Patient presents with   Follow-up    f/u asthma. Pt states he has had a recent flare up, wheezing has increased.   Moderate persistent asthma for follow-up.  He is on [place] and Singulair  HPI Keith Bernard 75 y.o. -for the last 3 days he is reporting increased cough with congestion and some mild increase  in wheezing with green sputum.  Asthma control questionnaire shows that he is waking up a few times at night.  When he wakes up he is quite severe symptoms he is very limited in his activities because of asthma experiencing a very great deal of shortness of breath and wheezing all the time using albuterol for rescue 5 times daily.  However nitric oxide test is normal.  Clinically is not wheezing actively.  Recently had edema and my nurse practitioner started him on Lasix.  He tells me there are a bunch of cats at home and also significant mold exposure.  The wife is interested in allergy testing but they do not have the financial resources to get rid of the cats or get rid of the mold to sell the house.  They are frustrated by all this.  He says today he will be content taking antibiotic and if it gets worse doing prednisone.  Last chest x-ray October 2019.  He is never had CT chest.       OV 09/19/2019  Subjective:  Patient ID: Keith Bernard, male , DOB: Nov 02, 1946 , age 22 y.o. , MRN: [id number] , ADDRESS: [place] [id number]   09/19/2019 -   follow-up obstructive lung disease.  To person identifier used to identify the patient on this telephone visit.  Risks, benefits and limitations explained.  Moderate persistent asthma for follow-up.  He is on Dulera and Singulair History of exposure to the mold   HPI Keith Bernard 75 y.o. -presents for this telephone visit.  He says he is doing well.  Socially distancing and trying to avoid COVID-19.  The main purpose of the call is that he wants a refill on the Singulair and was told to make a telephone visit.  He says his symptoms are really well controlled.  Last year he got rid of the mold in the house.  He save money and got this done.  He still has some kittens with is not bothering him.  He says her asthma is  really much better after getting rid of the mold.  He continues on Dulera and Singulair.  Is up-to-date with his  flu shot.  He has some baseline shortness of breath and neuropathy.  He is compliant with his CPAP for sleep apnea.  There are no other new problems     ROS - per HPI     OV 05/07/2020  Subjective:  Patient ID: Keith Bernard, male , DOB: September 24, 1946 , age 19 y.o. , MRN: [id number] , ADDRESS: [place] [id number]   05/07/2020 -   Chief Complaint  Patient presents with   Follow-up    Pt states he has been doing okay since last visit. Pt states that he does become SOB due to being overweight.   Moderate persistent asthma for follow-up.  He is on Dulera and Singulair History of exposure to the mold - cleaned house spring 2021   HPI DOLPH TAVANO 75 y.o. -returns for follow-up.  He presents with his wife.  Wife is giving most of the history and is having to redirect the husband.  As best as I can gather it appears the asthma is well controlled.  However the ACT score is 14 suggesting poor control.  In talking to him I find out that he is using his albuterol multiple times a day.  He thinks this is a habit but also is using it because of sore throat.  He also states that he is also using it for shortness of breath.  He is not having any nocturnal symptoms of wheezing.  He just uses albuterol randomly at the daytime.  However wife and he overall feels asthma is well controlled with Dulera and Singulair.  They believe the Ruthe Mannan is a life saver.  Asking for charity support of filling a form for [facility].  In terms of his mold exposure he has gotten rid of the mold.  He feels masking and social distancing has helped his respiratory quality of life.   OV 11/13/2020   Subjective:  Patient ID: Keith Bernard, male , DOB: 12-16-1945, age 88 y.o. years. , MRN: [id number],  ADDRESS: [place] [id number] PCP  [name], MD Providers : Treatment Team:  Attending Provider: Brand Males, MD Patient Care Team: [name], MD as PCP -  General (Family Medicine) [name], MD as Consulting Physician (Cardiology)    Chief Complaint  Patient presents with   Follow-up    Patient is hoarse all the time and they are concerned about it. Patient states he would like to stay on Breztri. Both feet seem to be swollen, patient states that he is tired all the time and sleeps a lot during the day.     Moderate persistent asthma for follow-up.  He is on Dulera and Singulair> changed toBREZTRI summer/fall 2021 History of exposure to the mold - cleaned house spring 2021 Elevated IgE level June 2021 with eosinophils 200 cells per cubic millimeter June 2021  OSA on CPAP Morbid obesity uses a cane Early interstitial lung abnormalities on September 2021 high-resolution CT chest Elevation of left hemidiaphragm noted on September 2021 CT chest   HPI Keith Bernard 75 y.o. -returns for follow-up with his wife.  Overall he feels stable.  His biggest issue is that over the last few to several months he has had worsening of his chronic intermittent hoarseness of voice.  It is now persistent.  He states one of his  family relatives had vocal cord nodules.  Therefore he is concerned.  He feels his asthma is well controlled with his current inhaler regimen.  He is now on triple inhaler therapy.  He says it took a while for him to get used to it but he says he likes it now.  In terms of his sleep apnea is continuing on CPAP.  After last visit I checked a blood IgE and eosinophils.  His IgE slightly elevated.  His high-resolution CT chest shows evidence of early interstitial lung abnormalities but no clear-cut evidence of ILD/pulmonary fibrosis in my opinion.  Despite feeling his asthma is under control he complains of shortness of breath.  This is probably related to his obesity.  He gets dyspneic when he bends down or when he walks.  He wants to see a nutritionist or undergo some kind of a program where he could lose weight.  Of note: They  wanted me to help filling out the form for charity application for his inhaler therapy from [place].  The program is called [place] and [place]  Asthma Control Test ACT Total Score  05/07/2020 14    Lab Results  Component Value Date   NITRICOXIDE 11 11/11/2018     CT chest HRCT Sept 2021  IMPRESSION: 1. 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 2. Faint patchy ground-glass opacity in the peripheral perilobular right lung base, nonspecific, with the differential including nonspecific minimal postinfectious/postinflammatory scarring versus very mild interstitial lung disease such as NSIP. Consider follow-up high-resolution chest CT study in 12 months. Findings are suggestive of an alternative diagnosis (not UIP) per consensus guidelines: Diagnosis of Idiopathic Pulmonary Fibrosis: An Official ATS/ERS/JRS/ALAT Clinical Practice Guideline. Pierce, Iss 5, [phone], Jul 31 2017. 3. Two-vessel coronary atherosclerosis. 4. Aortic Atherosclerosis (ICD10-I70.0).     Electronically Signed   By: Ilona Sorrel M.D.   On: 08/12/2020 16:03    OV 07/16/2021  Subjective:  Patient ID: Keith Bernard, male , DOB: Mar 19, 1946 , age 50 y.o. , MRN: [id number] , ADDRESS: [place] [id number] PCP [name], MD Patient Care Team: [name], MD as PCP - General (Family Medicine) [name], MD as Consulting Physician (Cardiology)  This Provider for this visit: Treatment Team:  Attending Provider: Brand Males, MD    07/16/2021 -   Chief Complaint  Patient presents with   Follow-up    Pt states throat hurting because of brezatri inhaler, pt states tongue hurting also has hoarseness.    Moderate persistent asthma for follow-up.  He is on Dulera and Singulair> changed toBREZTRI summer/fall 2021 History of exposure to the mold - cleaned house  spring 2021 Elevated IgE level June 2021 with eosinophils 200 cells per cubic millimeter June 2021  OSA on CPAP Morbid obesity uses a cane Early interstitial lung abnormalities on September 2021 high-resolution CT chest Elevation of left hemidiaphragm noted on September 2021 CT chest  HPI Keith Bernard 75 y.o. -he is now on BREZTRI since last year.  This is because of co-pay issues.  He is on a [employer] fast streamer.  He is not able to do Symbicort because it is no longer available on the free program.  It is generic.  He is not even able to afford albuterol he uses his family's albuterol.  The  BREZTRI is controlling his asthma well but he has significant  hoarseness of voice.  In fact compared to last him the hoarseness is gotten worse.  Wife tells me that he is extremely worried about vocal cord cancer.  He has seen to ear nose throat doctors.  Including Dr. Constance Holster was done cultures on him.  Apparently has been reassured.  He also feels reassured there is no vocal cord cancer.  He believes the inhaler is causing the hoarseness of voice.  But at this point in time he wants to just soldier on with current inhaler because of the cost issues.  Of note a year ago his high-resolution CT chest showed some interstitial abnormalities.  His 1 year CT scan follow-up is due September 2022.  We will postpone this.  This because he is feeling stable from a breathing standpoint shortness of breath standpoint.   Asthma Control Test ACT Total Score  07/16/2021 10  05/07/2020 14     Lab Results  Component Value Date   NITRICOXIDE 11 11/11/2018          Asthma Control Panel -December 2020 -  eos 400 cells per cubic millimeter \-> June 2021 200 cells - IgE 235 elevated June 2021 - obstn with low dlco nov 2016 on PFT - normal IgG, IgM and IgA  -feb 2021   - Alpha 1 is MM 08/23/2015  01/29/2016  05/04/2016  07/07/2017  12/09/2017  08/05/2018  11/11/2018  05/07/2020  07/16/2021   Current Med  Regimen symbicort  Dulera  Off breo for several days.  On dulera 100. Struggling to afford and singulair dulera and singulair dulera and singulair dulra and singulair    ACT - a [employer] test - 4 week. Total score. Max is 25, Lower score is worse.  <19 = poor control        14 10  ACQ 5 point- 1 week. wtd avg score. <1.0 is good control 0.75-1.25 is grey zone. >1.25 poor control. Delta 0.5 is clinically meaningful 3.2 2.2 2.0 2.6 2.8 3.2 4.2    FeNO ppB 34 x '26  13 28 '$ on prednisone 11    FeV1  x  x        Planned intervention  for visit Start dulera + singulair Cont dulera      Con breztril sept 2021    Results for DEJA, ITURRALDE (MRN [id number]) as of 11/13/2020 11:28  Ref. Range 05/07/2020 12:13  IgE (Immunoglobulin E), Serum Latest Ref Range: <OR=114 kU/L 235 (H)  Results for JAQUISE, CANARIO (MRN [id number]) as of 11/13/2020 11:28  Ref. Range 05/07/2020 12:13  Eosinophils Absolute Latest Ref Range: 0.0 - 0.7 K/uL 0.2    PFT  PFT Results Latest Ref Rng & Units 06/19/2020 10/17/2015  FVC-Pre L 2.44 2.22  FVC-Predicted Pre % 62 54  FVC-Post L 2.58 2.34  FVC-Predicted Post % 65 57  Pre FEV1/FVC % % 75 75  Post FEV1/FCV % % 79 80  FEV1-Pre L 1.85 1.66  FEV1-Predicted Pre % 64 55  FEV1-Post L 2.04 1.88  DLCO uncorrected ml/min/mmHg 18.83 19.62  DLCO UNC% % 79 66  DLCO corrected ml/min/mmHg 20.59 -  DLCO COR %Predicted % 86 -  DLVA Predicted % 123 121  TLC L 4.78 4.62  TLC % Predicted % 71 69  RV % Predicted % 101 98       has a past medical history of Asthma, Chronic diastolic CHF (congestive heart failure) (HCC), Chronic kidney disease (CKD), stage III (moderate) (HCC), Chronic pain, Coronary artery disease, Depression (  05/04/2016), DJD (degenerative joint disease), Dyslipidemia, ETOH abuse, Foot ulcer ([place]) (07/07/2017), GERD (gastroesophageal reflux disease), Hypertension, Neuropathy, Obesity, OSA (obstructive sleep apnea), and RBBB.   reports that he has never smoked. He has never used  smokeless tobacco.  Past Surgical History:  Procedure Laterality Date   CORONARY STENT PLACEMENT     REPLACEMENT TOTAL KNEE     left knee    No Known Allergies  Immunization History  Administered Date(s) Administered   DTaP 07/22/2001   Fluad Quad(high Dose 65+) 11/04/2016, 08/26/2020   Influenza Split 09/30/2014   Influenza, High Dose Seasonal PF 11/04/2016, 08/17/2017, 11/03/2018, 09/08/2019   Influenza, Seasonal, Injecte, Preservative Fre 08/30/2017   Influenza,inj,Quad PF,6+ Mos 10/23/2015   Influenza-Unspecified 08/30/2017   Moderna Sars-Covid-2 Vaccination 02/08/2020, 03/09/2020   Pneumococcal Conjugate-13 01/02/2015, 10/01/2015   Pneumococcal Polysaccharide-23 04/11/2012   Pneumococcal-Unspecified 04/11/2012   Tdap 12/22/2016   Zoster Recombinat (Shingrix) 07/21/2017, 01/20/2018   Zoster, Live 01/02/2015    Family History  Problem Relation Age of Onset   COPD Mother    Pancreatic cancer Father        pancreatic    Other Cousin        Amyloidosis     Current Outpatient Medications:    albuterol (PROVENTIL) (2.5 MG/3ML) 0.083% nebulizer solution, Take 3 mLs (2.5 mg total) by nebulization every 6 (six) hours as needed for wheezing or shortness of breath., Disp: 75 mL, Rfl: 5   aspirin 81 MG tablet, Take 81 mg by mouth daily., Disp: , Rfl:    atorvastatin (LIPITOR) 20 MG tablet, TAKE 1 TABLET BY MOUTH  DAILY, Disp: 90 tablet, Rfl: 3   Budeson-Glycopyrrol-Formoterol (BREZTRI AEROSPHERE) 160-9-4.8 MCG/ACT AERO, Inhale 2 puffs into the lungs 2 (two) times daily., Disp: 10.7 g, Rfl: 0   Budeson-Glycopyrrol-Formoterol (BREZTRI AEROSPHERE) 160-9-4.8 MCG/ACT AERO, Inhale 2 puffs into the lungs in the morning and at bedtime., Disp: 11.8 g, Rfl: 0   CALCIUM PO, Take by mouth 2 (two) times daily. , Disp: , Rfl:    Cholecalciferol (VITAMIN D3) 1000 units CAPS, Take 1,000 Units by mouth., Disp: , Rfl:    CO ENZYME Q-10 PO, Take by mouth daily. , Disp: , Rfl:    Docusate Sodium  (DSS) 100 MG CAPS, Take by mouth., Disp: , Rfl:    escitalopram (LEXAPRO) 20 MG tablet, Take 20 mg by mouth daily., Disp: , Rfl:    esomeprazole (NEXIUM) 40 MG capsule, Take 40 mg by mouth daily before breakfast., Disp: , Rfl:    fenofibrate 160 MG tablet, TAKE 1 TABLET BY MOUTH  DAILY WITH FOOD, Disp: 90 tablet, Rfl: 3   furosemide (LASIX) 20 MG tablet, TAKE 1 TABLET BY MOUTH  EVERY OTHER DAY, Disp: 45 tablet, Rfl: 3   gabapentin (NEURONTIN) 300 MG capsule, Take 300 mg by mouth 3 (three) times daily., Disp: , Rfl:    LORazepam (ATIVAN) 1 MG tablet, Take 1 mg by mouth every 8 (eight) hours., Disp: , Rfl:    Magnesium 250 MG TABS, Take 1 tablet by mouth daily., Disp: , Rfl:    metoprolol tartrate (LOPRESSOR) 25 MG tablet, TAKE 1 TABLET BY MOUTH  TWICE DAILY, Disp: 180 tablet, Rfl: 2   Misc Natural Products (COLON CLEANSE PO), Take by mouth. , Disp: , Rfl:    Misc. Devices MISC, [place] brace. He has a double upright brace that is no longer enough support for the arthritis and instability of his midfoot and ankle on his right.  It is in  lieu of surgical correction., Disp: , Rfl:    montelukast (SINGULAIR) 10 MG tablet, TAKE 1 TABLET(10 MG) BY MOUTH AT BEDTIME, Disp: 90 tablet, Rfl: 3   nitroGLYCERIN (NITROSTAT) 0.4 MG SL tablet, Place 1 tablet (0.4 mg total) under the tongue every 5 (five) minutes as needed., Disp: 25 tablet, Rfl: 3   Oxycodone HCl 10 MG TABS, Take 10 mg by mouth every 6 (six) hours as needed., Disp: , Rfl:    Spacer/Aero-Holding Chambers (AEROCHAMBER MV) inhaler, Use as instructed, Disp: 1 each, Rfl: 0   tamsulosin (FLOMAX) 0.4 MG CAPS capsule, Take 0.4 mg by mouth daily. , Disp: , Rfl:    thiamine 100 MG tablet, Take by mouth., Disp: , Rfl:    triamcinolone (NASACORT) 55 MCG/ACT AERO nasal inhaler, 2 puffs in each nostril, Disp: , Rfl:    vitamin B-12 (CYANOCOBALAMIN) 1000 MCG tablet, Take 1,000 mcg by mouth daily., Disp: , Rfl:    zolpidem (AMBIEN) 10 MG tablet, Take 10 mg by  mouth at bedtime as needed for sleep. , Disp: , Rfl:       Objective:   Vitals:   07/16/21 1350  BP: 132/86  Pulse: 91  Temp: 99 F (37.2 C)  TempSrc: Oral  SpO2: 96%  Weight: 283 lb 6.4 oz (128.5 kg)  Height: '5\' 8"'$  (1.727 m)    Estimated body mass index is 43.09 kg/m as calculated from the following:   Height as of this encounter: '5\' 8"'$  (1.727 m).   Weight as of this encounter: 283 lb 6.4 oz (128.5 kg).  '@WEIGHTCHANGE'$ @  [employer]   07/16/21 1350  Weight: 283 lb 6.4 oz (128.5 kg)     Physical Exam    General: No distress. Obese. Has RLE in brace Neuro: Alert and Oriented x 3. GCS 15. Speech normal Psych: Pleasant Resp:  Barrel Chest - no.  Wheeze - no, Crackles - no, No overt respiratory distress CVS: Normal heart sounds. Murmurs - no Ext: Stigmata of Connective Tissue Disease - not but RLE in brace. [place] HEENT: Normal upper airway. PEERL +. No post nasal drip. VOICE VERY HOOARSE        Assessment:       ICD-10-CM   1. Moderate persistent asthma without complication  123456     2. OSA on CPAP  G47.33    Z99.89     3. Hoarseness of voice  R49.0     4. Abnormal CT of the chest  R93.89          Plan:     Patient Instructions  Moderate persistent asthma without complication with elevated IgE  - clinically well controlled but having hoarse voice from breztri - you are breztri through [facility] because symbicort is no longer available in this program though symbicort worked well for you  Plan  - continue breztri  - take prescription to [facility] in [place] for Symbicort 80/4.52 puffs twice daily  -If you cannot for this use this -Use albuterol 2 puffs as needed                     -Take new prescription.  If you cannot for this use this otherwise use your family's  - if asthma spins out of control, can consider xolair   OSA on CPAP  - cotninue cpap through sleep doc    Abnormal CT of the chest Sept 2021 - early  ILA otherwise call interstitial lung abnormalities  Plan -Do repeat high-resolution CT chest in 3-6 months  Hoarseness of voice -new in September 2021.  For history 2 times ENT evaluation negative  -Currently the hoarseness of voice is worse.  This is probably because of the inhaler  Plan -See above asthma section for inhaler change plan  Follow-up -3 to 6 months or sooner if needed [ACT and exhaled  nitric oxide testing at follow-up)  - but after HRCT     SIGNATURE    Dr. Brand Males, M.D., F.C.C.P,  Pulmonary and Critical Care Medicine Staff Physician, [place] Director - Interstitial Lung Disease  Program  P[facility] [place] [facility] [place], [place], [address]  Pager: [phone], If no answer or between  15:00h - 7:00h: call [phone] Telephone: [phone]  2:23 PM 07/16/2021

## 2021-07-16 NOTE — Addendum Note (Signed)
Addended by: Lorretta Harp on: 07/16/2021 02:37 PM   Modules accepted: Orders

## 2021-07-30 ENCOUNTER — Ambulatory Visit (HOSPITAL_COMMUNITY)
Admission: RE | Admit: 2021-07-30 | Discharge: 2021-07-30 | Disposition: A | Discharge status: Home / Self Care | Payer: Medicare Other | Source: Ambulatory Visit | Attending: Internal Medicine | Admitting: Internal Medicine

## 2021-07-30 ENCOUNTER — Other Ambulatory Visit: Payer: Self-pay

## 2021-07-30 DIAGNOSIS — R0602 Shortness of breath: Secondary | ICD-10-CM | POA: Diagnosis present

## 2021-07-30 DIAGNOSIS — R9389 Abnormal findings on diagnostic imaging of other specified body structures: Secondary | ICD-10-CM | POA: Diagnosis not present

## 2021-08-05 ENCOUNTER — Other Ambulatory Visit: Payer: Self-pay | Admitting: Cardiology

## 2021-08-12 ENCOUNTER — Ambulatory Visit: Payer: Medicare Other | Admitting: Endocrinology

## 2021-08-13 ENCOUNTER — Ambulatory Visit: Payer: Medicare Other | Admitting: Endocrinology

## 2021-08-15 ENCOUNTER — Telehealth: Payer: Self-pay | Admitting: Cardiology

## 2021-08-15 MED ORDER — NITROGLYCERIN 0.4 MG SL SUBL: 0.4000 mg | SUBLINGUAL_TABLET | SUBLINGUAL | 3 refills | Status: AC | PRN

## 2021-08-15 NOTE — Telephone Encounter (Signed)
Spoke with the patient's wife who states that the patient had a chest CT done by his pulmonologist. She reviewed and saw information about his heart and was wondering if Dr. Radford Pax could review it. The patient's wife also states that the patient complains of chest pain sometimes. She states that he contributes it to indigestion but she is not sure. She states he does not have any other symptoms. Advised patient's wife on when and how to use nitroglycerin. I have also scheduled patient an appointment to see Dr. Radford Pax. Patient's wife has also been advised on ER precautions for chest pain. She verbalized understanding and will let the patient know.

## 2021-08-15 NOTE — Telephone Encounter (Signed)
Patient spouse called and said that patient had x ray done and wants to know if Dr. Radford Pax can request and look at the xrays to make sure everything is ok. Please call back

## 2021-08-15 NOTE — Addendum Note (Signed)
Addended by: Antonieta Iba on: 08/15/2021 01:46 PM   Modules accepted: Orders

## 2021-09-09 ENCOUNTER — Ambulatory Visit: Payer: Medicare Other | Admitting: Cardiology

## 2021-09-09 ENCOUNTER — Encounter: Payer: Self-pay | Admitting: Cardiology

## 2021-09-09 ENCOUNTER — Other Ambulatory Visit: Payer: Self-pay

## 2021-09-09 VITALS — BP 138/80 | HR 77 | Ht 68.0 in | Wt 292.6 lb

## 2021-09-09 DIAGNOSIS — I420 Dilated cardiomyopathy: Secondary | ICD-10-CM | POA: Diagnosis not present

## 2021-09-09 DIAGNOSIS — I5032 Chronic diastolic (congestive) heart failure: Secondary | ICD-10-CM | POA: Diagnosis not present

## 2021-09-09 DIAGNOSIS — I2510 Atherosclerotic heart disease of native coronary artery without angina pectoris: Secondary | ICD-10-CM | POA: Diagnosis not present

## 2021-09-09 DIAGNOSIS — G4733 Obstructive sleep apnea (adult) (pediatric): Secondary | ICD-10-CM

## 2021-09-09 DIAGNOSIS — E785 Hyperlipidemia, unspecified: Secondary | ICD-10-CM

## 2021-09-09 NOTE — Patient Instructions (Signed)
Medication Instructions:  Your physician recommends that you continue on your current medications as directed. Please refer to the Current Medication list given to you today.  *If you need a refill on your cardiac medications before your next appointment, please call your pharmacy*   Lab Work: Come back fasting for a lipid panel and CMET  If you have labs (blood work) drawn today and your tests are completely normal, you will receive your results only by: [place] (if you have MyChart) OR A paper copy in the mail If you have any lab test that is abnormal or we need to change your treatment, we will call you to review the results.   Testing/Procedures: Your physician has requested that you have an echocardiogram. Echocardiography is a painless test that uses sound waves to create images of your heart. It provides your doctor with information about the size and shape of your heart and how well your heart's chambers and valves are working. This procedure takes approximately one hour. There are no restrictions for this procedure.  Follow-Up: At [facility], you and your health needs are our priority.  As part of our continuing mission to provide you with exceptional heart care, we have created designated Provider Care Teams.  These Care Teams include your primary Cardiologist (physician) and Advanced Practice Providers (APPs -  Physician Assistants and Nurse Practitioners) who all work together to provide you with the care you need, when you need it.  Your next appointment:   1 year(s)  The format for your next appointment:   In Person  Provider:   You may see Fransico Him, MD or one of the following Advanced Practice Providers on your designated Care Team:   Melina Copa, PA-C Ermalinda Barrios, PA-C

## 2021-09-09 NOTE — Progress Notes (Addendum)
Date:  09/23/2021   ID:  FUQUAN WILSON, DOB October 07, 1946, MRN [id number]   PCP:  [name], MD  Cardiologist: Fransico Him, MD Electrophysiologist:  None   Chief Complaint:  OSA, CAD, DCM  History of Present Illness:    Keith Bernard is a 75 y.o. 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 He has chronic LE edema from his neuropathy but this is stable and well controlled on diuretics.  He has moderate obstructive sleep apnea with an AHI of 17.7/h and now on auto BiPAP.  He is here today for followup and is doing well.  His wife made this appointment because he recently had a chest CT that showed aortic atherosclerosis as well as coronary disease and she was concerned that this was a new diagnosis for him and did not understand that he has a history of coronary disease and had an MI in the past.  CT was done for shortness of breath that was ordered by his pulmonologist.  He has chronic DOE due to asthma and is seeing pulmonary and showed interstitial lung disease.  High-resolution CT recently showed very subtle changes in the lungs mainly the right lower lobe possibly in dictating early or mild interstitial lung disease but stable.    He has not had any anginal symptoms since I saw him last.  He denies any PND, orthopnea, dizziness, palpitations or syncope. He has chronic LE edema which is stable and unchanged.  He is compliant with his meds and is tolerating meds with no SE.   He was supposed to get a 2D echocardiogram done at the last office visit because of shortness of breath and this was never done.    He is doing well with his CPAP device and thinks that he  has gotten used to it.  He tolerates the mask and feels the pressure is adequate.  He does not really feel rested in the am but has to get up to urinate about 3 times nightly.   He denies any significant mouth or nasal dryness or nasal congestion.  He does not think that he snores.     Prior CV studies:   The following studies were reviewed today:  Pap download for compliance, labs  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 EF 35% but now by echo ER 55% 04/2009   Depression 05/04/2016   DJD (degenerative joint disease)    Dyslipidemia    ETOH abuse    Foot ulcer ([place]) 07/07/2017   GERD (gastroesophageal reflux disease)    Hypertension    Neuropathy    Obesity    OSA (obstructive sleep apnea)    Moderate OSA w AHI 17.7/hr now on VPAP at EEP at 7cm H2O, min PS 3cm H2O,max PS 15cm H2O   RBBB    Past  Surgical History:  Procedure Laterality Date   CORONARY STENT PLACEMENT     REPLACEMENT TOTAL KNEE     left knee     Current Meds  Medication Sig   albuterol (PROVENTIL) (2.5 MG/3ML) 0.083% nebulizer solution Take 3 mLs (2.5 mg total) by nebulization every 6 (six) hours as needed for wheezing or shortness of breath.   albuterol (VENTOLIN HFA) 108 (90 Base) MCG/ACT inhaler Inhale 2 puffs into the lungs every 6 (six) hours as needed for wheezing or shortness of breath.   aspirin 81 MG tablet Take 81 mg by mouth daily.   atorvastatin (LIPITOR) 20 MG tablet TAKE 1 TABLET BY MOUTH  DAILY   Budeson-Glycopyrrol-Formoterol (BREZTRI AEROSPHERE) 160-9-4.8 MCG/ACT AERO Inhale 2 puffs into the lungs in the morning and at bedtime.   CALCIUM PO Take by mouth 2 (two) times daily.    Cholecalciferol  (VITAMIN D3) 1000 units CAPS Take 1,000 Units by mouth.   CO ENZYME Q-10 PO Take by mouth daily.    Docusate Sodium (DSS) 100 MG CAPS Take by mouth.   escitalopram (LEXAPRO) 20 MG tablet Take 20 mg by mouth daily.   esomeprazole (NEXIUM) 40 MG capsule Take 40 mg by mouth daily before breakfast.   fenofibrate 160 MG tablet TAKE 1 TABLET BY MOUTH  DAILY WITH FOOD   furosemide (LASIX) 20 MG tablet TAKE 1 TABLET BY MOUTH  EVERY OTHER DAY   LORazepam (ATIVAN) 1 MG tablet Take 1 mg by mouth every 8 (eight) hours.   Magnesium 250 MG TABS Take 1 tablet by mouth daily.   metoprolol tartrate (LOPRESSOR) 25 MG tablet TAKE 1 TABLET(25 MG) BY MOUTH TWICE DAILY   Misc Natural Products (COLON CLEANSE PO) Take by mouth.    Misc. Devices Capulin brace. He has a double upright brace that is no longer enough support for the arthritis and instability of his midfoot and ankle on his right.  It is in lieu of surgical correction.   montelukast (SINGULAIR) 10 MG tablet TAKE 1 TABLET(10 MG) BY MOUTH AT BEDTIME   nitroGLYCERIN (NITROSTAT) 0.4 MG SL tablet Place 1 tablet (0.4 mg total) under the tongue every 5 (five) minutes as needed.   Oxycodone HCl 10 MG TABS Take 10 mg by mouth every 6 (six) hours as needed.   Spacer/Aero-Holding Chambers (AEROCHAMBER MV) inhaler Use as instructed   tamsulosin (FLOMAX) 0.4 MG CAPS capsule Take 0.4 mg by mouth daily.    thiamine 100 MG tablet Take by mouth.   triamcinolone (NASACORT) 55 MCG/ACT AERO nasal inhaler 2 puffs in each nostril   vitamin B-12 (CYANOCOBALAMIN) 1000 MCG tablet Take 1,000 mcg by mouth daily.   zolpidem (AMBIEN) 10 MG tablet Take 10 mg by mouth at bedtime as needed for sleep.      Allergies:   Patient has no known allergies.   Social History   Tobacco Use   Smoking status: Never   Smokeless tobacco: Never  Vaping Use   Vaping Use: Never used  Substance Use Topics   Alcohol use: Yes    Alcohol/week: 0.0 standard drinks    Comment: Moderate, beer    Drug use: No     Family Hx: The patient's family history includes COPD in his mother; Other in his cousin; Pancreatic cancer in his father.  ROS:   Please see the history of present illness.     All other systems reviewed and are negative.   Labs/Other Tests and Data Reviewed:    Recent  Labs: 01/10/2021: ALT 9; BUN 11; Creatinine 0.91; Potassium 4.3; Sodium 137 09/19/2021: Hemoglobin 11.6; Platelets 214.0   Recent Lipid Panel Lab Results  Component Value Date/Time   CHOL 115 06/05/2020 01:45 PM   TRIG 134 06/05/2020 01:45 PM   HDL 50 06/05/2020 01:45 PM   CHOLHDL 2.3 06/05/2020 01:45 PM   CHOLHDL 1.9 05/04/2016 10:29 AM   LDLCALC 42 06/05/2020 01:45 PM   LDLDIRECT 33.0 05/07/2015 12:33 PM    Wt Readings from Last 3 Encounters:  09/19/21 285 lb (129.3 kg)  09/18/21 284 lb 9.6 oz (129.1 kg)  09/09/21 292 lb 9.6 oz (132.7 kg)     Objective:    Vital Signs:  BP 138/80   Pulse 77   Ht 5\' 8"  (1.727 m)   Wt 292 lb 9.6 oz (132.7 kg)   SpO2 94%   BMI 44.49 kg/m   GEN: Well nourished, well developed in no acute distress HEENT: Normal NECK: No JVD; No carotid bruits LYMPHATICS: No lymphadenopathy CARDIAC:RRR, no murmurs, rubs, gallops RESPIRATORY:  Clear to auscultation without rales, wheezing or rhonchi  ABDOMEN: Soft, non-tender, non-distended MUSCULOSKELETAL:  trace edema; No deformity  SKIN: Warm and dry NEUROLOGIC:  Alert and oriented x 3 PSYCHIATRIC:  Normal affect   ASSESSMENT & PLAN:    1.  ASCHD  -status post anterior MI with PCI with bare-metal stent of LAD on 04/12/2009.  Cath at that time also showed nonobstructive disease with 20% proximal RCA and 30% mid RCA.   -he has not had any further anginal sx but has continued to have SOB -I have recommended Lexiscan myoivew to rule out ischemia as an etiology of his SOB -continue Lopressor 25mg  BID, ASA and statin   2.  Hypertension  -BP is adequately controlled on exam today -Continue prescription drug  management with Lopressor 25 mg twice daily continue Lopressor 25mg  BID with as needed refills  3.  Dilated cardiomyopathy  -this is felt to be mixed ischemic/nonischemic.   -His most recent echo 10/19/2018 showed normal LV function with EF 55 to 60% with grade 2 diastolic dysfunction.   -continue BB  4.  Hyperlipidemia  -his LDL goal is less than 70.  -Check FLP and ALT.  His last LDL was a year ago at 31 -continue prescription drug management with atorvastatin 20 mg daily and fenofibrate 160 mg daily with as needed refills   5.  OSA - The patient is tolerating PAP therapy well without any problems.  The patient has been using and benefiting from PAP use and will continue to benefit from therapy.  -his last download was reviewed from April and showed on ly 10% compliance in using more than 4 hours nightly and AHI 5 on auto BiPAP -will repeat a download -encouraged improved compliance  6.  Chronic diastolic CHF -He has chronic shortness of breath related to interstitial lung disease followed by pulmonary.  He was supposed to have had a repeat 2D echocardiogram when I saw him last but this was never done.  I have recommended that we go ahead and get him set up to repeat an echo to make sure LV function remains stable.   -He has chronic RLE that is from his neuropathy -Assessment of volume status is difficult given his morbid obesity but he does not appear overtly volume overloaded -2D echocardiogram done 09/2018 showed normal LV function.   -continue prescription management with Lasix 20 mg every other day with as needed refills  -Check Cmet -See above  we will repeat 2D echocardiogram to make sure this is stable.  Medication Adjustments/Labs and Tests Ordered: Current medicines are reviewed at length with the patient today.  Concerns regarding medicines are outlined above.  Tests Ordered: Orders Placed This Encounter  Procedures   Comprehensive metabolic panel   Lipid panel    ECHOCARDIOGRAM COMPLETE    Medication Changes: No orders of the defined types were placed in this encounter.   Disposition:  Follow up in 6 month(s)  Signed, Fransico Him, MD  09/23/2021 5:20 PM    [facility]

## 2021-09-18 ENCOUNTER — Encounter: Payer: Self-pay | Admitting: Internal Medicine

## 2021-09-18 ENCOUNTER — Other Ambulatory Visit: Payer: Self-pay

## 2021-09-18 ENCOUNTER — Ambulatory Visit: Payer: Medicare Other | Admitting: Internal Medicine

## 2021-09-18 VITALS — BP 118/78 | HR 70 | Temp 98.2°F | Ht 68.0 in | Wt 284.6 lb

## 2021-09-18 DIAGNOSIS — R490 Dysphonia: Secondary | ICD-10-CM | POA: Diagnosis not present

## 2021-09-18 DIAGNOSIS — R0602 Shortness of breath: Secondary | ICD-10-CM | POA: Diagnosis not present

## 2021-09-18 DIAGNOSIS — J4540 Moderate persistent asthma, uncomplicated: Secondary | ICD-10-CM | POA: Diagnosis not present

## 2021-09-18 DIAGNOSIS — G4733 Obstructive sleep apnea (adult) (pediatric): Secondary | ICD-10-CM

## 2021-09-18 DIAGNOSIS — Z9989 Dependence on other enabling machines and devices: Secondary | ICD-10-CM

## 2021-09-18 NOTE — Patient Instructions (Addendum)
Moderate persistent asthma without complication with elevated IgE and blood eps    - clinically well controlled v progression/persistence - having side effect of hoarseness from [place] - having progressive worsening shortness of breath- ? Due to fitness/weight issues v asthma without inhaler tratement  Plan  - mark breztri in [place]ery[place] - take wixela 2 puff twice daily and spiriva handihaler  2 ppuff once daily - the samples from yoour deceased family member  - check cbc with diff and blood IgE again  - if abnormal consider biologic (fasenral first choice) time limited trial - -Use albuterol 2 puffs as needed                     -Take new prescription.  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  - still persistent aug 2022 - similar to  1 year ago  Plan -to sort out type of problem - biopsy needed but given health issues do not recommend biopsy  - recommend serial monitorng approach]  - check ana, ds-dna, RF, CCP and Quantiferon Gold   Hoarseness of voice   - made worse recently by [employer] of breath   - possibly asthma but weight and stiff heart muscle  Plan  - get repeat echo  Follow-up -4 weeeks with APP to discuss next steps

## 2021-09-18 NOTE — Progress Notes (Signed)
OV 01/29/2016  Chief Complaint  Patient presents with   Follow-up    Pt here after acute visit with SG on 2.10.17. Pt states his breathing has imrpoved since last OV. Pt states he has occassional wheezing and mild non prod cough. Pt denies CP/tightness.     Keith Bernard returns for chronic obstructive lung disease asthma follow-up. He is now on Dulera plus Singulair. He is compliant with Dulera. He says with improved compliance his symptoms are a lot better. His wife is here with him. Most recently he had a flareup and was treated with prednisone. I review the chart when he was seen by Keith Bernard gross my nurse practitioner. Currently he feels asthma is well controlled. However he still uses albuterol multiple times a day for rescue but denies any nocturnal symptoms or daytime symptoms. He is compliant with this CPAP for sleep apnea.  Review of the chart shows this is in December 2012 has elevated eos 400 cells per cubic millimeter. I notice that and IgE was never checked. In any event subjectively is well controlled currently. Also noted on alpha-1 has not been checked.  Asthma control questionnaire shows that at night he hardly ever wakes up because of asthma. When he wakes up he has very mild symptoms. In the daytime he is moderately limited because of asthma although this could be because of obesity. He also has moderate shortness of breath because of asthma although this could be because of obesity. He is wheezing a lot of the time and uses albuterol 3-4 puffs on most days. Nevertheless average score of 2.2 show significant improvement after starting [place] 05/04/2016  Chief Complaint  Patient presents with   Follow-up    Pt c/o occasional wheeze since not being on Breo for about a week. Pt has been using albuterol HFA more. Pt denies cough/SOB/CP/tightness.    Follow-up asthma. Last seen March 2017. This is a routine follow-up. I put him on Breo when she really liked. However the  cost dollars out of pocket because he is in the donut hole. So he ran out of it over a week ago and is having increased symptoms. Asthma control questionnaire is 2.0 and still within his range from prior visits. Exhaled nitric oxide is just over 25 suggests some amount of active airway inflammation. His wife is here with him and she says that she got approved for the [facility] patient assistance program but apparently between our office in the mailroom the paperwork has been loss. She is willing to redo the paperwork and she is wondering if I could sign my part of it this week.  In terms of symptoms specifically tells me that he does wake up a few times at night. When he wakes up he has very mild symptoms. Slightly limited in his activities because of asthma. He has moderate amount of shortness of breath with exertion. He does wheeze a little of the time and is using albuterol for rescue at least one or 2 times daily. 5 point score is 2/.0 showing activity - is worse this past week or so is within normal   OV 01/04/2017   Chief Complaint  Patient presents with   Follow-up    Pt states last week he had an 'asthma attack' and went to ED in [place] and was given steroid and abx per pt's wife. Pt states he feels he is improving. Pt states he does not feel back to baseline. Pt denies CP/tightness  and f/c/s.     Follow-up moderate persistent asthma  Last seen June 2017. He presents with his wife as always. He is on Dulera through the assistance program. Some 3 weeks ago his sister died from terminal cancer. Take a flight to [place]. His been dealing with those emotions. Because of the travel, emotions and weather change some 10 days ago he had an asthma exacerbation but never below few days with increased cough and wheezing. He went to a local emergency department and got one shot of steroids and was given 7 days of cephalexin which is completed. Despite this is not fully better. He still has some cough  and wheeze and yellow sputum. There is no fever.   OV 07/07/2017  Chief Complaint  Patient presents with   Follow-up    Pt using Dulera, still having trouble getting this covered by insurance and getting Financial Assistance/Patient Assistance. Pt states that his breathing is doing better after senc round of Abx/Prednisone.      Follow-up moderate persistent asthma   Last seen February 2018. He presents with his wife. Overall he is doing stable except with the summer heat his symptoms are slightly more than usual as seen in the asthma control questionnaire. He says that he does not wake up at night because of asthma. When he wakes up he has mild symptoms. He has limitation in his activities because of asthma but this could also be because of obesity and DJD and spine issues. He does get dyspneic for moderate amount of the time and is wheezing a lot of the time but using albuterol for rescue 1-2 puffs per day. He's having significant difficulty getting [employer] to pay for his assistance program and is in need of samples today. He continues on Singulair.   OV 12/09/2017  Chief Complaint  Patient presents with   Follow-up    shortness of breath   Follow-up moderate persistent asthma  Routine 9-month follow-up.  He presents with his wife.  Overall he is doing fine.  He has significant symptoms on his asthma control questionnaire.  In fact his symptoms are slowly deteriorated.  Average score is 2.8.  But this resolved mostly shortness of breath.  He feels is due to obesity.  His nitric oxide is 12 showing good asthma control on Dulera and Singulair.  He plans to lose weight.  Recently had a fall and hurt the shoulder.  Review of the images show is not had a chest x-ray since 2010.  He is up-to-date with his flu shot.  In terms of his asthma control questionnaire he hardly ever wakes up at night.  When he wakes up he has moderate symptoms.  He is limited with his activities moderately and does express  a moderate amount of shortness of breath but he does wheeze occasionally.  He is using albuterol for rescue multiple times a day.  =  OV 08/05/2018  Subjective:  Patient ID: Keith Bernard, Keith Bernard , DOB: 1946-09-24 , age 38 y.o. , MRN: [id number] , ADDRESS: [place] [id number]   08/05/2018 -   Chief Complaint  Patient presents with   Follow-up    Pt has been sick and was put on prednisone and abx which he began Monday, 9/2.  Pt has c/o cough with green mucus, increased SOB. Denies any CP or chest tightness.     HPI Keith Bernard 75 y.o. -moderate persistent asthma patient here for follow-up. He is here with  his wife. They tell me that has been many months since he has taken his inhale corticosteroid Dulera because the patient assistance program has been slow to respond.He is relying on sampples. Apparently got some Symbicort from usat some point in the past since he last saw me. However he has run out of that as well. He is not sure how long it is since he ran out of that. So he is having a flareup. He is just dependent on prednisone for the last 5 days with antibiotic symptoms by our nurse practitioner.He does not feel fully back to baseline. His asthma control question is 3.2 showing significant nocturnal symptoms or albuterol rescue use OF breath and wheezing. In fact on exam he had some wheezing is exhaled nitric oxide was 28 while on prednisone       OV 11/11/2018  Subjective:  Patient ID: Keith Bernard, Keith Bernard , DOB: May 06, 1946 , age 24 y.o. , MRN: [id number] , ADDRESS: [place] [id number]   11/11/2018 -   Chief Complaint  Patient presents with   Follow-up    f/u asthma. Pt states he has had a recent flare up, wheezing has increased.   Moderate persistent asthma for follow-up.  He is on [place] and Singulair  HPI Keith Bernard 75 y.o. -for the last 3 days he is reporting increased cough with congestion and some mild increase  in wheezing with green sputum.  Asthma control questionnaire shows that he is waking up a few times at night.  When he wakes up he is quite severe symptoms he is very limited in his activities because of asthma experiencing a very great deal of shortness of breath and wheezing all the time using albuterol for rescue 5 times daily.  However nitric oxide test is normal.  Clinically is not wheezing actively.  Recently had edema and my nurse practitioner started him on Lasix.  He tells me there are a bunch of cats at home and also significant mold exposure.  The wife is interested in allergy testing but they do not have the financial resources to get rid of the cats or get rid of the mold to sell the house.  They are frustrated by all this.  He says today he will be content taking antibiotic and if it gets worse doing prednisone.  Last chest x-ray October 2019.  He is never had CT chest.       OV 09/19/2019  Subjective:  Patient ID: Keith Bernard, Keith Bernard , DOB: [birth year] , age 69 y.o. , MRN: [id number] , ADDRESS: [place] [id number]   09/19/2019 -   follow-up obstructive lung disease.  To person identifier used to identify the patient on this telephone visit.  Risks, benefits and limitations explained.  Moderate persistent asthma for follow-up.  He is on Dulera and Singulair History of exposure to the mold   HPI Keith Bernard 75 y.o. -presents for this telephone visit.  He says he is doing well.  Socially distancing and trying to avoid COVID-19.  The main purpose of the call is that he wants a refill on the Singulair and was told to make a telephone visit.  He says his symptoms are really well controlled.  Last year he got rid of the mold in the house.  He save money and got this done.  He still has some kittens with is not bothering him.  He says her asthma is really  much better after getting rid of the mold.  He continues on Dulera and Singulair.  Is up-to-date with his  flu shot.  He has some baseline shortness of breath and neuropathy.  He is compliant with his CPAP for sleep apnea.  There are no other new problems     ROS - per HPI     OV 05/07/2020  Subjective:  Patient ID: Keith Bernard, Keith Bernard , DOB: Jan 04, 1946 , age 36 y.o. , MRN: [id number] , ADDRESS: [place] [id number]   05/07/2020 -   Chief Complaint  Patient presents with   Follow-up    Pt states he has been doing okay since last visit. Pt states that he does become SOB due to being overweight.   Moderate persistent asthma for follow-up.  He is on Dulera and Singulair History of exposure to the mold - cleaned house spring 2021   HPI Keith Bernard 75 y.o. -returns for follow-up.  He presents with his wife.  Wife is giving most of the history and is having to redirect the husband.  As best as I can gather it appears the asthma is well controlled.  However the ACT score is 14 suggesting poor control.  In talking to him I find out that he is using his albuterol multiple times a day.  He thinks this is a habit but also is using it because of sore throat.  He also states that he is also using it for shortness of breath.  He is not having any nocturnal symptoms of wheezing.  He just uses albuterol randomly at the daytime.  However wife and he overall feels asthma is well controlled with Dulera and Singulair.  They believe the Ruthe Mannan is a life saver.  Asking for charity support of filling a form for [facility].  In terms of his mold exposure he has gotten rid of the mold.  He feels masking and social distancing has helped his respiratory quality of life.   OV 11/13/2020   Subjective:  Patient ID: Keith Bernard, Keith Bernard , DOB: [birth year], age 35 y.o. years. , MRN: [id number],  ADDRESS: [place] [id number] PCP  [name], MD Providers : Treatment Team:  Attending Provider: Brand Males, MD Patient Care Team: [name], MD as PCP -  General (Family Medicine) [name], MD as Consulting Physician (Cardiology)    Chief Complaint  Patient presents with   Follow-up    Patient is hoarse all the time and they are concerned about it. Patient states he would like to stay on Breztri. Both feet seem to be swollen, patient states that he is tired all the time and sleeps a lot during the day.     Moderate persistent asthma for follow-up.  He is on Dulera and Singulair> changed toBREZTRI summer/fall 2021 History of exposure to the mold - cleaned house spring 2021 Elevated IgE level June 2021 with eosinophils 200 cells per cubic millimeter June 2021  OSA on CPAP Morbid obesity uses a cane Early interstitial lung abnormalities on September 2021 high-resolution CT chest Elevation of left hemidiaphragm noted on September 2021 CT chest   HPI Keith Bernard 75 y.o. -returns for follow-up with his wife.  Overall he feels stable.  His biggest issue is that over the last few to several months he has had worsening of his chronic intermittent hoarseness of voice.  It is now persistent.  He states one of his family  relatives had vocal cord nodules.  Therefore he is concerned.  He feels his asthma is well controlled with his current inhaler regimen.  He is now on triple inhaler therapy.  He says it took a while for him to get used to it but he says he likes it now.  In terms of his sleep apnea is continuing on CPAP.  After last visit I checked a blood IgE and eosinophils.  His IgE slightly elevated.  His high-resolution CT chest shows evidence of early interstitial lung abnormalities but no clear-cut evidence of ILD/pulmonary fibrosis in my opinion.  Despite feeling his asthma is under control he complains of shortness of breath.  This is probably related to his obesity.  He gets dyspneic when he bends down or when he walks.  He wants to see a nutritionist or undergo some kind of a program where he could lose weight.  Of note: They  wanted me to help filling out the form for charity application for his inhaler therapy from [place].  The program is called [place] and me  Asthma Control Test ACT Total Score  05/07/2020 14    Lab Results  Component Value Date   NITRICOXIDE 11 11/11/2018     CT chest HRCT Sept 2021  IMPRESSION: 1. 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 2. Faint patchy ground-glass opacity in the peripheral perilobular right lung base, nonspecific, with the differential including nonspecific minimal postinfectious/postinflammatory scarring versus very mild interstitial lung disease such as NSIP. Consider follow-up high-resolution chest CT study in 12 months. Findings are suggestive of an alternative diagnosis (not UIP) per consensus guidelines: Diagnosis of Idiopathic Pulmonary Fibrosis: An Official ATS/ERS/JRS/ALAT Clinical Practice Guideline. [place], Iss 5, [phone], Jul 31 2017. 3. Two-vessel coronary atherosclerosis. 4. Aortic Atherosclerosis (ICD10-I70.0).     Electronically Signed   By: Ilona Sorrel M.D.   On: 08/12/2020 16:03    OV 07/16/2021  Subjective:  Patient ID: Keith Bernard, Keith Bernard , DOB: 01/22/1946 , age 75 y.o. , MRN: [id number] , ADDRESS: [place] [address] PCP [name], MD Patient Care Team: [name], MD as PCP - General (Family Medicine) [name], MD as Consulting Physician (Cardiology)  This Provider for this visit: Treatment Team:  Attending Provider: Brand Males, MD    07/16/2021 -   Chief Complaint  Patient presents with   Follow-up    Pt states throat hurting because of brezatri inhaler, pt states tongue hurting also has hoarseness.    Moderate persistent asthma for follow-up.  He is on Dulera and Singulair> changed toBREZTRI summer/fall 2021 History of exposure to the mold - cleaned house  spring 2021 Elevated IgE level June 2021 with eosinophils 200 cells per cubic millimeter June 2021  OSA on CPAP Morbid obesity uses a cane Early interstitial lung abnormalities on September 2021 high-resolution CT chest Elevation of left hemidiaphragm noted on September 2021 CT chest  HPI Keith Bernard 75 y.o. -he is now on BREZTRI since last year.  This is because of co-pay issues.  He is on a [employer] fast streamer.  He is not able to do Symbicort because it is no longer available on the free program.  It is generic.  He is not even able to afford albuterol he uses his family's albuterol.  The  BREZTRI is controlling his asthma well but he has significant hoarseness  of voice.  In fact compared to last him the hoarseness is gotten worse.  Wife tells me that he is extremely worried about vocal cord cancer.  He has seen to ear nose throat doctors.  Including Dr. Constance Holster was done cultures on him.  Apparently has been reassured.  He also feels reassured there is no vocal cord cancer.  He believes the inhaler is causing the hoarseness of voice.  But at this point in time he wants to just soldier on with current inhaler because of the cost issues.  Of note a year ago his high-resolution CT chest showed some interstitial abnormalities.  His 1 year CT scan follow-up is due September 2022.  We will postpone this.  This because he is feeling stable from a breathing standpoint shortness of breath standpoint.   Asthma Control Test ACT Total Score  07/16/2021 10  05/07/2020 14     Lab Results  Component Value Date   NITRICOXIDE 11 11/11/2018           OV 09/18/2021  Subjective:  Patient ID: Keith Bernard, Keith Bernard , DOB: [birth year] , age 33 y.o. , MRN: [id number] , ADDRESS: [place] [id number] PCP [name], MD Patient Care Team: [name], MD as PCP - General (Family Medicine) [name], MD as Consulting Physician (Cardiology)  This Provider  for this visit: Treatment Team:  Attending Provider: Brand Males, MD    09/18/2021 -   Chief Complaint  Patient presents with   Follow-up    Pt is here to discuss the results of recent CT.  Pt is more SOB compared to last visit.    Moderate persistent asthma for follow-up.  He is on Dulera and Singulair> changed toBREZTRI summer/fall 2021 History of exposure to the mold - cleaned house spring 2021 Elevated IgE level June 2021 with eosinophils 200 cells per cubic millimeter June 2021  OSA on CPAP Morbid obesity uses a cane Early interstitial lung abnormalities on September 2021 high-resolution CT chest -> no change Aug 2022 Elevation of left hemidiaphragm noted on September 2021 CT chest Grade 2 diastolic dysfunction on echocardiogram November 2019  HPI Keith Bernard 75 y.o. -last seen in August 2022.  He had tolerated Symbicort well in the past but he could not afford it.  He was I think initially able to get BREZTRI through charity program from the pharmaceutical but then it was causing hoarseness of voice.  He then resorted to just taking it as needed.  He is not taking it anymore.  He says he is not eligible for the charity.  He has a deceased family member who had Spiriva and Wixela.  He is wondering whether to take it although he is reluctant to take Spiriva because of the dry powder inhaler and cause hoarseness.  But then his wife states he is getting worsening shortness of breath.  We walked him and he walks slowly and Talking although he had some mild shortness of breath.  He did show some slight tendency to desaturate.  But overall did well.  Nevertheless the wife thinks that he is short of breath for minimal activities and it is worse.  He did have a repeat high-resolution CT chest that still shows the early interstitial lung abnormalities.  It is unchanged.  We briefly discussed the concept of Biologics again.  They are interested.    Asthma Control Panel -December  2020 -  eos 400 cells per cubic millimeter \->  June 2021 200 cells - IgE 235 elevated June 2021 - obstn with low dlco nov 2016 on PFT - normal IgG, IgM and IgA  -feb 2021   - Alpha 1 is MM 08/23/2015  01/29/2016  05/04/2016  07/07/2017  12/09/2017  08/05/2018  11/11/2018  05/07/2020  07/16/2021   Current Med Regimen symbicort  Dulera  Off breo for several days.  On dulera 100. Struggling to afford and singulair dulera and singulair dulera and singulair dulra and singulair    ACT - a [employer] test - 4 week. Total score. Max is 25, Lower score is worse.  <19 = poor control        14 10  ACQ 5 point- 1 week. wtd avg score. <1.0 is good control 0.75-1.25 is grey zone. >1.25 poor control. Delta 0.5 is clinically meaningful 3.2 2.2 2.0 2.6 2.8 3.2 4.2    FeNO ppB 34 x 26  13 28  on prednisone 11    FeV1  x  x        Planned intervention  for visit Start dulera + singulair Cont dulera      Con breztril sept 2021    Simple office walk 185 feet x  3 laps goal with forehead probe 09/18/2021    O2 used ra   Number laps completed 2 of 3.   Comments about pace Slow pace, with cane, kept talking   Resting Pulse Ox/HR 98% and 68/Bernard   Final Pulse Ox/HR 92% and 63/Bernard   Desaturated </= 88% no   Desaturated <= 3% points Tesm 6 ooints   Got Tachycardic >/= 90/Bernard no   Symptoms at end of test None to mild   Miscellaneous comments x      Asthma Control Test ACT Total Score  07/16/2021 10  05/07/2020 14     Lab Results  Component Value Date   NITRICOXIDE 11 11/11/2018      CT Chest data HRCT 07/30/21  Narrative & Impression  CLINICAL DATA:  75 year old Keith Bernard with history of shortness of breath.   EXAM: CT CHEST WITHOUT CONTRAST   TECHNIQUE: Multidetector CT imaging of the chest was performed following the standard protocol without intravenous contrast. High resolution imaging of the lungs, as well as inspiratory and expiratory imaging, was performed.   COMPARISON:  Chest CT 08/12/2020.    FINDINGS: Cardiovascular: Heart size is normal. There is no significant pericardial fluid, thickening or pericardial calcification. There is aortic atherosclerosis, as well as atherosclerosis of the great vessels of the mediastinum and the coronary arteries, including calcified atherosclerotic plaque in the left main, left anterior descending and right coronary arteries.   Mediastinum/Nodes: No pathologically enlarged mediastinal or hilar lymph nodes. Please note that accurate exclusion of hilar adenopathy is limited on noncontrast CT scans. Esophagus is unremarkable in appearance. No axillary lymphadenopathy.   Lungs/Pleura: High-resolution images demonstrates some very mild ground-glass attenuation and septal thickening, most evident in the periphery of the right lower lobe. No parenchymal banding, traction bronchiectasis or frank honeycombing. Inspiratory and expiratory imaging demonstrates some mild air trapping indicative of small airways disease. Chronic elevation of the left hemidiaphragm again noted. No acute consolidative airspace disease. No pleural effusions. No suspicious appearing pulmonary nodules or masses are noted.   Upper Abdomen: Aortic atherosclerosis. Status post cholecystectomy. Splenule incidentally noted adjacent to the medial aspect of the upper spleen.   Musculoskeletal: There are no aggressive appearing lytic or blastic lesions noted in the visualized portions of the skeleton.   IMPRESSION:  1. There are very subtle changes in the lungs, most evident in the right lower lobe, which could be indicative of early or mild interstitial lung disease. This is stable compared to the prior study, and categorized as indeterminate for usual interstitial pneumonia (UIP) per current ATS guidelines. 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 2. Aortic atherosclerosis, in addition to left main and  2 vessel coronary artery disease. Assessment for potential risk factor modification, dietary therapy or pharmacologic therapy may be warranted, if clinically indicated.   Aortic Atherosclerosis (ICD10-I70.0).     Electronically Signed   By: Vinnie Langton M.D.   On: 07/31/2021 21:25    No results found.    PFT  PFT Results Latest Ref Rng & Units 06/19/2020 10/17/2015  FVC-Pre L 2.44 2.22  FVC-Predicted Pre % 62 54  FVC-Post L 2.58 2.34  FVC-Predicted Post % 65 57  Pre FEV1/FVC % % 75 75  Post FEV1/FCV % % 79 80  FEV1-Pre L 1.85 1.66  FEV1-Predicted Pre % 64 55  FEV1-Post L 2.04 1.88  DLCO uncorrected ml/Bernard/mmHg 18.83 19.62  DLCO UNC% % 79 66  DLCO corrected ml/Bernard/mmHg 20.59 -  DLCO COR %Predicted % 86 -  DLVA Predicted % 123 121  TLC L 4.78 4.62  TLC % Predicted % 71 69  RV % Predicted % 101 98       has a past medical history of Asthma, Chronic diastolic CHF (congestive heart failure) (HCC), Chronic kidney disease (CKD), stage III (moderate) (HCC), Chronic pain, Coronary artery disease, Depression (05/04/2016), DJD (degenerative joint disease), Dyslipidemia, ETOH abuse, Foot ulcer ([place]) (07/07/2017), GERD (gastroesophageal reflux disease), Hypertension, Neuropathy, Obesity, OSA (obstructive sleep apnea), and RBBB.   reports that he has never smoked. He has never used smokeless tobacco.  Past Surgical History:  Procedure Laterality Date   CORONARY STENT PLACEMENT     REPLACEMENT TOTAL KNEE     left knee    No Known Allergies  Immunization History  Administered Date(s) Administered   DTaP 07/22/2001   Fluad Quad(high Dose 65+) 11/04/2016, 08/26/2020   Influenza Split 09/30/2014   Influenza, High Dose Seasonal PF 11/04/2016, 08/17/2017, 11/03/2018, 09/08/2019   Influenza, Seasonal, Injecte, Preservative Fre 08/30/2017   Influenza,inj,Quad PF,6+ Mos 10/23/2015   Influenza-Unspecified 08/30/2017   Moderna Sars-Covid-2 Vaccination 02/08/2020, 03/09/2020,  11/14/2020   Pneumococcal Conjugate-13 01/02/2015, 10/01/2015   Pneumococcal Polysaccharide-23 04/11/2012   Pneumococcal-Unspecified 04/11/2012   Tdap 12/22/2016   Zoster Recombinat (Shingrix) 07/21/2017, 01/20/2018   Zoster, Live 01/02/2015    Family History  Problem Relation Age of Onset   COPD Mother    Pancreatic cancer Father        pancreatic    Other Cousin        Amyloidosis     Current Outpatient Medications:    albuterol (PROVENTIL) (2.5 MG/3ML) 0.083% nebulizer solution, Take 3 mLs (2.5 mg total) by nebulization every 6 (six) hours as needed for wheezing or shortness of breath., Disp: 75 mL, Rfl: 5   albuterol (VENTOLIN HFA) 108 (90 Base) MCG/ACT inhaler, Inhale 2 puffs into the lungs every 6 (six) hours as needed for wheezing or shortness of breath., Disp: 18 g, Rfl: 6   aspirin 81 MG tablet, Take 81 mg by mouth daily., Disp: , Rfl:    atorvastatin (LIPITOR) 20 MG tablet, TAKE 1 TABLET BY MOUTH  DAILY, Disp: 90 tablet, Rfl: 3   Budeson-Glycopyrrol-Formoterol (BREZTRI AEROSPHERE) 160-9-4.8 MCG/ACT AERO, Inhale  2 puffs into the lungs in the morning and at bedtime., Disp: 11.8 g, Rfl: 0   CALCIUM PO, Take by mouth 2 (two) times daily. , Disp: , Rfl:    Cholecalciferol (VITAMIN D3) 1000 units CAPS, Take 1,000 Units by mouth., Disp: , Rfl:    CO ENZYME Q-10 PO, Take by mouth daily. , Disp: , Rfl:    Docusate Sodium (DSS) 100 MG CAPS, Take by mouth., Disp: , Rfl:    escitalopram (LEXAPRO) 20 MG tablet, Take 20 mg by mouth daily., Disp: , Rfl:    esomeprazole (NEXIUM) 40 MG capsule, Take 40 mg by mouth daily before breakfast., Disp: , Rfl:    fenofibrate 160 MG tablet, TAKE 1 TABLET BY MOUTH  DAILY WITH FOOD, Disp: 90 tablet, Rfl: 3   furosemide (LASIX) 20 MG tablet, TAKE 1 TABLET BY MOUTH  EVERY OTHER DAY, Disp: 45 tablet, Rfl: 3   LORazepam (ATIVAN) 1 MG tablet, Take 1 mg by mouth every 8 (eight) hours., Disp: , Rfl:    Magnesium 250 MG TABS, Take 1 tablet by mouth daily.,  Disp: , Rfl:    metoprolol tartrate (LOPRESSOR) 25 MG tablet, TAKE 1 TABLET(25 MG) BY MOUTH TWICE DAILY, Disp: 180 tablet, Rfl: 2   Misc Natural Products (COLON CLEANSE PO), Take by mouth. , Disp: , Rfl:    Misc. Devices MISC, [place] brace. He has a double upright brace that is no longer enough support for the arthritis and instability of his midfoot and ankle on his right.  It is in lieu of surgical correction., Disp: , Rfl:    montelukast (SINGULAIR) 10 MG tablet, TAKE 1 TABLET(10 MG) BY MOUTH AT BEDTIME, Disp: 90 tablet, Rfl: 3   nitroGLYCERIN (NITROSTAT) 0.4 MG SL tablet, Place 1 tablet (0.4 mg total) under the tongue every 5 (five) minutes as needed., Disp: 25 tablet, Rfl: 3   Oxycodone HCl 10 MG TABS, Take 10 mg by mouth every 6 (six) hours as needed., Disp: , Rfl:    Spacer/Aero-Holding Chambers (AEROCHAMBER MV) inhaler, Use as instructed, Disp: 1 each, Rfl: 0   tamsulosin (FLOMAX) 0.4 MG CAPS capsule, Take 0.4 mg by mouth daily. , Disp: , Rfl:    thiamine 100 MG tablet, Take by mouth., Disp: , Rfl:    triamcinolone (NASACORT) 55 MCG/ACT AERO nasal inhaler, 2 puffs in each nostril, Disp: , Rfl:    vitamin B-12 (CYANOCOBALAMIN) 1000 MCG tablet, Take 1,000 mcg by mouth daily., Disp: , Rfl:    zolpidem (AMBIEN) 10 MG tablet, Take 10 mg by mouth at bedtime as needed for sleep. , Disp: , Rfl:       Objective:   Vitals:   09/18/21 1545  BP: 118/78  Pulse: 70  Temp: 98.2 F (36.8 C)  TempSrc: Oral  SpO2: 95%  Weight: 284 lb 9.6 oz (129.1 kg)  Height: 5\' 8"  (1.727 m)    Estimated body mass index is 43.27 kg/m as calculated from the following:   Height as of this encounter: 5\' 8"  (1.727 m).   Weight as of this encounter: 284 lb 9.6 oz (129.1 kg).  @WEIGHTCHANGE @  [employer]   09/18/21 1545  Weight: 284 lb 9.6 oz (129.1 kg)     Physical Exam General: No distress. OBESE Neuro: Alert and Oriented x 3. GCS 15. Speech normal Psych: Pleasant Resp:  Barrel Chest - no.  Wheeze  - no, Crackles - possible crackles at base, No overt respiratory distress CVS: Normal heart sounds. Murmurs -  no Ext: Stigmata of Connective Tissue Disease - no. [place] WITH CANE SLOW. RT KNEE BRACE HEENT: Normal upper airway. PEERL +. No post nasal drip        Assessment:       ICD-10-CM   1. Moderate persistent asthma without complication  K81.27     2. Hoarseness of voice  R49.0     3. OSA on CPAP  G47.33    Z99.89     4. Shortness of breath  R06.02          Plan:     Patient Instructions  Moderate persistent asthma without complication with elevated IgE and blood eps    - clinically well controlled v progression/persistence - having side effect of hoarseness from BREZTI - having progressive worsening shortness of breath- ? Due to fitness/weight issues v asthma without inhaler tratement  Plan  - mark breztri in alleryg - take wixela 2 puff twice daily and spiriva handihaler  2 ppuff once daily - the samples from yoour deceased family member  - check cbc with diff and blood IgE again  - if abnormal consider biologic (fasenral first choice) time limited trial - -Use albuterol 2 puffs as needed                     -Take new prescription.  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  - still persistent aug 2022 - similar to  1 year ago  Plan -to sort out type of problem - biopsy needed but given health issues do not recommend biopsy  - recommend serial monitorng approach]  - check ana, ds-dna, RF, CCP and Quantiferon Gold   Hoarseness of voice   - made worse recently by [employer] of breath   - possibly asthma but weight and stiff heart muscle  Plan  - get repeat echo  Follow-up -4 weeeks with APP to discuss next steps  ( Level 05 visit: Estb 40-54 Bernard   in  visit type: on-site  physical face to visit  in total care time and counseling or/and coordination of care by this undersigned MD - Dr Brand Males. This includes one or more of the following on this same day 09/18/2021: pre-charting, chart review, note writing, documentation discussion of test results, diagnostic or treatment recommendations, prognosis, risks and benefits of management options, instructions, education, compliance or risk-factor reduction. It excludes time spent by the [facility] or office staff in the care of the patient. Actual time 40 Bernard)   SIGNATURE    Dr. Brand Males, M.D., F.C.C.P,  Pulmonary and Critical Care Medicine Staff Physician, [place] Director - Interstitial Lung Disease  Program  [facility] [place] [facility] [place], [place], [address]  Pager: [phone], If no answer or between  15:00h - 7:00h: call [phone] Telephone: [phone]  4:32 PM 09/18/2021

## 2021-09-19 ENCOUNTER — Ambulatory Visit: Payer: Medicare Other | Admitting: Endocrinology

## 2021-09-19 ENCOUNTER — Other Ambulatory Visit (INDEPENDENT_AMBULATORY_CARE_PROVIDER_SITE_OTHER): Payer: Medicare Other

## 2021-09-19 DIAGNOSIS — R7989 Other specified abnormal findings of blood chemistry: Secondary | ICD-10-CM | POA: Insufficient documentation

## 2021-09-19 DIAGNOSIS — J4540 Moderate persistent asthma, uncomplicated: Secondary | ICD-10-CM | POA: Diagnosis not present

## 2021-09-19 LAB — CBC WITH DIFFERENTIAL/PLATELET
Basophils Absolute: 0.1 10*3/uL (ref 0.0–0.1)
Basophils Relative: 0.7 % (ref 0.0–3.0)
Eosinophils Absolute: 0.1 10*3/uL (ref 0.0–0.7)
Eosinophils Relative: 0.9 % (ref 0.0–5.0)
HCT: 35.7 % — ABNORMAL LOW (ref 39.0–52.0)
Hemoglobin: 11.6 g/dL — ABNORMAL LOW (ref 13.0–17.0)
Lymphocytes Relative: 14.9 % (ref 12.0–46.0)
Lymphs Abs: 1.2 10*3/uL (ref 0.7–4.0)
MCHC: 32.5 g/dL (ref 30.0–36.0)
MCV: 94.8 fl (ref 78.0–100.0)
Monocytes Absolute: 0.6 10*3/uL (ref 0.1–1.0)
Monocytes Relative: 7.8 % (ref 3.0–12.0)
Neutro Abs: 5.9 10*3/uL (ref 1.4–7.7)
Neutrophils Relative %: 75.7 % (ref 43.0–77.0)
Platelets: 214 10*3/uL (ref 150.0–400.0)
RBC: 3.77 Mil/uL — ABNORMAL LOW (ref 4.22–5.81)
RDW: 14.5 % (ref 11.5–15.5)
WBC: 7.8 10*3/uL (ref 4.0–10.5)

## 2021-09-19 LAB — FOLLICLE STIMULATING HORMONE: FSH: 6 m[IU]/mL (ref 1.4–18.1)

## 2021-09-19 LAB — LUTEINIZING HORMONE: LH: 2.01 m[IU]/mL — ABNORMAL LOW (ref 3.10–34.60)

## 2021-09-19 NOTE — Patient Instructions (Signed)
Blood tests are requested for you today.  We'll let you know about the results.  Based on the results, we'll probably need to start 1 pump of androgel per day.

## 2021-09-19 NOTE — Progress Notes (Signed)
Subjective:    Patient ID: Keith Bernard, male    DOB: 1946-10-25, 75 y.o.   MRN: [id number]  HPI Wife provides some hx, due to pt's memory loss.  Pt is referred by Dr Lovena Neighbours, for low testosterone level.  Pt reports he had puberty at the normal age.  He has 2 biological children.  He says he has never taken illicit androgens.  He denies any h/o infertility, XRT, or genital infection.  He has never had surgery, or a serious injury to the head or genital area. He has no h/o DVT.   He does not consume alcohol excessively.  No exposure to agent orange.  He took androgel 2017-2017.  On this, he felt much better, but it was stopped due to high level.  He then took clomid x 6 mos in 2021-2022.  Pt says this did not help testosterone level.  He has ED, doe, and weight gain.   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 EF 35% but now by echo ER 55% 04/2009   Depression 05/04/2016   DJD (degenerative joint disease)    Dyslipidemia    ETOH abuse    Foot ulcer ([place]) 07/07/2017   GERD (gastroesophageal reflux disease)    Hypertension    Neuropathy    Obesity    OSA (obstructive sleep apnea)    Moderate OSA w AHI 17.7/hr now on VPAP at EEP at 7cm H2O, min PS 3cm H2O,max PS 15cm H2O   RBBB     Past Surgical History:  Procedure Laterality Date   CORONARY STENT PLACEMENT     REPLACEMENT TOTAL KNEE     left knee    Social History   Socioeconomic History   Marital status: Married    Spouse name: Not on file   Number of children: Not on file   Years of education: Not on file   Highest education level: Not on file  Occupational History   Occupation: transportation sales  Tobacco Use   Smoking  status: Never   Smokeless tobacco: Never  Vaping Use   Vaping Use: Never used  Substance and Sexual Activity   Alcohol use: Yes    Alcohol/week: 0.0 standard drinks    Comment: Moderate, beer   Drug use: No   Sexual activity: Not on file  Other Topics Concern   Not on file  Social History Narrative   Not on file   Social Determinants of Health   Financial Resource Strain: Not on file  Food Insecurity: Not on file  Transportation Needs: Not on file  Physical Activity: Not on file  Stress: Not on file  Social Connections: Not on file  Intimate Partner Violence: Not on file    Current Outpatient Medications on File Prior to Visit  Medication Sig Dispense Refill   albuterol (PROVENTIL) (2.5 MG/3ML) 0.083% nebulizer solution Take 3 mLs (2.5 mg total) by nebulization every 6 (six) hours as needed for wheezing or shortness of breath. 75 mL 5   albuterol (VENTOLIN HFA) 108 (90 Base) MCG/ACT inhaler Inhale 2 puffs into the lungs every 6 (six) hours as needed for wheezing or  shortness of breath. 18 g 6   aspirin 81 MG tablet Take 81 mg by mouth daily.     atorvastatin (LIPITOR) 20 MG tablet TAKE 1 TABLET BY MOUTH  DAILY 90 tablet 3   Budeson-Glycopyrrol-Formoterol (BREZTRI AEROSPHERE) 160-9-4.8 MCG/ACT AERO Inhale 2 puffs into the lungs in the morning and at bedtime. 11.8 g 0   CALCIUM PO Take by mouth 2 (two) times daily.      Cholecalciferol (VITAMIN D3) 1000 units CAPS Take 1,000 Units by mouth.     CO ENZYME Q-10 PO Take by mouth daily.      Docusate Sodium (DSS) 100 MG CAPS Take by mouth.     escitalopram (LEXAPRO) 20 MG tablet Take 20 mg by mouth daily.     esomeprazole (NEXIUM) 40 MG capsule Take 40 mg by mouth daily before breakfast.     fenofibrate 160 MG tablet TAKE 1 TABLET BY MOUTH  DAILY WITH FOOD 90 tablet 3   furosemide (LASIX) 20 MG tablet TAKE 1 TABLET BY MOUTH  EVERY OTHER DAY 45 tablet 3   LORazepam (ATIVAN) 1 MG tablet Take 1 mg by mouth every 8 (eight) hours.      Magnesium 250 MG TABS Take 1 tablet by mouth daily.     metoprolol tartrate (LOPRESSOR) 25 MG tablet TAKE 1 TABLET(25 MG) BY MOUTH TWICE DAILY 180 tablet 2   Misc Natural Products (COLON CLEANSE PO) Take by mouth.      Misc. Devices [place] brace. He has a double upright brace that is no longer enough support for the arthritis and instability of his midfoot and ankle on his right.  It is in lieu of surgical correction.     montelukast (SINGULAIR) 10 MG tablet TAKE 1 TABLET(10 MG) BY MOUTH AT BEDTIME 90 tablet 3   nitroGLYCERIN (NITROSTAT) 0.4 MG SL tablet Place 1 tablet (0.4 mg total) under the tongue every 5 (five) minutes as needed. 25 tablet 3   Oxycodone HCl 10 MG TABS Take 10 mg by mouth every 6 (six) hours as needed.     Spacer/Aero-Holding Chambers (AEROCHAMBER MV) inhaler Use as instructed 1 each 0   tamsulosin (FLOMAX) 0.4 MG CAPS capsule Take 0.4 mg by mouth daily.      thiamine 100 MG tablet Take by mouth.     triamcinolone (NASACORT) 55 MCG/ACT AERO nasal inhaler 2 puffs in each nostril     vitamin B-12 (CYANOCOBALAMIN) 1000 MCG tablet Take 1,000 mcg by mouth daily.     zolpidem (AMBIEN) 10 MG tablet Take 10 mg by mouth at bedtime as needed for sleep.      No current facility-administered medications on file prior to visit.    No Known Allergies  Family History  Problem Relation Age of Onset   COPD Mother    Pancreatic cancer Father        pancreatic    Other Cousin        Amyloidosis    BP 120/70 (BP Location: Right Arm, Patient Position: Sitting, Cuff Size: Large)   Pulse 70   Ht 5\' 8"  (1.727 m)   Wt 285 lb (129.3 kg)   SpO2 92%   BMI 43.33 kg/m    Review of Systems denies numbness, headache.  He sees Dr Toy Care for depression.      Objective:   Physical Exam VS: see vs page GEN: no distress HEAD: head: no deformity eyes: no periorbital swelling, no proptosis external nose and ears are normal NECK: supple,  thyroid is not enlarged CHEST WALL: no  deformity LUNGS: clear to auscultation BREASTS:  bilat pseudogynecomastia CV: reg rate and rhythm, no murmur.  GENITALIA:  Normal male.   MUSCULOSKELETAL: muscle bulk and strength are grossly normal.  no joint swelling is seen  gait is normal and steady.   EXTEMITIES: 1+ bilat leg edema, and bilat vv's.   NEURO: sensation is intact to touch on all 4's.  SKIN:  Normal texture and temperature.  No rash or suspicious lesion is visible.  Normal male hair distribution.  NODES:  None palpable at the neck.   PSYCH: alert, well-oriented.  Does not appear anxious nor depressed.    outside test results are reviewed:  CT (2018) no mention is made of the pituitary.  TSH=2.1 PSA=0.3 Testosterone (on clomid)=29 Lab Results  Component Value Date   WBC 6.7 01/10/2021   HGB 12.8 (L) 01/10/2021   HCT 40.5 01/10/2021   MCV 97.8 01/10/2021   PLT 217 01/10/2021       Assessment & Plan:  Low testosterone, new to me, uncontrolled.    Patient Instructions  Blood tests are requested for you today.  We'll let you know about the results.  Based on the results, we'll probably need to start 1 pump of androgel per day.

## 2021-09-21 NOTE — Progress Notes (Signed)
Mild chronic anemai of hgb 11.6gm% down from 12.8gm% 8 monnths ago but similar to 1 year ago persists. Definitely down from 13.1gm% 2 and 3 years ago. Pls talk to PCP [name], MD

## 2021-09-22 ENCOUNTER — Other Ambulatory Visit: Payer: Self-pay

## 2021-09-24 ENCOUNTER — Telehealth: Payer: Self-pay

## 2021-09-24 DIAGNOSIS — I5032 Chronic diastolic (congestive) heart failure: Secondary | ICD-10-CM

## 2021-09-24 DIAGNOSIS — R0602 Shortness of breath: Secondary | ICD-10-CM

## 2021-09-24 NOTE — Telephone Encounter (Signed)
Spoke with the patient and his wife and advised them that Dr. Radford Pax recommends that he proceed with a lexiscan myoview. Instructions have been reviewed and patient verbalized understanding. Orders have been placed and they are aware they will be called to have the test scheduled.

## 2021-09-24 NOTE — Telephone Encounter (Signed)
-----   Message from [name], MD sent at 09/23/2021  5:27 PM EDT ----- Please let patient know that after review of his chart further and Dr. Golden Pop recent note I have recommended that we proceed with a Lexiscan Myoview just to make sure were not missing occult progression of coronary disease that could be causing part of his shortness of breath.  Also I would like Gae Bon to try to get a download on his device with his CPAP

## 2021-09-24 NOTE — Telephone Encounter (Signed)
Shared Decision Making/Informed Consent The risks [chest pain, shortness of breath, cardiac arrhythmias, dizziness, blood pressure fluctuations, myocardial infarction, stroke/transient ischemic attack, nausea, vomiting, allergic reaction, radiation exposure, metallic taste sensation and life-threatening complications (estimated to be 1 in 10,000)], benefits (risk stratification, diagnosing coronary artery disease, treatment guidance) and alternatives of a nuclear stress test were discussed in detail with Keith Bernard and he agrees to proceed.

## 2021-09-26 ENCOUNTER — Other Ambulatory Visit: Payer: Self-pay

## 2021-09-26 ENCOUNTER — Ambulatory Visit (HOSPITAL_COMMUNITY): Payer: Medicare Other | Attending: Cardiology

## 2021-09-26 ENCOUNTER — Other Ambulatory Visit: Payer: Medicare Other | Admitting: *Deleted

## 2021-09-26 DIAGNOSIS — I5032 Chronic diastolic (congestive) heart failure: Secondary | ICD-10-CM

## 2021-09-26 DIAGNOSIS — I2510 Atherosclerotic heart disease of native coronary artery without angina pectoris: Secondary | ICD-10-CM

## 2021-09-26 LAB — ECHOCARDIOGRAM COMPLETE
Area-P 1/2: 2.48 cm2
S' Lateral: 3.5 cm

## 2021-09-26 MED ORDER — PERFLUTREN LIPID MICROSPHERE
3.0000 mL | INTRAVENOUS | Status: AC | PRN
  Administered 2021-09-26: 3 mL via INTRAVENOUS

## 2021-09-27 LAB — TEST AUTHORIZATION

## 2021-09-27 LAB — COMPREHENSIVE METABOLIC PANEL
ALT: 9 IU/L (ref 0–44)
AST: 16 IU/L (ref 0–40)
Albumin/Globulin Ratio: 1.6 (ref 1.2–2.2)
Albumin: 4.2 g/dL (ref 3.7–4.7)
Alkaline Phosphatase: 86 IU/L (ref 44–121)
BUN/Creatinine Ratio: 8 — ABNORMAL LOW (ref 10–24)
BUN: 8 mg/dL (ref 8–27)
Bilirubin Total: 0.7 mg/dL (ref 0.0–1.2)
CO2: 26 mmol/L (ref 20–29)
Calcium: 9.6 mg/dL (ref 8.6–10.2)
Chloride: 99 mmol/L (ref 96–106)
Creatinine, Ser: 1.02 mg/dL (ref 0.76–1.27)
Globulin, Total: 2.7 g/dL (ref 1.5–4.5)
Glucose: 100 mg/dL — ABNORMAL HIGH (ref 70–99)
Potassium: 4.5 mmol/L (ref 3.5–5.2)
Sodium: 140 mmol/L (ref 134–144)
Total Protein: 6.9 g/dL (ref 6.0–8.5)
eGFR: 76 mL/min/{1.73_m2} (ref >59)

## 2021-09-27 LAB — LIPID PANEL
Chol/HDL Ratio: 2.3 ratio (ref 0.0–5.0)
Cholesterol, Total: 121 mg/dL (ref 100–199)
HDL: 53 mg/dL (ref >39)
LDL Chol Calc (NIH): 51 mg/dL (ref 0–99)
Triglycerides: 86 mg/dL (ref 0–149)
VLDL Cholesterol Cal: 17 mg/dL (ref 5–40)

## 2021-09-27 LAB — QUANTIFERON-TB GOLD PLUS
Mitogen-NIL: >10 IU/mL
NIL: 0.02 IU/mL
QuantiFERON-TB Gold Plus: POSITIVE — AB
TB1-NIL: 0.65 IU/mL
TB2-NIL: 0.47 IU/mL

## 2021-09-27 LAB — ANTI-DNA ANTIBODY, DOUBLE-STRANDED: ds DNA Ab: <1 IU/mL

## 2021-09-27 LAB — RHEUMATOID FACTOR: Rheumatoid fact SerPl-aCnc: <14 IU/mL (ref <14)

## 2021-09-27 LAB — IGE: IgE (Immunoglobulin E), Serum: 370 kU/L — ABNORMAL HIGH (ref <=114)

## 2021-09-27 LAB — CYCLIC CITRUL PEPTIDE ANTIBODY, IGG: Cyclic Citrullin Peptide Ab: <16 UNITS

## 2021-09-27 LAB — ANTI-NUCLEAR AB-TITER (ANA TITER): ANA Titer 1: 1:80 {titer} — ABNORMAL HIGH

## 2021-09-27 LAB — TESTOSTERONE, FREE & TOTAL
Free Testosterone: 2.2 pg/mL — ABNORMAL LOW (ref 30.0–135.0)
Testosterone, Total, LC-MS-MS: 20 ng/dL — ABNORMAL LOW (ref 250–1100)

## 2021-09-27 LAB — ANA: Anti Nuclear Antibody (ANA): POSITIVE — AB

## 2021-09-27 LAB — PROLACTIN: Prolactin: 9.4 ng/mL (ref 2.0–18.0)

## 2021-09-29 ENCOUNTER — Telehealth (HOSPITAL_COMMUNITY): Payer: Self-pay

## 2021-09-29 ENCOUNTER — Telehealth: Payer: Self-pay | Admitting: *Deleted

## 2021-09-29 NOTE — Telephone Encounter (Signed)
Download placed to dr Radford Pax in a staff message. 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) [place], [address] Compliance Report Usage 08/10/2021 - 09/08/2021 Usage days 28/30 days (93%) >= 4 hours 7 days (23%) < 4 hours 21 days (70%) Usage hours 83 hours 12 minutes Average usage (total days) 2 hours 46 minutes Average usage (days used) 2 hours 58 minutes Median usage (days used) 2 hours 48 minutes S9 VPAP Auto Serial number [id number] Mode VAuto Max IPAP 20 cmH2O Min EPAP 4 cmH2O Pressure Support 4 cmH2O Therapy Leaks - L/min Median: 37.2 95th percentile: 79.8 Maximum: 100.3 Events per hour AI: 23.7 HI: 0.0 AHI: 23.7 Apnea Index Central: 0.0 Obstructive: 1.3 Unknown: 22.3 Usage - hours Printed on 09/29/2021 - [employer] AirView version 4.38.0-3.0 Page 1 of

## 2021-09-29 NOTE — Telephone Encounter (Signed)
Spoke with the patient, detailed instructions given. He stated that he would be here for his test. Asked to call back with any questions. S.Tremond Shimabukuro EMTP 

## 2021-09-29 NOTE — Telephone Encounter (Signed)
-----   Message from Antonieta Iba, [place] sent at 09/24/2021 11:49 AM EDT ----- Stark Bray,  Can you please get a [name] on this patient for Dr. Radford Pax? Thanks! ----- Message ----- From: [name], MD Sent: 09/23/2021   5:28 PM EDT To: Antonieta Iba, RN  Please let patient know that after review of his chart further and Dr. Golden Pop recent note I have recommended that we proceed with a Lexiscan Myoview just to make sure were not missing occult progression of coronary disease that could be causing part of his shortness of breath.  Also I would like Gae Bon to try to get a download on his device with his CPAP

## 2021-09-30 ENCOUNTER — Ambulatory Visit (HOSPITAL_COMMUNITY): Payer: Medicare Other | Attending: Internal Medicine

## 2021-09-30 ENCOUNTER — Other Ambulatory Visit: Payer: Self-pay

## 2021-09-30 DIAGNOSIS — I5032 Chronic diastolic (congestive) heart failure: Secondary | ICD-10-CM | POA: Diagnosis not present

## 2021-09-30 DIAGNOSIS — R0602 Shortness of breath: Secondary | ICD-10-CM | POA: Diagnosis not present

## 2021-09-30 LAB — MYOCARDIAL PERFUSION IMAGING
LV dias vol: 104 mL (ref 62–150)
LV sys vol: 38 mL
Nuc Stress EF: 63 %
Peak HR: 68 {beats}/min
Rest HR: 65 {beats}/min
Rest Nuclear Isotope Dose: 10.8 mCi
SDS: 0
SRS: 0
SSS: 1
ST Depression (mm): 0 mm
Stress Nuclear Isotope Dose: 30.1 mCi
TID: 0.82

## 2021-09-30 MED ORDER — REGADENOSON 0.4 MG/5ML IV SOLN
0.4000 mg | Freq: Once | INTRAVENOUS | Status: AC
  Administered 2021-09-30: 0.4 mg via INTRAVENOUS

## 2021-09-30 MED ORDER — TECHNETIUM TC 99M TETROFOSMIN IV KIT
10.8000 | PACK | Freq: Once | INTRAVENOUS | Status: AC | PRN
  Administered 2021-09-30: 10.8 via INTRAVENOUS
  Filled 2021-09-30: qty 11

## 2021-09-30 MED ORDER — TECHNETIUM TC 99M TETROFOSMIN IV KIT
30.1000 | PACK | Freq: Once | INTRAVENOUS | Status: AC | PRN
  Administered 2021-09-30: 30.1 via INTRAVENOUS
  Filled 2021-09-30: qty 31

## 2021-10-01 ENCOUNTER — Telehealth: Payer: Self-pay | Admitting: Endocrinology

## 2021-10-01 ENCOUNTER — Other Ambulatory Visit: Payer: Self-pay | Admitting: Endocrinology

## 2021-10-01 DIAGNOSIS — R7989 Other specified abnormal findings of blood chemistry: Secondary | ICD-10-CM

## 2021-10-01 MED ORDER — TESTOSTERONE 12.5 MG/ACT (1%) TD GEL: 1.0000 | Freq: Every day | TRANSDERMAL | 0 refills | Status: AC

## 2021-10-01 NOTE — Telephone Encounter (Signed)
Pt wife called to get lab result from last visit and to find out recommendation on care from provider.  Please call [phone]

## 2021-10-03 ENCOUNTER — Telehealth: Payer: Self-pay | Admitting: *Deleted

## 2021-10-03 NOTE — Telephone Encounter (Signed)
Per patient has a nasal mask, he just changed it , he has NOT changed his cushions often but he will start once a month.

## 2021-10-03 NOTE — Telephone Encounter (Signed)
-----   Message from [name], MD sent at 09/29/2021  6:17 PM EDT ----- AHI is high but he has a very high mask leak - please find out how old his mask is and how often he changes his cushion.  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) [place], [address] Compliance Report Usage 08/10/2021 - 09/08/2021 Usage days 28/30 days (93%) >= 4 hours 7 days (23%) < 4 hours 21 days (70%) Usage hours 83 hours 12 minutes Average usage (total days) 2 hours 46 minutes Average usage (days used) 2 hours 58 minutes Median usage (days used) 2 hours 48 minutes S9 VPAP Auto Serial number [id number] Mode VAuto Max IPAP 20 cmH2O Min EPAP 4 cmH2O Pressure Support 4 cmH2O Therapy Leaks - L/min Median: 37.2 95th percentile: 79.8 Maximum: 100.3 Events per hour AI: 23.7 HI: 0.0 AHI: 23.7 Apnea Index Central: 0.0 Obstructive: 1.3 Unknown: 22.3 Usage - hours Printed on 09/29/2021 - [employer] AirView version 4.38.0-3.0 Page 1 of  ----- Message ----- From: Antonieta Iba, RN Sent: 09/24/2021  11:49 AM EDT To: Freada Bergeron, CMA  Stark Bray,  Can you please get a [name] on this patient for Dr. Radford Pax? Thanks! ----- Message ----- From: [name], MD Sent: 09/23/2021   5:28 PM EDT To: Antonieta Iba, RN  Please let patient know that after review of his chart further and Dr. Golden Pop recent note I have recommended that we proceed with a Lexiscan Myoview just to make sure were not missing occult progression of coronary disease that could be causing part of his shortness of breath.  Also I would like Gae Bon to try to get a download on his device with his CPAP

## 2021-10-15 ENCOUNTER — Other Ambulatory Visit: Payer: Self-pay | Admitting: *Deleted

## 2021-10-15 ENCOUNTER — Other Ambulatory Visit: Payer: Self-pay

## 2021-10-15 ENCOUNTER — Telehealth: Payer: Self-pay | Admitting: Cardiology

## 2021-10-15 ENCOUNTER — Ambulatory Visit: Payer: Medicare Other | Admitting: Acute Care

## 2021-10-15 ENCOUNTER — Encounter: Payer: Self-pay | Admitting: Acute Care

## 2021-10-15 ENCOUNTER — Telehealth: Payer: Self-pay | Admitting: Acute Care

## 2021-10-15 ENCOUNTER — Ambulatory Visit (INDEPENDENT_AMBULATORY_CARE_PROVIDER_SITE_OTHER): Payer: Medicare Other

## 2021-10-15 VITALS — BP 130/76 | HR 86 | Wt 289.4 lb

## 2021-10-15 DIAGNOSIS — R490 Dysphonia: Secondary | ICD-10-CM | POA: Diagnosis not present

## 2021-10-15 DIAGNOSIS — R600 Localized edema: Secondary | ICD-10-CM

## 2021-10-15 DIAGNOSIS — R0600 Dyspnea, unspecified: Secondary | ICD-10-CM

## 2021-10-15 DIAGNOSIS — R0602 Shortness of breath: Secondary | ICD-10-CM

## 2021-10-15 DIAGNOSIS — J4540 Moderate persistent asthma, uncomplicated: Secondary | ICD-10-CM

## 2021-10-15 DIAGNOSIS — Z9989 Dependence on other enabling machines and devices: Secondary | ICD-10-CM

## 2021-10-15 DIAGNOSIS — R768 Other specified abnormal immunological findings in serum: Secondary | ICD-10-CM

## 2021-10-15 DIAGNOSIS — J849 Interstitial pulmonary disease, unspecified: Secondary | ICD-10-CM

## 2021-10-15 DIAGNOSIS — G4733 Obstructive sleep apnea (adult) (pediatric): Secondary | ICD-10-CM

## 2021-10-15 NOTE — Progress Notes (Signed)
History of Present Illness Keith Bernard is a 75 y.o. male never  smoker with chronic obstructive lung disease and  asthma, early ILD per HRCT, that has not progressed.He is followed by [name]   Maintenance  Started on Spiriva an Grant Ruts, these are from a deceased family member , and they are working well for him. [name] ok'd this regimen at last OV.    10/15/2021 Pt. Presents for follow up. Last seen 08/2021 with complaints of worsening dyspnea on exertion. He has elevated IgE, and Eosinophils. He has been having trouble with inhalers as they cause hoarseness, and [name] spoke with patient and his wife about  biologics 09/20/2021. 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 I will refer to [facility] to see if he needs treatment. He is doing better on the [place] and Spiriva. We will maintain this treatment .  Labs were discussed. He has what appears to be a chronic anemia, which [name] has asked him to follow up with PCP about.  As he is better on the inhalers, patient and his wife are happy to continue this regimen. Thy understand biologics may be a good option  if current regimen no longer controls his dyspnea. He is very deconditioned and has chronic anemia , and both are impacting his dyspnea.   Pt has developed lower extremity edema. Echo shows   Test Results: Labs drawn 09/19/2021 and reviewed personally by me: ANA 1:80>> Elevated>> Dense Fine Speckled CBC Latest Ref Rng & Units 09/19/2021 01/10/2021 05/07/2020  WBC 4.0 - 10.5 K/uL 7.8 6.7 7.4  Hemoglobin 13.0 - 17.0 g/dL 11.6(L) 12.8(L) 11.9(L)  Hematocrit 39.0 - 52.0 % 35.7(L) 40.5 35.9(L)  Platelets 150.0 - 400.0 K/uL 214.0 217 231.0   ds DNA Ab IU/mL <1   IgE 370 ( 235 one  year ago)  Quantiferon Gold>> Positive ( Has had a + TB exposure in the past as family member exposed him when  he was a child.) Cyclic Citrullin Peptide Ab UNITS <16    Rhuematoid fact SerPl-aCnc <14 IU/mL <14    Prolactin 2.0 - 18.0 ng/mL 9.4   Elevated IgE level June 2021 with eosinophils 200 cells per cubic millimeter June 2021   Echo 08/2021 IMPRESSIONS Left ventricular ejection fraction, by estimation, is 60 to 65%. The left ventricle has normal function. The left ventricle has no regional wall motion abnormalities. There is mild left ventricular hypertrophy. Left ventricular diastolic parameters are consistent with Grade I diastolic dysfunction (impaired relaxation). Elevated left atrial pressure. 2. Right ventricular systolic function is normal. The right ventricular size is normal. 3. Left atrial size was mildly dilated. The mitral valve is normal in structure. Trivial mitral valve regurgitation. No evidence of mitral stenosis. 4. The aortic valve is tricuspid. Aortic valve regurgitation is not visualized. No aortic stenosis is present. 5. The inferior vena cava is normal in size with greater than 50% respiratory variability, suggesting right atrial pressure of 3 mmHg.  CBC Latest Ref Rng & Units 09/19/2021 01/10/2021 05/07/2020  WBC 4.0 - 10.5 K/uL 7.8 6.7 7.4  Hemoglobin 13.0 - 17.0 g/dL 11.6(L) 12.8(L) 11.9(L)  Hematocrit 39.0 - 52.0 % 35.7(L) 40.5 35.9(L)  Platelets 150.0 - 400.0 K/uL 214.0 217 231.0    BMP Latest Ref Rng & Units 09/26/2021 01/10/2021 01/12/2020  Glucose 70 - 99 mg/dL 100(H) 101(H)  94  BUN 8 - 27 mg/dL 8 11 14   Creatinine 0.76 - 1.27 mg/dL 1.02 0.91 0.83  BUN/Creat Ratio 10 - 24 8(L) - -  Sodium 134 - 144 mmol/L 140 137 139  Potassium 3.5 - 5.2 mmol/L 4.5 4.3 4.2  Chloride 96 - 106 mmol/L 99 102 101  CO2 20 - 29 mmol/L 26 28 27   Calcium 8.6 - 10.2 mg/dL 9.6 9.0 9.0    BNP No results found for: BNP  ProBNP    Component Value Date/Time   PROBNP 367 10/17/2018 1603   PROBNP 232.0 (H) 09/27/2018 1358    PFT    Component Value Date/Time   FEV1PRE  1.85 06/19/2020 1256   FEV1POST 2.04 06/19/2020 1256   FVCPRE 2.44 06/19/2020 1256   FVCPOST 2.58 06/19/2020 1256   TLC 4.78 06/19/2020 1256   DLCOUNC 18.83 06/19/2020 1256   PREFEV1FVCRT 75 06/19/2020 1256   PSTFEV1FVCRT 79 06/19/2020 1256    DG Chest 2 View  Result Date: 10/15/2021 CLINICAL DATA:  Shortness of breath EXAM: CHEST - 2 VIEW COMPARISON:  09/27/2018, chest CT from 07/30/2021 FINDINGS: Cardiac shadow is mildly enlarged but stable. The lungs are well aerated bilaterally. Elevation of the right hemidiaphragm is again identified with some compressive atelectasis similar to that seen on prior CT. No focal confluent infiltrate is noted. No bony abnormality is noted. IMPRESSION: Stable elevation of the left hemidiaphragm with atelectatic changes. No acute abnormality noted. Electronically Signed   By: Inez Catalina M.D.   On: 10/15/2021 15:39   MYOCARDIAL PERFUSION IMAGING  Result Date: 09/30/2021   Findings are equivocal. The study is intermediate risk.   No ST deviation was noted.   Left ventricular function is normal. Nuclear stress EF: 63 %. The left ventricular ejection fraction is normal (55-65%). End diastolic cavity size is normal.   Prior study available for comparison from 03/01/2012. Findings: Negative for stress induced arrhythmias. - there is notable diaphragmatic attenuation in the stress portion of the exam; suspect that associated inferior perfusion defect is artifactual in nature. Conclusions: Stress test is negative for infarction.  Equivocal study for ischemia in the setting of stress sub-diaphragmatic artifact with inferior perfusion defect.  There were no other perfusion defects.   ECHOCARDIOGRAM COMPLETE  Result Date: 09/26/2021    ECHOCARDIOGRAM REPORT   Patient Name:   Keith Bernard Date of Exam: 09/26/2021 Medical Rec #:  [id number]        Height:       68.0 in Accession #:    [id number]       Weight:       285.0 lb Date of Birth:  Apr 22, 1946       BSA:           2.376 m Patient Age:    21 years         BP:           138/80 mmHg Patient Gender: M                HR:           72 bpm. Exam Location:  [address] Procedure: 2D Echo, Cardiac Doppler, Color Doppler and Intracardiac            Opacification Agent Indications:    I50.32 CHF  History:        Patient has prior history of Echocardiogram examinations, most  recent 10/11/2018. CHF and Hypertrophic Cardiomyopathy, CAD and                 Previous Myocardial Infarction, Alcohol abuse, Arrythmias:RBBB;                 Risk Factors:Hypertension, Dyslipidemia and Morbid obesity.  Sonographer:    Coralyn Helling RDCS Referring Phys: [phone] [name] R TURNER  Sonographer Comments: Technically difficult study due to poor echo windows and patient is morbidly obese. Image acquisition challenging due to patient body habitus. IMPRESSIONS  1. Left ventricular ejection fraction, by estimation, is 60 to 65%. The left ventricle has normal function. The left ventricle has no regional wall motion abnormalities. There is mild left ventricular hypertrophy. Left ventricular diastolic parameters are consistent with Grade I diastolic dysfunction (impaired relaxation). Elevated left atrial pressure.  2. Right ventricular systolic function is normal. The right ventricular size is normal.  3. Left atrial size was mildly dilated.  4. The mitral valve is normal in structure. Trivial mitral valve regurgitation. No evidence of mitral stenosis.  5. The aortic valve is tricuspid. Aortic valve regurgitation is not visualized. No aortic stenosis is present.  6. The inferior vena cava is normal in size with greater than 50% respiratory variability, suggesting right atrial pressure of 3 mmHg. FINDINGS  Left Ventricle: Left ventricular ejection fraction, by estimation, is 60 to 65%. The left ventricle has normal function. The left ventricle has no regional wall motion abnormalities. Definity contrast agent was given IV to delineate the left  ventricular  endocardial borders. The left ventricular internal cavity size was normal in size. There is mild left ventricular hypertrophy. Left ventricular diastolic parameters are consistent with Grade I diastolic dysfunction (impaired relaxation). Elevated left atrial pressure. Right Ventricle: The right ventricular size is normal. Right ventricular systolic function is normal. Left Atrium: Left atrial size was mildly dilated. Right Atrium: Right atrial size was normal in size. Pericardium: There is no evidence of pericardial effusion. Mitral Valve: The mitral valve is normal in structure. Trivial mitral valve regurgitation. No evidence of mitral valve stenosis. Tricuspid Valve: The tricuspid valve is normal in structure. Tricuspid valve regurgitation is trivial. No evidence of tricuspid stenosis. Aortic Valve: The aortic valve is tricuspid. Aortic valve regurgitation is not visualized. No aortic stenosis is present. Pulmonic Valve: The pulmonic valve was normal in structure. Pulmonic valve regurgitation is not visualized. No evidence of pulmonic stenosis. Aorta: The aortic root is normal in size and structure. Venous: The inferior vena cava is normal in size with greater than 50% respiratory variability, suggesting right atrial pressure of 3 mmHg. IAS/Shunts: No atrial level shunt detected by color flow Doppler.  LEFT VENTRICLE PLAX 2D LVIDd:         4.90 cm   Diastology LVIDs:         3.50 cm   LV e' medial:    5.55 cm/s LV PW:         1.20 cm   LV E/e' medial:  15.2 LV IVS:        1.40 cm   LV e' lateral:   7.29 cm/s LVOT diam:     2.10 cm   LV E/e' lateral: 11.6 LV SV:         65 LV SV Index:   27 LVOT Area:     3.46 cm  RIGHT VENTRICLE             IVC RV S prime:     15.60 cm/s  IVC diam: 1.40 cm TAPSE (M-mode): 3.0 cm RVSP:           24.9 mmHg LEFT ATRIUM             Index        RIGHT ATRIUM           Index LA diam:        3.90 cm 1.64 cm/m   RA Pressure: 3.00 mmHg LA Vol (A2C):   78.2 ml 32.92 ml/m  RA  Area:     18.50 cm LA Vol (A4C):   90.2 ml 37.97 ml/m  RA Volume:   47.60 ml  20.04 ml/m LA Biplane Vol: 88.2 ml 37.13 ml/m  AORTIC VALVE LVOT Vmax:   80.00 cm/s LVOT Vmean:  59.800 cm/s LVOT VTI:    0.188 m  AORTA Ao Root diam: 3.60 cm Ao Asc diam:  3.60 cm MITRAL VALVE                TRICUSPID VALVE MV Area (PHT): 2.48 cm     TR Peak grad:   21.9 mmHg MV Decel Time: 306 msec     TR Vmax:        234.00 cm/s MV E velocity: 84.40 cm/s   Estimated RAP:  3.00 mmHg MV A velocity: 104.00 cm/s  RVSP:           24.9 mmHg MV E/A ratio:  0.81                             SHUNTS                             Systemic VTI:  0.19 m                             Systemic Diam: 2.10 cm Kirk Ruths MD Electronically signed by Kirk Ruths MD Signature Date/Time: 09/26/2021/12:22:51 PM    Final      Past medical hx 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 EF 35% but now by echo ER 55% 04/2009   Depression 05/04/2016   DJD (degenerative joint disease)    Dyslipidemia    ETOH abuse    Foot ulcer ([place]) 07/07/2017   GERD (gastroesophageal reflux disease)    Hypertension    Neuropathy    Obesity    OSA (obstructive sleep apnea)    Moderate OSA w AHI 17.7/hr now on VPAP at EEP at 7cm H2O, min PS 3cm H2O,max PS 15cm H2O   RBBB      Social History   Tobacco Use   Smoking status: Never   Smokeless tobacco: Never  Vaping Use   Vaping Use: Never used  Substance Use Topics   Alcohol use: Yes    Alcohol/week: 0.0 standard drinks    Comment: Moderate, beer   Drug use: No    Mr.Mullarkey reports that he has never smoked. He has never used smokeless tobacco. He reports current alcohol use. He reports that  he does  not use drugs.  Tobacco Cessation: Never smoker    Past surgical hx, Family hx, Social hx all reviewed.  Current Outpatient Medications on File Prior to Visit  Medication Sig   albuterol (PROVENTIL) (2.5 MG/3ML) 0.083% nebulizer solution Take 3 mLs (2.5 mg total) by nebulization every 6 (six) hours as needed for wheezing or shortness of breath.   albuterol (VENTOLIN HFA) 108 (90 Base) MCG/ACT inhaler Inhale 2 puffs into the lungs every 6 (six) hours as needed for wheezing or shortness of breath.   aspirin 81 MG EC tablet Take by mouth.   aspirin 81 MG tablet Take 81 mg by mouth daily.   atorvastatin (LIPITOR) 20 MG tablet TAKE 1 TABLET BY MOUTH  DAILY   atorvastatin (LIPITOR) 20 MG tablet Take by mouth.   Budeson-Glycopyrrol-Formoterol (BREZTRI AEROSPHERE) 160-9-4.8 MCG/ACT AERO Inhale 2 puffs into the lungs in the morning and at bedtime.   CALCIUM PO Take by mouth 2 (two) times daily.    Cholecalciferol (VITAMIN D3) 1000 units CAPS Take 1,000 Units by mouth.   CO ENZYME Q-10 PO Take by mouth daily.    Docusate Sodium (DSS) 100 MG CAPS Take by mouth.   escitalopram (LEXAPRO) 20 MG tablet Take 20 mg by mouth daily.   esomeprazole (NEXIUM) 40 MG capsule Take 40 mg by mouth daily before breakfast.   fenofibrate 160 MG tablet TAKE 1 TABLET BY MOUTH  DAILY WITH FOOD   fenofibrate 160 MG tablet Take by mouth.   furosemide (LASIX) 20 MG tablet TAKE 1 TABLET BY MOUTH  EVERY OTHER DAY   LORazepam (ATIVAN) 1 MG tablet Take 1 mg by mouth every 8 (eight) hours.   Magnesium 250 MG TABS Take 1 tablet by mouth daily.   metoprolol tartrate (LOPRESSOR) 25 MG tablet TAKE 1 TABLET(25 MG) BY MOUTH TWICE DAILY   mirtazapine (REMERON) 15 MG tablet Take 15 mg by mouth at bedtime.   Misc Natural Products (COLON CLEANSE PO) Take by mouth.    Misc. Devices [place] brace. He has a double upright brace that is no longer enough support for the arthritis and instability of his midfoot and ankle on his right.   It is in lieu of surgical correction.   montelukast (SINGULAIR) 10 MG tablet TAKE 1 TABLET(10 MG) BY MOUTH AT BEDTIME   nitroGLYCERIN (NITROSTAT) 0.4 MG SL tablet Place 1 tablet (0.4 mg total) under the tongue every 5 (five) minutes as needed.   Oxycodone HCl 10 MG TABS Take 10 mg by mouth every 6 (six) hours as needed.   Spacer/Aero-Holding Chambers (AEROCHAMBER MV) inhaler Use as instructed   tamsulosin (FLOMAX) 0.4 MG CAPS capsule Take 0.4 mg by mouth daily.    Testosterone 12.5 MG/ACT (1%) GEL Apply 1 Squirt topically daily.   thiamine 100 MG tablet Take by mouth.   triamcinolone (NASACORT) 55 MCG/ACT AERO nasal inhaler 2 puffs in each nostril   vitamin B-12 (CYANOCOBALAMIN) 1000 MCG tablet Take 1,000 mcg by mouth daily.   zolpidem (AMBIEN) 10 MG tablet Take 10 mg by mouth at bedtime as needed for sleep.    No current facility-administered medications on file prior to visit.     No Known Allergies  Review Of Systems:  Constitutional:   No  weight loss, night sweats,  Fevers, chills, +fatigue, or  lassitude.  HEENT:   No headaches,  Difficulty swallowing,  Tooth/dental problems, or  Sore throat,  No sneezing, itching, ear ache, nasal congestion, post nasal drip,   CV:  No chest pain,  Orthopnea, PND, swelling in lower extremities, anasarca, dizziness, palpitations, syncope.   GI  No heartburn, indigestion, abdominal pain, nausea, vomiting, diarrhea, change in bowel habits, loss of appetite, bloody stools.   Resp: + shortness of breath with exertion or at less rest.  No excess mucus, no productive cough,  No non-productive cough,  No coughing up of blood.  No change in color of mucus.  No wheezing.  No chest wall deformity  Skin: no rash or lesions.  GU: no dysuria, change in color of urine, no urgency or frequency.  No flank pain, no hematuria   MS:  No joint pain or swelling.  No decreased range of motion.  No back pain.  Psych:  No change in mood or affect. No  depression or anxiety.  No memory loss.   Vital Signs BP 130/76   Pulse 86   Wt 289 lb 6.4 oz (131.3 kg)   SpO2 90%   BMI 44.00 kg/m    Physical Exam:  General- No distress,  A&Ox3, pleasant  ENT: No sinus tenderness, TM clear, pale nasal mucosa, no oral exudate,no post nasal drip, no LAN Cardiac: S1, S2, regular rate and rhythm, no murmur Chest: No wheeze/ rales/ dullness; no accessory muscle use, no nasal flaring, no sternal retractions Abd.: Soft Non-tender, ND, BS +, Body mass index is 44 kg/m.  Ext: No clubbing cyanosis, edema Neuro:  Physically deconditioned Skin: No rashes, warm and dry, no lesions  Psych: normal mood and behavior   Assessment/Plan   Patient Instructions  Moderate persistent asthma without complication with elevated IgE and blood eps   Better controlled on [place] and Spiriva   Plan - Continue  wixela 2 puffs twice daily and spiriva handihaler  2 puff once daily   - Eosinophils WNL and IgE is elevated  - if current regimen ( which he has improved on) fails or becomes less effective consider biologic (fasenral first choice) time limited trial - -Use albuterol 2 puffs as needed for shortness of breath or wheezing - Can also use Albuterol  nebs as they are less expensive,                    OSA on CPAP   - continue cpap  per Dr. Radford Pax   Abnormal CT of the chest Sept 2021 - early ILA otherwise call interstitial lung abnormalities Still noted in 06/2021 scan, but has not progressed  Plan Not healthy enough to tolerate biopsy - recommend serial monitorng approach]  - check ana, ds-dna, RF, CCP and Quantiferon Gold>> see results noted above  - Consider 6 minute walk - Will need follow up HRCT in 6 months or one ear ( Per MR)     Hoarseness of voice   made worse recently by BREZTRI Resolved with change to [place] and [place] and Spiriva   Dyspnea    - possibly asthma / weight gain and cardiac issues may be  contributing + Lower extremity edema - Echo + for Grade I diastolic dysfunction - Does not weigh daily - ? If he is watching his Na intake Plan - Please follow up with cardiology regarding increased swelling.  - Start weighing daily.>> Call cardiology for increase in weight for Lasix dosing - Watch your salt intake. - CXR today to evaluate for edema which may be contributing to dyspnea - We  will call you with results tomorrow - Follow up with cardiology prn     ANA + Quanteferon Gold >> Positive IgE increase from previous 235>> 370  But better on Wixela and Spiriva Has 6 months of free medication Plan Consider Biologic if current regimen is no longer effective  Addendum  CXR 10/15/2021 Cardiac shadow is mildly enlarged but stable. The lungs are well aerated bilaterally. Elevation of the right hemidiaphragm is again identified with some compressive atelectasis similar to that seen on prior CT. No focal confluent infiltrate is noted. No bony abnormality is noted.   IMPRESSION: Stable elevation of the left hemidiaphragm with atelectatic changes. No acute abnormality noted.   I spent 45 minutes dedicated to the care of this patient on the date of this encounter to include pre-visit review of records, face-to-face time with the patient discussing conditions above, post visit ordering of testing, clinical documentation with the electronic health record, making appropriate referrals as documented, and communicating necessary information to the patient's healthcare team.   Magdalen Spatz, NP 10/15/2021  7:40 PM

## 2021-10-15 NOTE — Telephone Encounter (Signed)
Spent almost 30 min on the phone with pt wife. She is very scattered while trying to report concerns. Reports BLEE, right slightly greater than left. Feet are so swollen that pt cannot get his shoes on. Denies pitting edema, reports "very tight", denies weeping from legs. They are at pulmonologist when speaking with me. Wife states that pulmonologist is doing CXR currently, but told them to reach out to cardiology. They also instructed pt to weigh himself daily (pt does not currently do this). The swelling has been occurring/worsening since last Tuesday/Wednesday after temporary spinal cord stimulator was removed (placing permanent one next month).  When asked about the 10 pd wt gain she reports this was an estimate as they have not been weighing pt at home.  Advised to take daily at home and discussed how/when. They are agreeable to plan. Will forward to nurse to review w/ MD.   Pt is also concerned b/c recently prescribed Remeron.  Says that pharmacist told him that this could interfere with his heart medications.  He has not started this as he wanted to discuss with cardiologist first.  Advised I will forward this to pharmD to review/advise.  Aware office will call once pharmD & MD have advised. Wife and pt agreeable to plan.

## 2021-10-15 NOTE — Telephone Encounter (Signed)
Pt c/o swelling: STAT is pt has developed SOB within 24 hours  If swelling, where is the swelling located? Both legs  How much weight have you gained and in what time span? 10 lbs in a week  Have you gained 3 pounds in a day or 5 pounds in a week? Yes   Do you have a log of your daily weights (if so, list)? No   Are you currently taking a fluid pill? Yes   Are you currently SOB? Yes   Have you traveled recently? No

## 2021-10-15 NOTE — Telephone Encounter (Signed)
Patient is prescribed furosemide 20mg  every other day.  Per last vitals: weight today 289#.  Weight on 09/19/21: 285# so likely not a 10# weight gain.  Renal function normal on 09/26/21.  Pulmonology noted SOB however unclear if due to asthma. Further testing was ordered.  Recommend increasing furosemide to 40mg  daily for 3 days and then resume normal schedule and to weigh himself daily.

## 2021-10-15 NOTE — Telephone Encounter (Signed)
Please call patient and let him know his CXR did not show any overt pulmonary edema. This is good news. Have him follow up with cardiology regarding weight gain and lower extremity edema.Remind him to watch his salt intake, and weight daily

## 2021-10-15 NOTE — Patient Instructions (Addendum)
It is good to see you today. Continue the Wixela 2 puffs in the morning and 2 puffs in the evening. Use Spiriva 2 puffs once daily Rinse mouth after use Use rescue albuterol as needed for shortness of breath or wheezing, no more than 3 times a day We will refer you to [place] for the positive Quantiferon Gold. Please follow up with cardiology regarding increased swelling.  Start weighing daily. Watch your salt intake. CXR today We will call you with results tomorrow Follow up with [name] or NP in 2 months.  Please contact office for sooner follow up if symptoms do not improve or worsen or seek emergency care

## 2021-10-16 ENCOUNTER — Encounter: Payer: Self-pay | Admitting: Cardiology

## 2021-10-16 LAB — BRAIN NATRIURETIC PEPTIDE: Pro B Natriuretic peptide (BNP): 203 pg/mL — ABNORMAL HIGH (ref 0.0–100.0)

## 2021-10-16 NOTE — Telephone Encounter (Signed)
Lm x1 for patient.  

## 2021-10-16 NOTE — Telephone Encounter (Signed)
Spoke with the patient and his wife and gave recommendations to increase Lasix to 40 mg daily for three days then go back to his normal dose of 20 mg every other day. They are aware to start weighing him daily on the same scale with same clothes on.

## 2021-10-16 NOTE — Telephone Encounter (Signed)
Should be no issues with Remeron and his cardiac medications

## 2021-10-16 NOTE — Telephone Encounter (Signed)
I have called and spoke with pt and his wife and they are aware of results.  Nothing further is needed.

## 2021-11-06 ENCOUNTER — Encounter: Payer: Self-pay | Admitting: Cardiology

## 2021-11-24 ENCOUNTER — Other Ambulatory Visit: Payer: Self-pay

## 2021-11-24 ENCOUNTER — Emergency Department (HOSPITAL_COMMUNITY)
Admission: EM | Admit: 2021-11-24 | Discharge: 2021-11-25 | Disposition: A | Discharge status: Home / Self Care | Payer: Medicare Other | Attending: Emergency Medicine | Admitting: Emergency Medicine

## 2021-11-24 DIAGNOSIS — S51011A Laceration without foreign body of right elbow, initial encounter: Secondary | ICD-10-CM

## 2021-11-24 DIAGNOSIS — Z96652 Presence of left artificial knee joint: Secondary | ICD-10-CM | POA: Insufficient documentation

## 2021-11-24 DIAGNOSIS — Y92009 Unspecified place in unspecified non-institutional (private) residence as the place of occurrence of the external cause: Secondary | ICD-10-CM | POA: Diagnosis not present

## 2021-11-24 DIAGNOSIS — S8991XA Unspecified injury of right lower leg, initial encounter: Secondary | ICD-10-CM | POA: Diagnosis present

## 2021-11-24 DIAGNOSIS — N183 Chronic kidney disease, stage 3 unspecified: Secondary | ICD-10-CM | POA: Insufficient documentation

## 2021-11-24 DIAGNOSIS — Z7951 Long term (current) use of inhaled steroids: Secondary | ICD-10-CM | POA: Insufficient documentation

## 2021-11-24 DIAGNOSIS — I2510 Atherosclerotic heart disease of native coronary artery without angina pectoris: Secondary | ICD-10-CM | POA: Diagnosis not present

## 2021-11-24 DIAGNOSIS — Z7982 Long term (current) use of aspirin: Secondary | ICD-10-CM | POA: Insufficient documentation

## 2021-11-24 DIAGNOSIS — W19XXXA Unspecified fall, initial encounter: Secondary | ICD-10-CM

## 2021-11-24 DIAGNOSIS — Z79899 Other long term (current) drug therapy: Secondary | ICD-10-CM | POA: Insufficient documentation

## 2021-11-24 DIAGNOSIS — S81811A Laceration without foreign body, right lower leg, initial encounter: Secondary | ICD-10-CM | POA: Diagnosis not present

## 2021-11-24 DIAGNOSIS — I5032 Chronic diastolic (congestive) heart failure: Secondary | ICD-10-CM | POA: Diagnosis not present

## 2021-11-24 DIAGNOSIS — J45909 Unspecified asthma, uncomplicated: Secondary | ICD-10-CM | POA: Insufficient documentation

## 2021-11-24 DIAGNOSIS — I130 Hypertensive heart and chronic kidney disease with heart failure and stage 1 through stage 4 chronic kidney disease, or unspecified chronic kidney disease: Secondary | ICD-10-CM | POA: Diagnosis not present

## 2021-11-24 DIAGNOSIS — S8011XA Contusion of right lower leg, initial encounter: Secondary | ICD-10-CM

## 2021-11-24 NOTE — ED Triage Notes (Signed)
Pt brought for a fall at home. Pt c/o right knee pain. Pt has a laceration to his right knee that was bandaged by EMS. Bleeding is controlled at this time. Pt also has a skin tear on his right elbow.

## 2021-11-25 ENCOUNTER — Emergency Department (HOSPITAL_COMMUNITY): Payer: Medicare Other

## 2021-11-25 ENCOUNTER — Encounter (HOSPITAL_COMMUNITY): Payer: Self-pay

## 2021-11-25 MED ORDER — OXYCODONE-ACETAMINOPHEN 5-325 MG PO TABS: 2.0000 | ORAL_TABLET | Freq: Once | ORAL | Status: DC

## 2021-11-25 MED ORDER — CEPHALEXIN 500 MG PO CAPS: 1000.0000 mg | ORAL_CAPSULE | Freq: Two times a day (BID) | ORAL | 0 refills | Status: DC

## 2021-11-25 NOTE — ED Provider Notes (Addendum)
Emergency Medicine Provider Triage Evaluation Note  Keith Bernard , a 75 y.o. male  was evaluated in triage.  Pt complains of fall tonight. Patient has neuropathy, miss-stepped when getting up too fast and tripped over the carpet. Denies head injury or LOC. Right elbow & knee injuries occurred. Unsure of last tetanus. Chart reviewed- tetanus in 2018.   Review of Systems  Positive: Arthralgia, wounds Negative: Head injury, neck pain, chest pain, abdominal pain.   Physical Exam  Ht 5\' 9"  (1.753 m)    Wt 123.4 kg    BMI 40.17 kg/m  Gen:   Awake, no distress   Resp:  Normal effort  MSK:   Skin tears to the right knee & elbow. No significant active bleeding. Moving all major joints of the extremities. Tender over wound areas. No midline spinal tenderness. No chest/abdominal tenderness. 2+ DP & radial pulses.   Medical Decision Making  Medically screening exam initiated at 12:04 AM.  Appropriate orders placed.  Keith Bernard was informed that the remainder of the evaluation will be completed by another provider, this initial triage assessment does not replace that evaluation, and the importance of remaining in the ED until their evaluation is complete.  [address] PA-C 11/25/21 0011    Amaryllis Dyke, PA-C 11/25/21 0012    Merryl Hacker, MD 11/25/21 [phone]

## 2021-11-25 NOTE — ED Provider Notes (Signed)
[facility] EMERGENCY DEPARTMENT Provider Note   CSN: [id number] Arrival date & time: 11/24/21  2348     History Chief Complaint  Patient presents with   Fall   Leg Injury    Keith Bernard is a 75 y.o. male.  HPI Patient fell on the wood floor at home at about 10:30 PM.  He reports he does fall frequently.  He has bad balance due to peripheral neuropathy and chronic low back pain.  Patient reports that he landed on his right knee and elbow.  Patient reports that they were bleeding at the time and he was concerned for significant injury.  He did have to wait in the waiting room all night.  He reports now that he has had time to observe the wounds and the bleeding has stopped, he does not think they are as serious as they seemed first.  Patient denies he sustained any other injury.  He denies he has chest pain.  No shortness of breath.  No pain with deep breath.  He did not hit his head.  No neck pain.  He has multiple chronic medical problems and difficulty with stability and gait.    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 EF 35% but now by echo ER 55% 04/2009   Depression 05/04/2016   DJD (degenerative joint disease)    Dyslipidemia    ETOH abuse    Foot ulcer ([place]) 07/07/2017   GERD (gastroesophageal reflux disease)    Hypertension    Neuropathy    Obesity    OSA (obstructive sleep apnea)    Moderate OSA w AHI 17.7/hr now on VPAP at EEP at 7cm H2O, min PS 3cm H2O,max PS 15cm H2O   RBBB     Patient Active Problem List   Diagnosis Date Noted   Low testosterone 09/19/2021   Medication management 10/09/2020   Healthcare maintenance 10/09/2020   SOB  (shortness of breath) 10/09/2020   Abnormal findings on diagnostic imaging of lung 10/09/2020   Chronic diastolic heart failure ([place]) 10/03/2018   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   Foot ulcer ([place]) 07/07/2017   DCM (dilated cardiomyopathy) ([place]) 01/20/2017   Depression 05/04/2016   Asthma, chronic 08/23/2015   RBBB    Dyslipidemia    Obstructive sleep apnea 02/08/2014   Coronary atherosclerosis of native coronary artery 02/08/2014   Essential hypertension, benign 02/08/2014   Obesity 02/08/2014    Past Surgical History:  Procedure Laterality Date   CORONARY STENT PLACEMENT     REPLACEMENT TOTAL KNEE     left knee       Family History  Problem Relation Age of Onset   COPD Mother    Pancreatic cancer Father        pancreatic    Other Cousin        Amyloidosis    Social History   Tobacco Use   Smoking status: Never   Smokeless tobacco: Never  Vaping Use   Vaping Use: Never used  Substance Use Topics   Alcohol use: Yes    Alcohol/week: 0.0 standard drinks  Comment: Moderate, beer   Drug use: No    Home Medications Prior to Admission medications   Medication Sig Start Date End Date Taking? Authorizing Provider  cephALEXin (KEFLEX) 500 MG capsule Take 2 capsules (1,000 mg total) by mouth 2 (two) times daily. 11/25/21  Yes Charlesetta Shanks, MD  albuterol (PROVENTIL) (2.5 MG/3ML) 0.083% nebulizer solution Take 3 mLs (2.5 mg total) by nebulization every 6 (six) hours as needed for wheezing or shortness of breath. 01/14/18   Parrett, Fonnie Mu, NP  albuterol (VENTOLIN HFA) 108 (90 Base) MCG/ACT inhaler Inhale 2 puffs into the lungs every 6 (six) hours as needed for wheezing or shortness of breath. 07/16/21   Brand Males, MD  aspirin 81 MG EC tablet Take by mouth.    [provider]  aspirin 81 MG tablet Take 81 mg by mouth daily.    [provider]  atorvastatin (LIPITOR) 20 MG tablet TAKE 1 TABLET BY MOUTH  DAILY  05/22/21   [name], MD  atorvastatin (LIPITOR) 20 MG tablet Take by mouth. 01/20/17   [provider]  Budeson-Glycopyrrol-Formoterol (BREZTRI AEROSPHERE) 160-9-4.8 MCG/ACT AERO Inhale 2 puffs into the lungs in the morning and at bedtime. 06/19/21   Brand Males, MD  CALCIUM PO Take by mouth 2 (two) times daily.     [provider]  Cholecalciferol (VITAMIN D3) 1000 units CAPS Take 1,000 Units by mouth.    [provider]  CO ENZYME Q-10 PO Take by mouth daily.     [provider]  Docusate Sodium (DSS) 100 MG CAPS Take by mouth.    [provider]  escitalopram (LEXAPRO) 20 MG tablet Take 20 mg by mouth daily.    [provider]  esomeprazole (NEXIUM) 40 MG capsule Take 40 mg by mouth daily before breakfast.    [provider]  fenofibrate 160 MG tablet TAKE 1 TABLET BY MOUTH  DAILY WITH FOOD 04/30/21   [name], MD  fenofibrate 160 MG tablet Take by mouth. 05/10/15   [provider]  furosemide (LASIX) 20 MG tablet TAKE 1 TABLET BY MOUTH  EVERY OTHER DAY 04/25/21   [name], MD  LORazepam (ATIVAN) 1 MG tablet Take 1 mg by mouth every 8 (eight) hours.    [provider]  Magnesium 250 MG TABS Take 1 tablet by mouth daily.    [provider]  metoprolol tartrate (LOPRESSOR) 25 MG tablet TAKE 1 TABLET(25 MG) BY MOUTH TWICE DAILY 08/05/21   [name], MD  mirtazapine (REMERON) 15 MG tablet Take 15 mg by mouth at bedtime. 09/30/21   [provider]  Misc Natural Products (COLON CLEANSE PO) Take by mouth.     [provider]  Misc. Devices [place] brace. He has a double upright brace that is no longer enough support for the arthritis and instability of his midfoot and ankle on his right.  It is in lieu of surgical correction. 05/25/19   [provider]  montelukast (SINGULAIR) 10 MG tablet TAKE 1 TABLET(10 MG) BY MOUTH AT BEDTIME 05/07/21   Brand Males, MD   nitroGLYCERIN (NITROSTAT) 0.4 MG SL tablet Place 1 tablet (0.4 mg total) under the tongue every 5 (five) minutes as needed. 08/15/21   [name], MD  Oxycodone HCl 10 MG TABS Take 10 mg by mouth every 6 (six) hours as needed. 10/07/19   [provider]  Spacer/Aero-Holding Chambers (AEROCHAMBER MV) inhaler Use as instructed 01/10/16  Magdalen Spatz, NP  tamsulosin (FLOMAX) 0.4 MG CAPS capsule Take 0.4 mg by mouth daily.  03/09/17   [provider]  Testosterone 12.5 MG/ACT (1%) GEL Apply 1 Squirt topically daily. 10/01/21   Renato Shin, MD  thiamine 100 MG tablet Take by mouth.    [provider]  triamcinolone (NASACORT) 55 MCG/ACT AERO nasal inhaler 2 puffs in each nostril    [provider]  vitamin B-12 (CYANOCOBALAMIN) 1000 MCG tablet Take 1,000 mcg by mouth daily.    [provider]  zolpidem (AMBIEN) 10 MG tablet Take 10 mg by mouth at bedtime as needed for sleep.     [provider]    Allergies    Patient has no known allergies.  Review of Systems   Review of Systems 10 systems reviewed and negative except as per HPI Physical Exam Updated Vital Signs BP 136/60 (BP Location: Left Arm)    Pulse 65    Temp 98.7 F (37.1 C) (Oral)    Resp 14    Ht 5\' 9"  (1.753 m)    Wt 123.4 kg    SpO2 96%    BMI 40.17 kg/m   Physical Exam Constitutional:      Comments: Alert.  Clear mental status.  Physically deconditioned.  No respiratory distress at rest  HENT:     Head: Normocephalic and atraumatic.     Mouth/Throat:     Pharynx: Oropharynx is clear.  Eyes:     Extraocular Movements: Extraocular movements intact.  Neck:     Comments: No midline C-spine tenderness Cardiovascular:     Rate and Rhythm: Normal rate and regular rhythm.  Pulmonary:     Effort: Pulmonary effort is normal.     Breath sounds: Normal breath sounds.  Abdominal:     General: There is no distension.     Palpations: Abdomen is soft.     Tenderness: There  is no abdominal tenderness. There is no guarding.  Musculoskeletal:     Comments: Right lower extremity 2-3+ edema.  Patient describes this is baseline.  There is a skin tear over the knee approximately 5 cm and irregular.  No active bleeding at this time.  Patient can flex at the knee and extend.  Right elbow has a smaller round skin tear about 1-1/2 cm.  No active bleeding.  Range of motion of the elbow is intact.  Skin:    General: Skin is warm and dry.  Neurological:     General: No focal deficit present.     Mental Status: He is oriented to person, place, and time.     Comments: No focal motor weakness.  Patient has chronic lower extremity weakness from degenerative back disease and neuropathy.  Psychiatric:        Mood and Affect: Mood normal.    ED Results / Procedures / Treatments   Labs (all labs ordered are listed, but only abnormal results are displayed) Labs Reviewed - No data to display  EKG None  Radiology DG Elbow Complete Right  Result Date: 11/25/2021 CLINICAL DATA:  Recent fall with right elbow pain, initial encounter EXAM: RIGHT ELBOW - COMPLETE 3+ VIEW COMPARISON:  None. FINDINGS: Degenerative changes are noted about the elbow joint. No joint effusion is seen. No acute fracture or dislocation is noted. IMPRESSION: Degenerative change without acute abnormality. Electronically Signed   By: Inez Catalina M.D.   On: 11/25/2021 00:52   DG Knee Complete 4 Views Right  Result Date:  11/25/2021 CLINICAL DATA:  Recent fall with right knee pain, initial encounter EXAM: RIGHT KNEE - COMPLETE 4+ VIEW COMPARISON:  None. FINDINGS: Right knee prosthesis is noted. No acute fracture or dislocation is noted. No joint effusion is seen. Diffuse vascular calcifications are noted. IMPRESSION: No acute abnormality noted. Electronically Signed   By: Inez Catalina M.D.   On: 11/25/2021 00:53    Procedures Procedures  Wound care: I cleaned the skin tear on the knee with wound cleanse and  normal saline.  Remaining skin approximated over the wound.  Wound not deep enough for suture.  Then Xeroform applied and cloth tape bandage.  Elbow skin tear cleaned and Xeroform dressing applied by med tech Medications Ordered in ED Medications - No data to display  ED Course  I have reviewed the triage vital signs and the nursing notes.  Pertinent labs & imaging results that were available during my care of the patient were reviewed by me and considered in my medical decision making (see chart for details).    MDM Rules/Calculators/A&P                         Patient presents after mechanical fall.  Patient suffers chronically from falls and instability due to peripheral neuropathy and degenerative back disease with significant physical deconditioning.  At this time injuries appear to be limited to right elbow and knee.  X-rays do not show acute findings.  Range of motion is intact.  Patient does have skin tears to both extremities.  plan to clean wounds and dressed with Xeroform dressing.  At this time without report of head injury, headache or any neurologic dysfunction, patient does not appear to need further imaging of the head or C-spine.  No reproducible pain to compression of the chest wall.  No new or different abdominal or back pain from baseline.  Will reassess wounds and anticipate discharge with follow-up.  Patient has a fairly large skin tear over the knee.  This is not amenable to suture repair.  The flap of skin is thin and retracted.  This was elevated and cleaned.  It was realigned and then dressing with Xeroform applied.  Elbow skin tear is minor.  Will give empiric Keflex for deeper skin tear over the knee patient with swelling consistent with chronic venous stasis and pre-existing neuropathy.  He appears to have increased risk for possible secondary infection.  My recommendation is follow-up with PCP within 3 to 7 days.  Instructions have been given for wound care.  Final  Clinical Impression(s) / ED Diagnoses Final diagnoses:  Skin tear of right elbow without complication, initial encounter  Skin tear of lower leg without complication, right, initial encounter  Fall, initial encounter  Contusion of right lower leg, initial encounter    Rx / DC Orders ED Discharge Orders          Ordered    cephALEXin (KEFLEX) 500 MG capsule  2 times daily        11/25/21 1018             Charlesetta Shanks, MD 11/25/21 1021

## 2021-11-25 NOTE — Discharge Instructions (Addendum)
1.  Try to elevate your leg is much as possible.  You may apply a well wrapped ice pack over the knee for about 20 minutes every couple of hours for the next 2 days. 2.  You may change the outer dressing over the Xeroform if it is soiled.  You may leave the Xeroform in place for the next 2 to 3 days.  Then you may gently peel it away and rinse the wound with water and pat dry with a clean cloth.  Reapply the Xeroform and tape the dressing in place cloth or paper tape.  Continue to do this until the wound is healing well, then you can remove it and leave it open to air. 3.  Return to the emergency department if signs of infection such as increasing redness in the surrounding tissues, increasing pain or drainage.  You may normally see some yellowish material sticking to the wound.  This may peel off with the dressing or rinse away.  If it seems like its getting red, painful or material that looks like pus, return to emergency department for recheck. 4.  Schedule a recheck with your doctor within the next 3 to 7 days. 5.  Take Keflex for the next 5 days as prescribed.

## 2021-12-08 ENCOUNTER — Telehealth: Payer: Self-pay | Admitting: Internal Medicine

## 2021-12-08 MED ORDER — ALBUTEROL SULFATE HFA 108 (90 BASE) MCG/ACT IN AERS: 2.0000 | INHALATION_SPRAY | Freq: Four times a day (QID) | RESPIRATORY_TRACT | 6 refills | Status: DC | PRN

## 2021-12-08 NOTE — Addendum Note (Signed)
Addended by: Vivia Ewing on: 12/08/2021 01:05 PM   Modules accepted: Orders

## 2021-12-08 NOTE — Telephone Encounter (Signed)
Call made to patient, confirmed DOB. Patient requesting refill of Albuterol. Confirmed pharmacy. Refill sent.   Nothing further needed at this time.

## 2021-12-16 ENCOUNTER — Telehealth: Payer: Self-pay | Admitting: Internal Medicine

## 2021-12-16 NOTE — Telephone Encounter (Signed)
°  Overall the risk of admission if he gets COVID is low because he has had the vaccine.  However even though he has had the vaccine he can still get the COVID it is just that the admission risk is lower.  If he gets COVID then he would need a course of the antiviral Paxlovid.  He is at risk of getting COVID up to 1 week and sometimes rarely 2 weeks following his exposure.  Best thing to do is to monitor symptoms and also depending on how much money he wants to spend check antigen test frequently.  He could find since check antigen test regularly for the next 1 week at least once a day.  Or maybe every other day.  If he gets symptoms then he should check at least 2 or 3 times within 24 hours.  If it is positive he can call back   Immunization History  Administered Date(s) Administered   DTaP 07/22/2001   Fluad Quad(high Dose 65+) 11/04/2016, 08/26/2020   Influenza Split 09/30/2014   Influenza, High Dose Seasonal PF 11/04/2016, 08/17/2017, 11/03/2018, 09/08/2019   Influenza, Seasonal, Injecte, Preservative Fre 08/30/2017   Influenza,inj,Quad PF,6+ Mos 10/23/2015   Influenza-Unspecified 08/30/2017   Moderna Sars-Covid-2 Vaccination 02/08/2020, 03/09/2020, 11/14/2020   Pneumococcal Conjugate-13 01/02/2015, 10/01/2015   Pneumococcal Polysaccharide-23 04/11/2012   Pneumococcal-Unspecified 04/11/2012   Tdap 12/22/2016   Zoster Recombinat (Shingrix) 07/21/2017, 01/20/2018   Zoster, Live 01/02/2015

## 2021-12-16 NOTE — Telephone Encounter (Signed)
Spoke with pt's wife Jocelyn Lamer (per [facility]) who states she tested positive for Covid on 12/14/21 and is currently taking Paxlovid as directed by her PCP. She is worried that pt will test positive and is requesting proactive steps once/ if does test positive. Pt did test Sunday as well but was negative and is not currently having any s/s. Dr.  Chase Caller please advise.

## 2021-12-16 NOTE — Telephone Encounter (Signed)
Called and spoke with Vickie. She verbalized understanding of recommendations.   Nothing further needed at time of call.

## 2021-12-17 ENCOUNTER — Telehealth: Payer: Self-pay | Admitting: Cardiology

## 2021-12-17 NOTE — Telephone Encounter (Signed)
Patient had video visit with his PCP. Received Molnupiravir. No further action needed

## 2021-12-17 NOTE — Telephone Encounter (Signed)
Pt c/o swelling: STAT is pt has developed SOB within 24 hours  How much weight have you gained and in what time span? Yes (but unsure of how much)  If swelling, where is the swelling located? Both of his legs  Are you currently taking a fluid pill? Yes lasix   Are you currently SOB? no  Do you have a log of your daily weights (if so, list)? no  Have you gained 3 pounds in a day or 5 pounds in a week? unsure  Have you traveled recently? no

## 2021-12-17 NOTE — Telephone Encounter (Signed)
Informed wife of MD recommendations. Wife agreeable and will call PCP.

## 2021-12-17 NOTE — Telephone Encounter (Signed)
Called and spoke with Vickie who states that patient has now tested positive for covid with home test done this morning. would like medicine called in for pt. She also states that his legs are very swollen but they do have them elevated. She is going to call Dr. Radford Pax and get her recommendations.  Pharmacy is [facility] on [address].   Dr. Loanne Drilling please advise as [name] is not available

## 2021-12-17 NOTE — Telephone Encounter (Signed)
Spoke to wife, spent 20 minutes on the phone with her and pt. She reports she is at home with covid, and tested positive this past weekend and has been started on medication, pt came back Covid + today.  States pt is not experiencing any symptoms.  Calling in to report BLEE, states she thinks it is worse.  Stated that it was even swelling on both legs then said it was right leg bigger than left. She says it looks worse that it has ever been, this is a chronic condition. Pt is not weighing himself daily/at all like he has been instructed to do many times. Pt did not show up to his PCP appt this year. Pt is taking his Lasix daily when he feels he needs to. Denies CP, doesn't know if gained weight.  Aware will forward to MD for review advisement. Aware pt will most likely need to be seen before further advisement can be made. Aware Dr. Theodosia Blender nurse will follow up with him on this matter. Patient and wife verbalized understanding and agreeable to plan.

## 2021-12-18 ENCOUNTER — Ambulatory Visit: Payer: Medicare Other | Admitting: Internal Medicine

## 2021-12-19 ENCOUNTER — Other Ambulatory Visit: Payer: Self-pay | Admitting: Internal Medicine

## 2022-01-12 ENCOUNTER — Ambulatory Visit: Payer: Medicare Other | Admitting: Internal Medicine

## 2022-02-02 ENCOUNTER — Telehealth: Payer: Self-pay | Admitting: Internal Medicine

## 2022-02-02 NOTE — Telephone Encounter (Signed)
Yellow-green colored mucus. Hard time breathing. Visit on 02/10/2022. Starts to panic here the past few days. Noted for the past few days. Coughing noted with mucus production. Patient wants to know if he can get another round of prednisone. No fever noted ? ?Please advise Dr Chase Caller  ?

## 2022-02-02 NOTE — Telephone Encounter (Signed)
Im x1 for patient's spouse, Vickie(DPR) ?

## 2022-02-03 MED ORDER — PREDNISONE 10 MG PO TABS: ORAL_TABLET | ORAL | 0 refills | Status: AC

## 2022-02-03 MED ORDER — DOXYCYCLINE HYCLATE 100 MG PO TABS: 100.0000 mg | ORAL_TABLET | Freq: Two times a day (BID) | ORAL | 0 refills | Status: AC

## 2022-02-03 NOTE — Telephone Encounter (Signed)
? ?  Take doxycycline '100mg'$  po twice daily x 5 days; take after meals and avoid sunlight ? ? ?Please take prednisone 40 mg x1 day, then 30 mg x1 day, then 20 mg x1 day, then 10 mg x1 day, and then 5 mg x1 day and stop ? ? ? ?No Known Allergies ? ?

## 2022-02-03 NOTE — Telephone Encounter (Signed)
Called and spoke with patient's wife, Vickie ([place]).  Provided recommendations per [name].  While verifying pharmacy, she stated that they wanted to change to the [facility].  Pharmacy changed and scripts sent.  Advised that if he is not feeling better or getting worse as he finished she scripts to calls [place] back and he may need an in office visit.  She verbalized understanding.  Nothing further needed. ?

## 2022-02-09 ENCOUNTER — Other Ambulatory Visit: Payer: Self-pay | Admitting: Cardiology

## 2022-02-19 ENCOUNTER — Ambulatory Visit: Payer: Medicare Other | Admitting: Internal Medicine

## 2022-03-04 ENCOUNTER — Encounter: Payer: Self-pay | Admitting: Internal Medicine

## 2022-03-04 ENCOUNTER — Ambulatory Visit: Payer: Medicare Other | Admitting: Internal Medicine

## 2022-03-04 ENCOUNTER — Telehealth: Payer: Self-pay | Admitting: Internal Medicine

## 2022-03-04 VITALS — BP 110/62 | HR 79 | Temp 98.3°F | Ht 69.0 in | Wt 286.8 lb

## 2022-03-04 DIAGNOSIS — J4540 Moderate persistent asthma, uncomplicated: Secondary | ICD-10-CM | POA: Diagnosis not present

## 2022-03-04 DIAGNOSIS — G4733 Obstructive sleep apnea (adult) (pediatric): Secondary | ICD-10-CM

## 2022-03-04 DIAGNOSIS — Z9989 Dependence on other enabling machines and devices: Secondary | ICD-10-CM

## 2022-03-04 MED ORDER — TRELEGY ELLIPTA 100-62.5-25 MCG/ACT IN AEPB: 1.0000 | INHALATION_SPRAY | Freq: Every day | RESPIRATORY_TRACT | 0 refills | Status: DC

## 2022-03-04 NOTE — Progress Notes (Signed)
? ? ? ?OV 01/29/2016 ? ?Chief Complaint  ?Patient presents with  ? Follow-up  ?  Pt here after acute visit with SG on 2.10.17. Pt states his breathing has imrpoved since last OV. Pt states he has occassional wheezing and mild non prod cough. Pt denies CP/tightness.   ? ? ?Keith Bernard returns for chronic obstructive lung disease asthma follow-up. He is now on Dulera plus Singulair. He is compliant with Dulera. He says with improved compliance his symptoms are a lot better. His wife is here with him. Most recently he had a flareup and was treated with prednisone. I review the chart when he was seen by [name] gross my nurse practitioner. Currently he feels asthma is well controlled. However he still uses albuterol multiple times a day for rescue but denies any nocturnal symptoms or daytime symptoms. He is compliant with this CPAP for sleep apnea. ? ?Review of the chart shows this is in December 2012 has elevated eos 400 cells per cubic millimeter. I notice that and IgE was never checked. In any event subjectively is well controlled currently. Also noted on alpha-1 has not been checked. ? ?Asthma control questionnaire shows that at night he hardly ever wakes up because of asthma. When he wakes up he has very mild symptoms. In the daytime he is moderately limited because of asthma although this could be because of obesity. He also has moderate shortness of breath because of asthma although this could be because of obesity. He is wheezing a lot of the time and uses albuterol 3-4 puffs on most days. Nevertheless average score of 2.2 show significant improvement after starting Dulera ? ?OV 05/04/2016 ? ?Chief Complaint  ?Patient presents with  ? Follow-up  ?  Pt c/o occasional wheeze since not being on Breo for about a week. Pt has been using albuterol HFA more. Pt denies cough/SOB/CP/tightness.   ? ?Follow-up asthma. Last seen March 2017. This is a routine follow-up. I put him on Breo when she really liked. However the  cost dollars out of pocket because he is in the donut hole. So he ran out of it over a week ago and is having increased symptoms. Asthma control questionnaire is 2.0 and still within his range from prior visits. Exhaled nitric oxide is just over 25 suggests some amount of active airway inflammation. His wife is here with him and she says that she got approved for the [facility] patient assistance program but apparently between our office in the mailroom the paperwork has been loss. She is willing to redo the paperwork and she is wondering if I could sign my part of it this week. ? ?In terms of symptoms specifically tells me that he does wake up a few times at night. When he wakes up he has very mild symptoms. Slightly limited in his activities because of asthma. He has moderate amount of shortness of breath with exertion. He does wheeze a little of the time and is using albuterol for rescue at least one or 2 times daily. 5 point score is 2/.0 showing activity - is worse this past week or so is within normal ? ? ?OV 01/04/2017 ? ? ?Chief Complaint  ?Patient presents with  ? Follow-up  ?  Pt states last week he had an 'asthma attack' and went to ED in [place] and was given steroid and abx per pt's wife. Pt states he feels he is improving. Pt states he does not feel back to baseline. Pt denies CP/tightness  and f/c/s.   ? ? ?Follow-up moderate persistent asthma ? ?Last seen June 2017. He presents with his wife as always. He is on Dulera through the assistance program. Some 3 weeks ago his sister died from terminal cancer. Take a flight to [place]. His been dealing with those emotions. Because of the travel, emotions and weather change some 10 days ago he had an asthma exacerbation but never below few days with increased cough and wheezing. He went to a local emergency department and got one shot of steroids and was given 7 days of cephalexin which is completed. Despite this is not fully better. He still has some cough  and wheeze and yellow sputum. There is no fever. ? ? ?OV 07/07/2017 ? ?Chief Complaint  ?Patient presents with  ? Follow-up  ?  Pt using Dulera, still having trouble getting this covered by insurance and [employer]. Pt states that his breathing is doing better after senc round of Abx/Prednisone.   ? ? ? ?Follow-up moderate persistent asthma ? ? ?Last seen February 2018. He presents with his wife. Overall he is doing stable except with the summer heat his symptoms are slightly more than usual as seen in the asthma control questionnaire. He says that he does not wake up at night because of asthma. When he wakes up he has mild symptoms. He has limitation in his activities because of asthma but this could also be because of obesity and DJD and spine issues. He does get dyspneic for moderate amount of the time and is wheezing a lot of the time but using albuterol for rescue 1-2 puffs per day. He's having significant difficulty getting [employer] to pay for his assistance program and is in need of samples today. He continues on Singulair. ? ? ?OV 12/09/2017 ? ?Chief Complaint  ?Patient presents with  ? Follow-up  ?  shortness of breath   ?Follow-up moderate persistent asthma ? ?Routine 67-monthfollow-up.  He presents with his wife.  Overall he is doing fine.  He has significant symptoms on his asthma control questionnaire.  In fact his symptoms are slowly deteriorated.  Average score is 2.8.  But this resolved mostly shortness of breath.  He feels is due to obesity.  His nitric oxide is 12 showing good asthma control on Dulera and Singulair.  He plans to lose weight.  Recently had a fall and hurt the shoulder.  Review of the images show is not had a chest x-ray since 2010.  He is up-to-date with his flu shot.  In terms of his asthma control questionnaire he hardly ever wakes up at night.  When he wakes up he has moderate symptoms.  He is limited with his activities moderately and does express  a moderate amount of shortness of breath but he does wheeze occasionally.  He is using albuterol for rescue multiple times a day. ? ?= ? ?OV 08/05/2018 ? ?Subjective:  ?Patient ID: Keith Bernard , DOB: [birth year], age 76y.o. , MRN: [id number], [place] [id number]? ? ?08/05/2018 -   ?Chief Complaint  ?Patient presents with  ? Follow-up  ?  Pt has been sick and was put on prednisone and abx which he began Monday, 9/2.  Pt has c/o cough with green mucus, increased SOB. Denies any CP or chest tightness.  ? ? ? ?HPI ?MLupita Leash89y.o. -moderate persistent asthma patient here for follow-up. He is here with  his wife. They tell me that has been many months since he has taken his inhale corticosteroid Dulera because the patient assistance program has been slow to respond.He is relying on sampples. Apparently got some Symbicort from usat some point in the past since he last saw me. However he has run out of that as well. He is not sure how long it is since he ran out of that. So he is having a flareup. He is just dependent on prednisone for the last 5 days with antibiotic symptoms by our nurse practitioner.He does not feel fully back to baseline. His asthma control question is 3.2 showing significant nocturnal symptoms or albuterol rescue use OF breath and wheezing. In fact on exam he had some wheezing is exhaled nitric oxide was 28 while on prednisone ? ? ? ? ? ? ?OV 11/11/2018 ? ?Subjective:  ?Patient ID: Keith Bernard, Bernard , DOB: 1946-09-07 , age 76 y.o. , MRN: [id number] , AD[address]ESS: [address] [place] [address] ? ? ?11/11/2018 -   ?Chief Complaint  ?Patient presents with  ? Follow-up  ?  f/u asthma. Pt states he has had a recent flare up, wheezing has increased.  ? ?Moderate persistent asthma for follow-up.  He is on [place] and Singulair ? ?HPI ?Keith Bernard 76 y.o. -for the last 3 days he is reporting increased cough with congestion and some mild increase  in wheezing with green sputum.  Asthma control questionnaire shows that he is waking up a few times at night.  When he wakes up he is quite severe symptoms he is very limited in his activities because o

## 2022-03-04 NOTE — Patient Instructions (Addendum)
Moderate persistent asthma without complication with elevated IgE and blood eps ? ? ? ?- clinically well controlled  ?-  wixela working well but forgetting spiriva  ?- appreciate interest in triple inhaler therapy othe than BREZTRI which cuased hoarsenesss ? ?Plan ?- try trelegy 1 puff daily scheduled (during this time hold wixela and spiriva) ?- if working well call in for prescription ? ? ?OSA on CPAP ? ?- cotninue cpap through sleep doc ? ?QUantiferon GOld positive ? ?Plan ? -ddi you go to public health - if not please do ? 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 ?- still persistent aug 2022 - similar to  1 year ago ? ?Plan ?-do repeat HRCT in 6 months ? ? ?Follow-up ?-6 months but after HRCT; sooner if needed ?

## 2022-03-04 NOTE — Telephone Encounter (Signed)
EMily ? ? ?Did SAAHAS HIDROGO tell you 03/04/2022 ? if he went to [place] regarding Positive Quant Gold? ? ?Thanks ? ? ? ?SIGNATURE  ? ? ?Dr. Brand Males, M.D., F.C.C.P,  ?Pulmonary and Critical Care Medicine ?Staff Physician, [place] ?Center Director - Interstitial Lung Disease  Program  ?P[facility] [place] [facility] ?[place], [place], [address] ? ?NPI Number:  NPI #[id number] ?DEA Number: [id number] ? ?Pager: [phone], If no answer  -> Check AMION or Try [phone] ?Telephone (clinical office): [phone] ?Telephone (research): [phone] ? ?9:59 PM ?03/04/2022 ? ?

## 2022-03-05 NOTE — Telephone Encounter (Signed)
Ok he needs to go ?

## 2022-03-05 NOTE — Telephone Encounter (Signed)
I did ask pt if he ever went to health dept after he had the positive quantiferon gold 08/2021 and pt said that he never went. ?

## 2022-03-09 ENCOUNTER — Telehealth: Payer: Self-pay | Admitting: Internal Medicine

## 2022-03-09 MED ORDER — PREDNISONE 10 MG PO TABS: ORAL_TABLET | ORAL | 0 refills | Status: AC

## 2022-03-09 NOTE — Telephone Encounter (Signed)
With the wheezing and chest tightness, please send prednisone taper. 4 tabs for 2 days, then 3 tabs for 2 days, 2 tabs for 2 days, then 1 tab for 2 days, then stop. Take in AM with food. Use albuterol PRN for shortness of breath, wheezing or chest tightness. If no improvement or worsening in symptoms, needs OV. Thanks!

## 2022-03-09 NOTE — Telephone Encounter (Signed)
Pt's positive quantiferon gold test results has been printed as well as pt's cxr results. Also printed pt's last OV with MR. This has been faxed to [facility]. ?

## 2022-03-09 NOTE — Telephone Encounter (Signed)
Called and spoke with patient. He verbalized understanding. ? ?Nothing further needed at time of call.  ?

## 2022-03-09 NOTE — Telephone Encounter (Signed)
Does he have any URI symptoms (nasal congestion, drainage, etc) or wheezing? I would refrain at this point from abx as it is likely allergic or viral in nature given it has only been a day or two and his breathing is stable. Continue mucinex DM Twice daily. Can send Tessalon Perles 200 mg Three times a day as needed for cough. Flonase nasal spray 2 sprays each nostril daily and saline nasal irrigations 1-2 times per day to target postnasal drainage. OV if symptoms do not improve or worsen. Thanks.

## 2022-03-09 NOTE — Telephone Encounter (Signed)
[facility] Dept and was transferred to [facility] to try to arrange an appt for pt to be seen due to having the positive Quantiferon gold test. Unable to reach. Left message for Anderson Malta to return call. ?

## 2022-03-09 NOTE — Telephone Encounter (Signed)
Called and spoke with patient and his wife. He stated that he started wheezing this morning. His chest feels tight as well. Denied any nasal congestion. His wife stated that he already has some Tessalon perles on hand. He will continue to take the Mucinex twice daily.  ?

## 2022-03-09 NOTE — Telephone Encounter (Signed)
Called and spoke with both patient and his wife. Patient was last seen by MR on 03/04/22 for an asthma follow up. He stated that he felt completely fine for the visit. Over the weekend he started getting sick. He has a productive cough with thick green phlegm. Denied any increased SOB, fever or chest pain. He has been using the Mucinex DM twice daily.  ? ?He stated that when this happens, MR usually sends an abx and prednisone taper. He is aware that MR is not here today.  ? ?Pharmacy is [facility] in [place].  ? ?Joellen Jersey, can you please advise? Thanks!  ?

## 2022-03-29 ENCOUNTER — Other Ambulatory Visit: Payer: Self-pay | Admitting: Cardiology

## 2022-04-23 ENCOUNTER — Other Ambulatory Visit: Payer: Self-pay | Admitting: Cardiology

## 2022-04-29 ENCOUNTER — Other Ambulatory Visit: Payer: Self-pay | Admitting: Internal Medicine

## 2022-06-11 ENCOUNTER — Ambulatory Visit: Payer: Medicare Other | Admitting: Nurse Practitioner

## 2022-06-15 ENCOUNTER — Telehealth: Payer: Self-pay | Admitting: Internal Medicine

## 2022-06-15 NOTE — Telephone Encounter (Signed)
Called and spoke with both pt and spouse Vickie. Vickie states that pt's breathing has become worse since last visit and states that pt has been having a lot of panic attacks.   She states that pt rarely goes out due to saying that he is unable to breathe. States that pt had gone to the store the other day and when leaving the store, pt had an episode where he was unable to breathe and stated that he took one of his lorazepam to see if that would help and said when he got home from the store, he did a breathing treatment.  Pt said that he has been using  his albuterol inhaler or doing a neb treatment at least 4-5 times daily recently. Pt has also been overusing his Wixela inhaler due to having problems.  Pt has had complaints of chest tightness, has been feeling congested, coughing getting up yellow phlegm, and also has been wheezing.   Pt does have an upcoming appt scheduled 7/19 with Katie but he is requesting meds to be prescribed now to hold him over until OV. Katie, please advise.

## 2022-06-15 NOTE — Telephone Encounter (Signed)
Called and spoke with pt letting him know recs per Joellen Jersey and he verbalized understanding. Nothing further needed.

## 2022-06-15 NOTE — Telephone Encounter (Signed)
I would recommend he go to the ED for further evaluation given his symptoms are that severe and he feels as though he can't wait for his OV. He needs imaging of his chest and further in person evaluation. Thanks.

## 2022-06-17 ENCOUNTER — Ambulatory Visit (INDEPENDENT_AMBULATORY_CARE_PROVIDER_SITE_OTHER): Payer: Medicare Other

## 2022-06-17 ENCOUNTER — Ambulatory Visit: Payer: Medicare Other | Admitting: Nurse Practitioner

## 2022-06-17 ENCOUNTER — Encounter: Payer: Self-pay | Admitting: Nurse Practitioner

## 2022-06-17 VITALS — BP 118/70 | HR 77 | Temp 98.4°F | Ht 67.0 in | Wt 291.2 lb

## 2022-06-17 DIAGNOSIS — G4733 Obstructive sleep apnea (adult) (pediatric): Secondary | ICD-10-CM

## 2022-06-17 DIAGNOSIS — I5032 Chronic diastolic (congestive) heart failure: Secondary | ICD-10-CM

## 2022-06-17 DIAGNOSIS — J4551 Severe persistent asthma with (acute) exacerbation: Secondary | ICD-10-CM | POA: Diagnosis not present

## 2022-06-17 DIAGNOSIS — R0609 Other forms of dyspnea: Secondary | ICD-10-CM | POA: Diagnosis not present

## 2022-06-17 DIAGNOSIS — J4550 Severe persistent asthma, uncomplicated: Secondary | ICD-10-CM

## 2022-06-17 DIAGNOSIS — J9601 Acute respiratory failure with hypoxia: Secondary | ICD-10-CM | POA: Diagnosis not present

## 2022-06-17 DIAGNOSIS — R0602 Shortness of breath: Secondary | ICD-10-CM

## 2022-06-17 LAB — BASIC METABOLIC PANEL
BUN: 18 mg/dL (ref 6–23)
CO2: 30 mEq/L (ref 19–32)
Calcium: 9.2 mg/dL (ref 8.4–10.5)
Chloride: 98 mEq/L (ref 96–112)
Creatinine, Ser: 1.16 mg/dL (ref 0.40–1.50)
GFR: 61.08 mL/min (ref >60.00)
Glucose, Bld: 100 mg/dL — ABNORMAL HIGH (ref 70–99)
Potassium: 4.3 mEq/L (ref 3.5–5.1)
Sodium: 135 mEq/L (ref 135–145)

## 2022-06-17 LAB — CBC WITH DIFFERENTIAL/PLATELET
Basophils Absolute: 0.1 10*3/uL (ref 0.0–0.1)
Basophils Relative: 0.9 % (ref 0.0–3.0)
Eosinophils Absolute: 0.2 10*3/uL (ref 0.0–0.7)
Eosinophils Relative: 2.5 % (ref 0.0–5.0)
HCT: 37.4 % — ABNORMAL LOW (ref 39.0–52.0)
Hemoglobin: 12.1 g/dL — ABNORMAL LOW (ref 13.0–17.0)
Lymphocytes Relative: 19.6 % (ref 12.0–46.0)
Lymphs Abs: 1.6 10*3/uL (ref 0.7–4.0)
MCHC: 32.4 g/dL (ref 30.0–36.0)
MCV: 92.7 fl (ref 78.0–100.0)
Monocytes Absolute: 0.8 10*3/uL (ref 0.1–1.0)
Monocytes Relative: 9.7 % (ref 3.0–12.0)
Neutro Abs: 5.4 10*3/uL (ref 1.4–7.7)
Neutrophils Relative %: 67.3 % (ref 43.0–77.0)
Platelets: 234 10*3/uL (ref 150.0–400.0)
RBC: 4.03 Mil/uL — ABNORMAL LOW (ref 4.22–5.81)
RDW: 14.4 % (ref 11.5–15.5)
WBC: 8 10*3/uL (ref 4.0–10.5)

## 2022-06-17 LAB — POCT EXHALED NITRIC OXIDE: FeNO level (ppb): 19

## 2022-06-17 LAB — D-DIMER, QUANTITATIVE: D-Dimer, Quant: 1.64 mcg/mL FEU — ABNORMAL HIGH (ref <0.50)

## 2022-06-17 LAB — BRAIN NATRIURETIC PEPTIDE: Pro B Natriuretic peptide (BNP): 236 pg/mL — ABNORMAL HIGH (ref 0.0–100.0)

## 2022-06-17 MED ORDER — PREDNISONE 20 MG PO TABS: 40.0000 mg | ORAL_TABLET | Freq: Every day | ORAL | 0 refills | Status: DC

## 2022-06-17 MED ORDER — TRELEGY ELLIPTA 200-62.5-25 MCG/ACT IN AEPB: 1.0000 | INHALATION_SPRAY | Freq: Every day | RESPIRATORY_TRACT | 5 refills | Status: DC

## 2022-06-17 MED ORDER — TRELEGY ELLIPTA 200-62.5-25 MCG/ACT IN AEPB: 1.0000 | INHALATION_SPRAY | Freq: Every day | RESPIRATORY_TRACT | 0 refills | Status: DC

## 2022-06-17 MED ORDER — DOXYCYCLINE HYCLATE 100 MG PO TABS: 100.0000 mg | ORAL_TABLET | Freq: Two times a day (BID) | ORAL | 0 refills | Status: AC

## 2022-06-17 MED ORDER — PREDNISONE 10 MG PO TABS: ORAL_TABLET | ORAL | 0 refills | Status: DC

## 2022-06-17 NOTE — Patient Instructions (Addendum)
Stop Wixela. Start Trelegy 1 puff daily. Brush tongue and rinse mouth afterwards.  Continue Albuterol inhaler 2 puffs or 3 mL neb every 6 hours as needed for shortness of breath or wheezing. Notify if symptoms persist despite rescue inhaler/neb use.  Continue nexium 40 mg daily for reflux Continue singulair 10 mg At bedtime  Start supplemental oxygen 2 lpm with activity. Monitor oxygen for goal >88-90%. If you are below this, despite O2 therapy, please seek emergency care.   Prednisone taper. 4 tabs for 2 days, then 3 tabs for 2 days, 2 tabs for 2 days, then 1 tab for 2 days, then stop. Take in am with food.  Doxycycline 1 tab Twice daily for 7 days. Take with food. Wear sunscreen when outside  Mucinex 600 mg Twice daily for chest congestion.   Chest x ray and labs today  Follow up in one week with [name] or Alanson Aly. If symptoms do not improve or worsen, please contact office for sooner follow up or seek emergency care.

## 2022-06-17 NOTE — Assessment & Plan Note (Addendum)
I am not entirely convinced this is related to an asthma exacerbation or if this is chronic. He has had progressive dyspnea over the last few months that seems out of proportion to his asthma. No evidence of superimposed infection on CXR today. We are going to check d dimer, CBC with diff, BMET and BNP today. If labs are unremarkable and no improvement with prednisone and abx, he will need CT chest, echocardiogram, and repeat PFTs. Advised him to closely monitor his oxygen levels and provided with strict ED precautions.

## 2022-06-17 NOTE — Assessment & Plan Note (Signed)
Appears compensated on exam today. He has had some intermittent leg swelling. Given this, his DOE and new O2 requirement, we will check BNP and BMET today.

## 2022-06-17 NOTE — Progress Notes (Signed)
$'@Patient'V$  ID: Keith Bernard, male    DOB: 08/23/1946, 76 y.o.   MRN: [id number]  Chief Complaint  Patient presents with   Follow-up    Patient says he's been having asthma attacks really bad. Asthma attacks have been happening once every other day.     Referring provider: [name], MD  HPI: 76 year old male, never smoker followed for asthma.  He is a patient of Dr. Golden Pop and last seen in office 03/04/2022.  Past medical history significant for CAD, hypertension, RBBB, DCM, CHF, OSA on CPAP, HLD, obesity, depression.  TEST/EVENTS:  06/19/2020 PFTs: FVC 62, FEV1 64, ratio 79, TLC 71, DLCOcor 86.  Moderate obstruction with reversibility  07/30/2021 HRCT chest: Atherosclerosis.  No LAD.  There are some areas of very mild groundglass attenuation and septal thickening in the periphery of the right lower lobe.  Stable when compared to previous and indeterminate for UIP.  Most likely postinfectious or inflammatory fibrosis.  Inspiratory and expiratory imaging demonstrate some mild air trapping.  There is chronic elevation of the left hemidiaphragm again noted. 09/26/2021 echocardiogram: EF 60 to 65%.  G1 DD.  Elevated left atrial pressure.  RV size and function is normal.  LA is mildly dilated.  Trivial MR.  09/2021: [name] with [facility]ce NP.  He had complaints of worsening dyspnea upon exertion.  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  He was referred to public health department to see if he needed treatment.  Very deconditioned and has chronic anemia, both of which are impacting his dyspnea.  Has had some issues with inhalers causing hoarseness but was doing well on Wixela and Spiriva.  Discussed the role of Biologics in the future.   03/04/2022: OV with [name].  Reported that he was currently forgetting Spiriva and just doing Wixela.  Wife requested change to Trelegy due to simplicity and wanting triple  regimen.  Otherwise well and not having any acute symptoms.  06/17/2022: Today - acute visit Patient presents today for worsening shortness of breath over the past three weeks.  He called in the office earlier this week reporting that he had been using his albuterol inhaler 4-6 times a day.  He was having severe anxiety because of the shortness of breath and did not even want to leave the house.  He was not sure he could even wait until his office visit.  He was recommended to go to the emergency room.  Today, he reports that he ended up not going.  He still is having increased shortness of breath when compared to his baseline, which has been going on for the past 3 weeks; however, upon further discussion, appears that his dyspnea has been progressively declining over the last few months.  He also has a productive cough that started around 3 weeks ago as well.  He has been having green sputum production.  He has been wheezing.  He does get some relief with albuterol use.  He is concerned that he may need oxygen.  He does occasionally have some lower extremity edema.  He was previously told by cardiology that this is related to his neuropathy.  He has some feelings of generalized weakness and feels like he is not able to do activities as he used to.  He denies any fevers, chills, hemoptysis, chest pain, palpitations, anorexia, weight loss.  No PND or orthopnea.  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  He never followed up with the health department after his last visit.  He is currently using Wixela for maintenance; had some issues with getting Trelegy previously.  He would like to see if he could go back on this.  He also takes Singulair at bedtime for trigger prevention.  No Known Allergies  Immunization History  Administered Date(s) Administered   DTaP 07/22/2001   Fluad Quad(high Dose 65+) 11/04/2016,  08/26/2020   Influenza Split 09/30/2014   Influenza, High Dose Seasonal PF 11/04/2016, 08/17/2017, 11/03/2018, 09/08/2019   Influenza, Seasonal, Injecte, Preservative Fre 08/30/2017   Influenza,inj,Quad PF,6+ Mos 10/23/2015   Influenza-Unspecified 08/31/2007, 10/12/2013, 09/30/2014, 10/18/2015, 10/23/2015, 08/30/2017   Moderna Sars-Covid-2 Vaccination 02/08/2020, 03/09/2020, 11/14/2020   Pneumococcal Conjugate-13 01/02/2015, 10/01/2015   Pneumococcal Polysaccharide-23 04/11/2012   Pneumococcal-Unspecified 04/11/2012   Tdap 12/22/2016   Zoster Recombinat (Shingrix) 07/21/2017, 01/20/2018   Zoster, Live 01/02/2015    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 EF 35% but now by echo ER 55% 04/2009   Depression 05/04/2016   DJD (degenerative joint disease)    Dyslipidemia    ETOH abuse    Foot ulcer ([place]) 07/07/2017   GERD (gastroesophageal reflux disease)    Hypertension    Neuropathy    Obesity    OSA (obstructive sleep apnea)    Moderate OSA w AHI 17.7/hr now on VPAP at EEP at 7cm H2O, min PS 3cm H2O,max PS 15cm H2O   RBBB     Tobacco History: Social History   Tobacco Use  Smoking Status Never  Smokeless Tobacco Never   Counseling given: Not Answered   Outpatient Medications Prior to Visit  Medication Sig Dispense Refill   albuterol (PROVENTIL) (2.5 MG/3ML) 0.083% nebulizer solution Take 3 mLs (2.5 mg total) by nebulization every 6 (six) hours as needed for wheezing or shortness of breath. 75 mL 5   albuterol (VENTOLIN HFA) 108 (90 Base) MCG/ACT inhaler INHALE 2 PUFFS INTO THE LUNGS EVERY 6 HOURS AS NEEDED FOR WHEEZING OR SHORTNESS OF BREATH 18 g 6   aspirin 81 MG tablet Take  81 mg by mouth daily.     atorvastatin (LIPITOR) 20 MG tablet TAKE 1 TABLET BY MOUTH  DAILY 90 tablet 1   CALCIUM PO Take by mouth 2 (two) times daily.      Cholecalciferol (VITAMIN D3) 1000 units CAPS Take 1,000 Units by mouth.     CO ENZYME Q-10 PO Take by mouth daily.      Docusate Sodium (DSS) 100 MG CAPS Take by mouth.     escitalopram (LEXAPRO) 20 MG tablet Take 20 mg by mouth daily.     esomeprazole (NEXIUM) 40 MG capsule Take 40 mg by mouth daily before breakfast.     fenofibrate 160 MG tablet TAKE 1 TABLET BY MOUTH  DAILY WITH FOOD 90 tablet 1   furosemide (LASIX) 20 MG tablet TAKE 1 TABLET BY MOUTH  EVERY OTHER DAY 45 tablet 1   LORazepam (ATIVAN)  1 MG tablet Take 1 mg by mouth every 8 (eight) hours.     Magnesium 500 MG TABS Take 1 tablet by mouth daily.     metoprolol tartrate (LOPRESSOR) 25 MG tablet TAKE 1 TABLET BY MOUTH  TWICE DAILY 180 tablet 3   mirtazapine (REMERON) 15 MG tablet Take 15 mg by mouth at bedtime.     Misc Natural Products (COLON CLEANSE PO) Take by mouth.      Misc. Devices Cornell brace. He has a double upright brace that is no longer enough support for the arthritis and instability of his midfoot and ankle on his right.  It is in lieu of surgical correction.     montelukast (SINGULAIR) 10 MG tablet TAKE 1 TABLET BY MOUTH AT  BEDTIME 90 tablet 3   nitroGLYCERIN (NITROSTAT) 0.4 MG SL tablet Place 1 tablet (0.4 mg total) under the tongue every 5 (five) minutes as needed. 25 tablet 3   oxyCODONE HCl 7.5 MG TABS Take 7.5 mg by mouth every 6 (six) hours as needed.     Spacer/Aero-Holding Chambers (AEROCHAMBER MV) inhaler Use as instructed 1 each 0   tamsulosin (FLOMAX) 0.4 MG CAPS capsule Take 0.4 mg by mouth daily.      Testosterone 12.5 MG/ACT (1%) GEL Apply 1 Squirt topically daily. 75 g 0   thiamine 100 MG tablet Take by mouth.     triamcinolone (NASACORT) 55 MCG/ACT AERO nasal inhaler 2 puffs in each nostril     vitamin B-12 (CYANOCOBALAMIN) 1000 MCG  tablet Take 1,000 mcg by mouth daily.     zolpidem (AMBIEN) 10 MG tablet Take 10 mg by mouth at bedtime as needed for sleep.      Fluticasone-Umeclidin-Vilant (TRELEGY ELLIPTA) 100-62.5-25 MCG/ACT AEPB Inhale 1 puff into the lungs daily. 14 each 0   No facility-administered medications prior to visit.     Review of Systems:   Constitutional: No weight loss or gain, night sweats, fevers, chills. +fatigue, lassitude. HEENT: No headaches, difficulty swallowing, tooth/dental problems, or sore throat. No sneezing, itching, ear ache, nasal congestion, or post nasal drip CV:  +swelling in lower extremities. No chest pain, orthopnea, PND, anasarca, dizziness, palpitations, syncope Resp: +shortness of breath with exertion; productive cough; wheezing. No hemoptysis. No chest wall deformity GI:  No heartburn, indigestion, abdominal pain, nausea, vomiting, diarrhea, change in bowel habits, loss of appetite, bloody stools.  GU: No dysuria, change in color of urine, urgency or frequency.  No flank pain, no hematuria  Skin: No rash, lesions, ulcerations MSK:  No joint pain or swelling.  No decreased range of motion.  No back pain. Neuro: No dizziness or lightheadedness.  Psych: No depression or anxiety. Mood stable.     Physical Exam:  BP 118/70 (BP Location: Right Arm, Patient Position: Sitting, Cuff Size: Normal)   Pulse 77   Temp 98.4 F (36.9 C) (Oral)   Ht '5\' 7"'$  (1.702 m)   Wt 291 lb 3.2 oz (132.1 kg)   SpO2 92%   BMI 45.61 kg/m   GEN: Pleasant, interactive, chronically-ill appearing; morbidly obese; in no acute distress. HEENT:  Normocephalic and atraumatic. PERRLA. Sclera white. Nasal turbinates pink, moist and patent bilaterally. No rhinorrhea present. Oropharynx pink and moist, without exudate or edema. No lesions, ulcerations, or postnasal drip.  NECK:  Supple w/ fair ROM. No JVD present. Normal carotid impulses w/o bruits. Thyroid symmetrical with no goiter or nodules palpated. No  lymphadenopathy.   CV: RRR, no m/r/g, no peripheral edema.  Pulses intact, +2 bilaterally. No cyanosis, pallor or clubbing. PULMONARY:  Unlabored, regular breathing.  End expiratory wheezes bilaterally posteriorly.  No accessory muscle use. No dullness to percussion. GI: BS present and normoactive. Soft, non-tender to palpation. No organomegaly or masses detected. No CVA tenderness. MSK: No erythema, warmth or tenderness. Cap refil <2 sec all extrem. No deformities or joint swelling noted.  Neuro: A/Ox3. No focal deficits noted.   Skin: Warm, no lesions or rashe Psych: Normal affect and behavior. Judgement and thought content appropriate.     Lab Results:  CBC    Component Value Date/Time   WBC 7.8 09/19/2021 1508   RBC 3.77 (L) 09/19/2021 1508   HGB 11.6 (L) 09/19/2021 1508   HGB 12.7 (L) 07/05/2018 1135   HGB 14.1 10/28/2017 1602   HCT 35.7 (L) 09/19/2021 1508   HCT 43.8 10/28/2017 1602   PLT 214.0 09/19/2021 1508   PLT 178 07/05/2018 1135   PLT 201 10/28/2017 1602   MCV 94.8 09/19/2021 1508   MCV 102.6 (H) 10/28/2017 1602   MCH 30.9 01/10/2021 1233   MCHC 32.5 09/19/2021 1508   RDW 14.5 09/19/2021 1508   RDW 13.5 10/28/2017 1602   LYMPHSABS 1.2 09/19/2021 1508   LYMPHSABS 1.4 10/28/2017 1602   MONOABS 0.6 09/19/2021 1508   MONOABS 1.0 (H) 10/28/2017 1602   EOSABS 0.1 09/19/2021 1508   EOSABS 0.1 10/28/2017 1602   BASOSABS 0.1 09/19/2021 1508   BASOSABS 0.0 10/28/2017 1602    BMET    Component Value Date/Time   NA 135 06/17/2022 1644   NA 140 09/26/2021 0945   NA 139 10/28/2017 1602   K 4.3 06/17/2022 1644   K 4.3 10/28/2017 1602   CL 98 06/17/2022 1644   CO2 30 06/17/2022 1644   CO2 26 10/28/2017 1602   GLUCOSE 100 (H) 06/17/2022 1644   GLUCOSE 99 10/28/2017 1602   BUN 18 06/17/2022 1644   BUN 8 09/26/2021 0945   BUN 16.3 10/28/2017 1602   CREATININE 1.16 06/17/2022 1644   CREATININE 0.91 01/10/2021 1233   CREATININE 1.0 10/28/2017 1602   CALCIUM 9.2  06/17/2022 1644   CALCIUM 9.3 10/28/2017 1602   GFRNONAA >60 01/10/2021 1233   GFRAA >60 01/12/2020 1210    BNP No results found for: "BNP"   Imaging:  DG Chest 2 View  Result Date: 06/17/2022 CLINICAL DATA:  Shortness of breath. EXAM: CHEST - 2 VIEW COMPARISON:  10/15/2021 FINDINGS: The cardiac silhouette, mediastinal and hilar contours are stable. Chronic bronchitic type lung changes but no acute overlying pulmonary process. Stable moderate eventration of the left hemidiaphragm. Spinal cord stimulator is noted. The bony structures are intact. IMPRESSION: Chronic lung changes but no acute pulmonary findings. Electronically Signed   By: Marijo Sanes M.D.   On: 06/17/2022 16:40         Latest Ref Rng & Units 06/19/2020   12:56 PM 10/17/2015    9:53 AM  PFT Results  FVC-Pre L 2.44  2.22   FVC-Predicted Pre % 62  54   FVC-Post L 2.58  2.34   FVC-Predicted Post % 65  57   Pre FEV1/FVC % % 75  75   Post FEV1/FCV % % 79  80   FEV1-Pre L 1.85  1.66   FEV1-Predicted Pre % 64  55   FEV1-Post L 2.04  1.88   DLCO uncorrected ml/min/mmHg 18.83  19.62   DLCO UNC% % 79  66   DLCO corrected ml/min/mmHg 20.59  DLCO COR %Predicted % 86    DLVA Predicted % 123  121   TLC L 4.78  4.62   TLC % Predicted % 71  69   RV % Predicted % 101  98     Lab Results  Component Value Date   NITRICOXIDE 11 11/11/2018        Assessment & Plan:   Severe asthma with acute exacerbation Progressive decline in respiratory status over the last few months with increased DOE.  Over the past 3 weeks, he has had worsening shortness of breath and a productive cough. FeNO was normal today; however, CXR shows bronchitic changes and he has bronchospasm on exam. We will treat him for acute exacerbation of his asthma with acute bronchitis with prednisone taper and doxycycline course.  Step up to Trelegy 200 to achieve triple therapy.  Continue albuterol as needed.  Advised that he uses neb at least twice daily  until symptoms improve.  Check eosinophils today - may consider biologic therapy if significantly elevated.   Patient Instructions  Stop Wixela. Start Trelegy 1 puff daily. Brush tongue and rinse mouth afterwards.  Continue Albuterol inhaler 2 puffs or 3 mL neb every 6 hours as needed for shortness of breath or wheezing. Notify if symptoms persist despite rescue inhaler/neb use.  Continue nexium 40 mg daily for reflux Continue singulair 10 mg At bedtime  Start supplemental oxygen 2 lpm with activity. Monitor oxygen for goal >88-90%. If you are below this, despite O2 therapy, please seek emergency care.   Prednisone taper. 4 tabs for 2 days, then 3 tabs for 2 days, 2 tabs for 2 days, then 1 tab for 2 days, then stop. Take in am with food.  Doxycycline 1 tab Twice daily for 7 days. Take with food. Wear sunscreen when outside  Mucinex 600 mg Twice daily for chest congestion.   Chest x ray and labs today  Follow up in one week with [name] or Alanson Aly. If symptoms do not improve or worsen, please contact office for sooner follow up or seek emergency care.     Acute respiratory failure with hypoxia (HCC) I am not entirely convinced this is related to an asthma exacerbation or if this is chronic. He has had progressive dyspnea over the last few months that seems out of proportion to his asthma. No evidence of superimposed infection on CXR today. We are going to check d dimer, CBC with diff, BMET and BNP today. If labs are unremarkable and no improvement with prednisone and abx, he will need CT chest, echocardiogram, and repeat PFTs. Advised him to closely monitor his oxygen levels and provided with strict ED precautions.   Obstructive sleep apnea Excellent compliance per pt. Unable to review download at [place]. He is followed by Dr. Radford Pax.   Chronic diastolic heart failure ([place]) Appears compensated on exam today. He has had some intermittent leg swelling. Given this, his DOE and new O2  requirement, we will check BNP and BMET today.   SOB (shortness of breath) Progressive DOE over the past few months with acute worsening over the past 3 weeks. Likely multifactorial. Now requiring supplemental O2. See above plan.    I spent 45 minutes of dedicated to the care of this patient on the date of this encounter to include pre-visit review of records, face-to-face time with the patient discussing conditions above, post visit ordering of testing, clinical documentation with the electronic health record, making appropriate referrals as documented, and communicating necessary  findings to members of the patients care team.  Keith Bibles, NP 06/17/2022  Pt aware and understands NP's role.

## 2022-06-17 NOTE — Assessment & Plan Note (Signed)
Excellent compliance per pt. Unable to review download at [place]. He is followed by Dr. Radford Pax.

## 2022-06-17 NOTE — Assessment & Plan Note (Addendum)
Progressive decline in respiratory status over the last few months with increased DOE.  Over the past 3 weeks, he has had worsening shortness of breath and a productive cough. FeNO was normal today; however, CXR shows bronchitic changes and he has bronchospasm on exam. We will treat him for acute exacerbation of his asthma with acute bronchitis with prednisone taper and doxycycline course.  Step up to Trelegy 200 to achieve triple therapy.  Continue albuterol as needed.  Advised that he uses neb at least twice daily until symptoms improve.  Check eosinophils today - may consider biologic therapy if significantly elevated.   Patient Instructions  Stop Wixela. Start Trelegy 1 puff daily. Brush tongue and rinse mouth afterwards.  Continue Albuterol inhaler 2 puffs or 3 mL neb every 6 hours as needed for shortness of breath or wheezing. Notify if symptoms persist despite rescue inhaler/neb use.  Continue nexium 40 mg daily for reflux Continue singulair 10 mg At bedtime  Start supplemental oxygen 2 lpm with activity. Monitor oxygen for goal >88-90%. If you are below this, despite O2 therapy, please seek emergency care.   Prednisone taper. 4 tabs for 2 days, then 3 tabs for 2 days, 2 tabs for 2 days, then 1 tab for 2 days, then stop. Take in am with food.  Doxycycline 1 tab Twice daily for 7 days. Take with food. Wear sunscreen when outside  Mucinex 600 mg Twice daily for chest congestion.   Chest x ray and labs today  Follow up in one week with [name] or Alanson Aly. If symptoms do not improve or worsen, please contact office for sooner follow up or seek emergency care.

## 2022-06-17 NOTE — Assessment & Plan Note (Signed)
Progressive DOE over the past few months with acute worsening over the past 3 weeks. Likely multifactorial. Now requiring supplemental O2. See above plan.

## 2022-06-18 ENCOUNTER — Other Ambulatory Visit: Payer: Self-pay | Admitting: Nurse Practitioner

## 2022-06-18 ENCOUNTER — Ambulatory Visit (HOSPITAL_BASED_OUTPATIENT_CLINIC_OR_DEPARTMENT_OTHER)
Admission: RE | Admit: 2022-06-18 | Discharge: 2022-06-18 | Disposition: A | Discharge status: Home / Self Care | Payer: Medicare Other | Source: Ambulatory Visit | Attending: Nurse Practitioner | Admitting: Nurse Practitioner

## 2022-06-18 ENCOUNTER — Telehealth: Payer: Self-pay | Admitting: Nurse Practitioner

## 2022-06-18 DIAGNOSIS — R7989 Other specified abnormal findings of blood chemistry: Secondary | ICD-10-CM | POA: Insufficient documentation

## 2022-06-18 DIAGNOSIS — R0609 Other forms of dyspnea: Secondary | ICD-10-CM | POA: Diagnosis present

## 2022-06-18 MED ORDER — IOHEXOL 350 MG/ML SOLN
100.0000 mL | Freq: Once | INTRAVENOUS | Status: AC | PRN
  Administered 2022-06-18: 75 mL via INTRAVENOUS

## 2022-06-18 NOTE — Progress Notes (Signed)
Please notify patient that his d dimer was elevated, concerning for possible blot clot. I am ordering a CT scan of his chest for further evaluation. They will be contacting him to schedule this today.   His BNP was also elevated, which is a hormone that the heart produces when it has to pump too hard. It is relatively unchanged compared to 8 months ago but if his CT scan is unremarkable, we may repeat his echocardiogram of his heart for further evaluation. Thanks

## 2022-06-18 NOTE — Telephone Encounter (Signed)
Sent secured chat to danielle about the patients oxygen saturations.

## 2022-06-18 NOTE — Progress Notes (Signed)
Please notify patient CT chest without evidence of PE, which is good news. He does have an area in his left lower lung, which could be possible infection vs area of decreased air flow. I prescribed doxycycline yesterday, which will cover for any infection. Continue with our regimen from yesterday and I will plan to see him next week. If he were to develop any worsening symptoms over the weekend or drops in his oxygen despite O2, please advise him to go to the ED

## 2022-06-18 NOTE — Telephone Encounter (Signed)
Spoke with Andee Poles who stated she received the order for O2 start but when she looked at the walk info, it did not show where pt desaturated to where he required O2.  Stated to Andee Poles that it looked like the walk sats prior to pt being placed on O2 did not get documented and stated to her that I would check with Joellen Jersey as well as the nurse that was working with her about this and she verbalized understanding.  Routing to [place]. Please advise on walk sats when pt was on room air prior to being placed on O2.

## 2022-06-24 ENCOUNTER — Ambulatory Visit: Payer: Medicare Other | Admitting: Nurse Practitioner

## 2022-07-06 ENCOUNTER — Ambulatory Visit: Payer: Medicare Other | Admitting: Nurse Practitioner

## 2022-07-14 ENCOUNTER — Ambulatory Visit: Payer: Medicare Other | Admitting: Nurse Practitioner

## 2022-07-22 ENCOUNTER — Encounter: Payer: Self-pay | Admitting: Nurse Practitioner

## 2022-07-22 ENCOUNTER — Ambulatory Visit: Payer: Medicare Other | Admitting: Nurse Practitioner

## 2022-07-22 VITALS — BP 122/68 | HR 52 | Temp 97.9°F | Ht 68.0 in | Wt 284.0 lb

## 2022-07-22 DIAGNOSIS — G4733 Obstructive sleep apnea (adult) (pediatric): Secondary | ICD-10-CM | POA: Diagnosis not present

## 2022-07-22 DIAGNOSIS — J9601 Acute respiratory failure with hypoxia: Secondary | ICD-10-CM

## 2022-07-22 DIAGNOSIS — J4550 Severe persistent asthma, uncomplicated: Secondary | ICD-10-CM

## 2022-07-22 NOTE — Assessment & Plan Note (Addendum)
Previous CPAP titration from 2016 requiring BiPAP. He has been managed by Dr. Radford Pax. Compliance in the past has been poor. He does report that he feels better when he uses it and has been trying to use it more consistently; however, he has trouble with his pressures and mask leaks. Also of recent, his machine has been cutting off and on. It is around 77 years old and he is hoping to get a new one. Download reviewed which shows significant leaks and poor control of OSA with residual AHI 22.4. His IPAP max IPAP pressures appear to be set too high and contributing. We will decrease his max IPAP to 12. Continue min EPAP 4, PS 4. Attend mask fitting on Friday. Advised him to take his machine as well for troubleshooting while he awaits new machine - order placed today. Cautioned to be aware of and avoid drowsy driving.

## 2022-07-22 NOTE — Progress Notes (Unsigned)
$'@Patient'e$  ID: Keith Bernard, male    DOB: Jul 15, 1946, 76 y.o.   MRN: [id number]  Chief Complaint  Patient presents with   Follow-up    Patient says he's having issues with his bipap.     Referring provider: [name], MD  HPI: 76 year old male, never smoker followed for asthma.  He is a patient of Dr. Golden Pop and last seen in office 06/17/2022 by Belenda Cruise NP.  Past medical history significant for CAD, hypertension, RBBB, DCM, CHF, OSA on CPAP, HLD, obesity, depression.  TEST/EVENTS:  06/19/2020 PFTs: FVC 62, FEV1 64, ratio 79, TLC 71, DLCOcor 86.  Moderate obstruction with reversibility 07/30/2021 HRCT chest: Atherosclerosis.  No LAD.  There are some areas of very mild groundglass attenuation and septal thickening in the periphery of the right lower lobe.  Stable when compared to previous and indeterminate for UIP.  Most likely postinfectious or inflammatory fibrosis.  Inspiratory and expiratory imaging demonstrate some mild air trapping.  There is chronic elevation of the left hemidiaphragm again noted. 09/26/2021 echocardiogram: EF 60 to 65%.  G1 DD.  Elevated left atrial pressure.  RV size and function is normal.  LA is mildly dilated.  Trivial MR. 06/17/2022 CXR 2 view: There are chronic bronchitic changes; no acute process.  Stable moderate eventration of the left hemidiaphragm. 06/18/2022 CTA chest: No evidence of PE.  Atherosclerosis.  No LAD.  Left basilar airspace disease, possibly atelectasis versus pneumonia.  09/2021: [name] with Elie Confer NP.  He had complaints of worsening dyspnea upon exertion.  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  He was referred to public health department to see if he needed treatment.  Very deconditioned and has chronic anemia, both of which are impacting his dyspnea.  Has had some issues with inhalers causing hoarseness but was doing well on Wixela and Spiriva.  Discussed  the role of Biologics in the future.   03/04/2022: OV with [name].  Reported that he was currently forgetting Spiriva and just doing Wixela.  Wife requested change to Trelegy due to simplicity and wanting triple regimen.  Otherwise well and not having any acute symptoms.  06/17/2022: OV with Jamall Strohmeier NP for worsening shortness of breath over the past three weeks.  He called in the office earlier this week reporting that he had been using his albuterol inhaler 4-6 times a day.  He was having severe anxiety because of the shortness of breath and did not even want to leave the house.  He was not sure he could even wait until his office visit.  He was recommended to go to the emergency room.  Today, he reports that he ended up not going.  He still is having increased shortness of breath when compared to his baseline, which has been going on for the past 3 weeks; however, upon further discussion, appears that his dyspnea has been progressively declining over the last few months.  He also has a productive cough that started around 3 weeks ago as well.  He has been having green sputum production.  He has been wheezing.  He does get some relief with albuterol use.  He is concerned that he may need oxygen.  He does occasionally have some lower extremity edema.  He was previously told by cardiology that this is related to his neuropathy.  He has some feelings of generalized weakness and feels like he  is not able to do activities as he used to.  He denies any fevers, chills, hemoptysis, chest pain, palpitations, anorexia, weight loss.  No PND or orthopnea.  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  He never followed up with the health department after his last visit.  He is currently using Wixela for maintenance; had some issues with getting Trelegy previously.  He would like to see if he could go back on this.  He also takes  Singulair at bedtime for trigger prevention. Treated for possible acute asthma exacerbation with prednisone and empiric doxycycline; unclear if this is the exact cause of his symptoms but he had evidence of bronchospasm on exam and bronchitic changes on CXR. Labs with positive d dimer and elevated but stable BNP; CTA chest ordered - negative for PE. Left hilar density - instructed to complete doxycycline.   07/22/2022: Today-follow-up Patient presents today for follow-up with his wife after being treated for possible asthma exacerbation with prednisone and doxycycline.  He also had a left hilar density, concerning for possible pneumonia.  He has completed all of his medications and reports feeling significantly better.  Breathing has improved. Cough has cleared up and hasn't noticed wheezing recently.  He has noticed that he has been a little more hoarse since being started on the Trelegy. Denies any fevers, chills, hemoptysis, chest congestion, increased leg swelling, orthopnea. Oxygen has been stable; hasn't been wearing his supplemental O2 for the past few weeks and has felt like he no longer needs it.  He does sleep in a recliner, which he has done for many years. He is on BiPAP therapy but he has been struggling with his machine and mask recently. His machine will cut off after an hour or so of use. This has been going on for a few months. He also feels like is blows a lot of air and he has a lot of leaks. He's going in for a mask fitting Friday with his DME. Previously managed by Dr. Radford Pax; requested that he switch to [place] for management of his BiPAP since he has been having trouble recently and does not want to wait until his follow up in October.    04/23/2022-07/21/2022 BiPAP Auto IPAP max 20, EPAP min 4, PS 4 77/90 days used; 4% >4 hr; av usage 1 hr 43 min Pressure IPAP 95th 9.4, EPAP 5.5  Leaks 95th 49.9 AHI 22.4  No Known Allergies  Immunization History  Administered Date(s) Administered    DTaP 07/22/2001   Fluad Quad(high Dose 65+) 11/04/2016, 08/26/2020   Influenza Split 09/30/2014   Influenza, High Dose Seasonal PF 11/04/2016, 08/17/2017, 11/03/2018, 09/08/2019   Influenza, Seasonal, Injecte, Preservative Fre 08/30/2017   Influenza,inj,Quad PF,6+ Mos 10/23/2015   Influenza-Unspecified 08/31/2007, 10/12/2013, 09/30/2014, 10/18/2015, 10/23/2015, 08/30/2017   Moderna Sars-Covid-2 Vaccination 02/08/2020, 03/09/2020, 11/14/2020   Pneumococcal Conjugate-13 01/02/2015, 10/01/2015   Pneumococcal Polysaccharide-23 04/11/2012   Pneumococcal-Unspecified 04/11/2012   Tdap 12/22/2016   Zoster Recombinat (Shingrix) 07/21/2017, 01/20/2018   Zoster, Live 01/02/2015    Past Medical History:  Diagnosis Date   Asthma    Chronic diastolic CHF (congestive heart failure) (HCC)    Chronic kidney disease (CKD), stage III (moderate) (HCC)    Chronic pain    Coronary artery disease    S/P STEMI anterior with PCI w BMS of LAD 04/12/2009 20% Prox RCA, 30 %  mid RCA 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 EF 35% but now by echo ER 55% 04/2009   Depression 05/04/2016   DJD (degenerative joint disease)    Dyslipidemia    ETOH abuse    Foot ulcer ([place]) 07/07/2017   GERD (gastroesophageal reflux disease)    Hypertension    Neuropathy    Obesity    OSA (obstructive sleep apnea)    Moderate OSA w AHI 17.7/hr now on VPAP at EEP at 7cm H2O, min PS 3cm H2O,max PS 15cm H2O   RBBB     Tobacco History: Social History   Tobacco Use  Smoking Status Never  Smokeless Tobacco Never   Counseling given: Not Answered   Outpatient Medications Prior to Visit  Medication Sig Dispense Refill   albuterol (PROVENTIL) (2.5 MG/3ML) 0.083% nebulizer solution Take 3 mLs (2.5 mg total) by nebulization every 6 (six) hours as needed for wheezing or shortness of breath. 75 mL 5   albuterol (VENTOLIN HFA) 108 (90 Base) MCG/ACT inhaler INHALE 2 PUFFS INTO THE LUNGS EVERY 6 HOURS AS NEEDED FOR WHEEZING OR  SHORTNESS OF BREATH 18 g 6   aspirin 81 MG tablet Take 81 mg by mouth daily.     atorvastatin (LIPITOR) 20 MG tablet TAKE 1 TABLET BY MOUTH  DAILY 90 tablet 1   CALCIUM PO Take by mouth 2 (two) times daily.      Cholecalciferol (VITAMIN D3) 1000 units CAPS Take 1,000 Units by mouth.     CO ENZYME Q-10 PO Take by mouth daily.      Docusate Sodium (DSS) 100 MG CAPS Take by mouth.     escitalopram (LEXAPRO) 20 MG tablet Take 20 mg by mouth daily.     esomeprazole (NEXIUM) 40 MG capsule Take 40 mg by mouth daily before breakfast.     fenofibrate 160 MG tablet TAKE 1 TABLET BY MOUTH  DAILY WITH FOOD 90 tablet 1   Fluticasone-Umeclidin-Vilant (TRELEGY ELLIPTA) 200-62.5-25 MCG/ACT AEPB Inhale 1 puff into the lungs daily. 60 each 5   Fluticasone-Umeclidin-Vilant (TRELEGY ELLIPTA) 200-62.5-25 MCG/ACT AEPB Inhale 1 puff into the lungs daily. 60 each 0   furosemide (LASIX) 20 MG tablet TAKE 1 TABLET BY MOUTH  EVERY OTHER DAY 45 tablet 1   LORazepam (ATIVAN) 1 MG tablet Take 1 mg by mouth 2 (two) times daily.     Magnesium 500 MG TABS Take 1 tablet by mouth daily.     metoprolol tartrate (LOPRESSOR) 25 MG tablet TAKE 1 TABLET BY MOUTH  TWICE DAILY 180 tablet 3   mirtazapine (REMERON) 15 MG tablet Take 15 mg by mouth at bedtime.     Misc Natural Products (COLON CLEANSE PO) Take by mouth.      Misc. Devices [place] brace. He has a double upright brace that is no longer enough support for the arthritis and instability of his midfoot and ankle on his right.  It is in lieu of surgical correction.     montelukast (SINGULAIR) 10 MG tablet TAKE 1 TABLET BY MOUTH AT  BEDTIME 90 tablet 3   nitroGLYCERIN (NITROSTAT) 0.4 MG SL tablet Place 1 tablet (0.4 mg total) under the tongue every 5 (five) minutes as needed. 25 tablet 3   oxyCODONE HCl 7.5 MG TABS Take 7.5 mg by mouth every 6 (six) hours as needed.     Spacer/Aero-Holding Chambers (AEROCHAMBER MV) inhaler Use as instructed 1 each 0   tamsulosin (FLOMAX) 0.4  MG CAPS capsule Take 0.4 mg  by mouth daily.      Testosterone 12.5 MG/ACT (1%) GEL Apply 1 Squirt topically daily. 75 g 0   thiamine 100 MG tablet Take by mouth.     triamcinolone (NASACORT) 55 MCG/ACT AERO nasal inhaler 2 puffs in each nostril     vitamin B-12 (CYANOCOBALAMIN) 1000 MCG tablet Take 1,000 mcg by mouth daily.     zolpidem (AMBIEN) 10 MG tablet Take 5 mg by mouth at bedtime as needed for sleep.     predniSONE (DELTASONE) 10 MG tablet 4 tabs for 2 days, then 3 tabs for 2 days, 2 tabs for 2 days, then 1 tab for 2 days, then stop (Patient not taking: Reported on 07/22/2022) 20 tablet 0   No facility-administered medications prior to visit.     Review of Systems:   Constitutional: No weight loss or gain, night sweats, fevers, chills, lassitude. +fatigue (improved) HEENT: No headaches, difficulty swallowing, tooth/dental problems, or sore throat. No sneezing, itching, ear ache, nasal congestion, or post nasal drip CV:  +swelling in lower extremities (improved). No chest pain, orthopnea, PND, anasarca, dizziness, palpitations, syncope Resp: +shortness of breath with exertion (baseline; improved). No cough. No wheezing. No hemoptysis. No chest wall deformity GI:  No heartburn, indigestion, abdominal pain, nausea, vomiting, diarrhea, change in bowel habits, loss of appetite, bloody stools.  GU: No dysuria, change in color of urine, urgency or frequency.  No flank pain, no hematuria  Skin: No rash, lesions, ulcerations MSK:  No joint pain or swelling.  No decreased range of motion.  No back pain. Neuro: No dizziness or lightheadedness.  Psych: No depression or anxiety. Mood stable.     Physical Exam:  BP 122/68 (BP Location: Right Arm, Patient Position: Sitting, Cuff Size: Normal)   Pulse (!) 52   Temp 97.9 F (36.6 C) (Oral)   Ht '5\' 8"'$  (1.727 m)   Wt 284 lb (128.8 kg)   SpO2 94%   BMI 43.18 kg/m   GEN: Pleasant, interactive, chronically-ill appearing; morbidly obese; in no  acute distress. HEENT:  Normocephalic and atraumatic. PERRLA. Sclera white. Nasal turbinates pink, moist and patent bilaterally. No rhinorrhea present. Oropharynx pink and moist, without exudate or edema. No lesions, ulcerations, or postnasal drip.  NECK:  Supple w/ fair ROM. No JVD present. Normal carotid impulses w/o bruits. Thyroid symmetrical with no goiter or nodules palpated. No lymphadenopathy.   CV: RRR, no m/r/g, no peripheral edema. Pulses intact, +2 bilaterally. No cyanosis, pallor or clubbing. PULMONARY:  Unlabored, regular breathing. Clear bilaterally A&P w/o wheezes/rales/rhonchi.  No accessory muscle use. No dullness to percussion. GI: BS present and normoactive. Soft, non-tender to palpation. No organomegaly or masses detected. No CVA tenderness. MSK: No erythema, warmth or tenderness. Cap refil <2 sec all extrem. No deformities or joint swelling noted.  Neuro: A/Ox3. No focal deficits noted.   Skin: Warm, no lesions or rashe Psych: Normal affect and behavior. Judgement and thought content appropriate.     Lab Results:  CBC    Component Value Date/Time   WBC 8.0 06/17/2022 1644   RBC 4.03 (L) 06/17/2022 1644   HGB 12.1 (L) 06/17/2022 1644   HGB 12.7 (L) 07/05/2018 1135   HGB 14.1 10/28/2017 1602   HCT 37.4 (L) 06/17/2022 1644   HCT 43.8 10/28/2017 1602   PLT 234.0 06/17/2022 1644   PLT 178 07/05/2018 1135   PLT 201 10/28/2017 1602   MCV 92.7 06/17/2022 1644   MCV 102.6 (H) 10/28/2017 1602   MCH 30.9  01/10/2021 1233   MCHC 32.4 06/17/2022 1644   RDW 14.4 06/17/2022 1644   RDW 13.5 10/28/2017 1602   LYMPHSABS 1.6 06/17/2022 1644   LYMPHSABS 1.4 10/28/2017 1602   MONOABS 0.8 06/17/2022 1644   MONOABS 1.0 (H) 10/28/2017 1602   EOSABS 0.2 06/17/2022 1644   EOSABS 0.1 10/28/2017 1602   BASOSABS 0.1 06/17/2022 1644   BASOSABS 0.0 10/28/2017 1602    BMET    Component Value Date/Time   NA 135 06/17/2022 1644   NA 140 09/26/2021 0945   NA 139 10/28/2017 1602    K 4.3 06/17/2022 1644   K 4.3 10/28/2017 1602   CL 98 06/17/2022 1644   CO2 30 06/17/2022 1644   CO2 26 10/28/2017 1602   GLUCOSE 100 (H) 06/17/2022 1644   GLUCOSE 99 10/28/2017 1602   BUN 18 06/17/2022 1644   BUN 8 09/26/2021 0945   BUN 16.3 10/28/2017 1602   CREATININE 1.16 06/17/2022 1644   CREATININE 0.91 01/10/2021 1233   CREATININE 1.0 10/28/2017 1602   CALCIUM 9.2 06/17/2022 1644   CALCIUM 9.3 10/28/2017 1602   GFRNONAA >60 01/10/2021 1233   GFRAA >60 01/12/2020 1210    BNP No results found for: "BNP"   Imaging:  No results found.       Latest Ref Rng & Units 06/19/2020   12:56 PM 10/17/2015    9:53 AM  PFT Results  FVC-Pre L 2.44  2.22   FVC-Predicted Pre % 62  54   FVC-Post L 2.58  2.34   FVC-Predicted Post % 65  57   Pre FEV1/FVC % % 75  75   Post FEV1/FCV % % 79  80   FEV1-Pre L 1.85  1.66   FEV1-Predicted Pre % 64  55   FEV1-Post L 2.04  1.88   DLCO uncorrected ml/min/mmHg 18.83  19.62   DLCO UNC% % 79  66   DLCO corrected ml/min/mmHg 20.59    DLCO COR %Predicted % 86    DLVA Predicted % 123  121   TLC L 4.78  4.62   TLC % Predicted % 71  69   RV % Predicted % 101  98     Lab Results  Component Value Date   NITRICOXIDE 11 11/11/2018        Assessment & Plan:   Severe asthma without complication Severe asthma; recent exacerbation requiring steroids and abx. He also required supplemental O2 d/t acute hypoxic respiratory failure. He is clinically improved today. Walking oximetry on room air without desaturation. Increased hoarseness with DPI. We will maintain him on triple therapy regimen and change to [facility] with spacer. Hopefully this will reduce his upper airway irritation. If he has further exacerbations in the future, consider biologic therapy. Continue prn albuterol. Continue singulair for trigger prevention.  Patient Instructions  Stop Trelegy. Start Breztri 2 puffs Twice daily with spacer. Brush tongue and rinse mouth afterwards    Continue Albuterol inhaler 2 puffs or 3 mL neb every 6 hours as needed for shortness of breath or wheezing. Notify if symptoms persist despite rescue inhaler/neb use.  Continue nexium 40 mg daily for reflux Continue singulair 10 mg At bedtime  Monitor oxygen levels at home for goal >88-90%; if you are falling 88% or below, please call us. Your oxygen has improved today and you were able to maintain >90% on room air!  Continue BiPAP at night; need to wear for minimum of 4-6 hours. We will send for new machine. Attend  your mask fitting as scheduled. We discussed how untreated sleep apnea puts an individual at risk for cardiac arrhthymias, pulm HTN, DM, stroke and increases their risk for daytime accidents.   Follow up in six weeks with [name] or Joellen Jersey Tyson Masin,NP to see how you are doing on your new BiPAP. If symptoms do not improve or worsen, please contact office for sooner follow up or seek emergency care    Obstructive sleep apnea Previous CPAP titration from 2016 requiring BiPAP. He has been managed by Dr. Radford Pax. Compliance in the past has been poor. He does report that he feels better when he uses it and has been trying to use it more consistently; however, he has trouble with his pressures and mask leaks. Also of recent, his machine has been cutting off and on. It is around 77 years old and he is hoping to get a new one. Download reviewed which shows significant leaks and poor control of OSA with residual AHI 22.4. His IPAP max IPAP pressures appear to be set too high and contributing. We will decrease his max IPAP to 12. Continue min EPAP 4, PS 4. Attend mask fitting on Friday. Advised him to take his machine as well for troubleshooting while he awaits new machine - order placed today. Cautioned to be aware of and avoid drowsy driving.   Acute respiratory failure with hypoxia (HCC) Resolved. Advised to monitor O2 at home for goal >88-90%. We will obtain ONO on BiPAP once he receives new  mask and new machine, and is appropriately controlled     I spent 45 minutes of dedicated to the care of this patient on the date of this encounter to include pre-visit review of records, face-to-face time with the patient discussing conditions above, post visit ordering of testing, clinical documentation with the electronic health record, making appropriate referrals as documented, and communicating necessary findings to members of the patients care team.  Clayton Bibles, NP 07/23/2022  Pt aware and understands NP's role.

## 2022-07-22 NOTE — Assessment & Plan Note (Signed)
Severe asthma; recent exacerbation requiring steroids and abx. He also required supplemental O2 d/t acute hypoxic respiratory failure. He is clinically improved today. Walking oximetry on room air without desaturation. Increased hoarseness with DPI. We will maintain him on triple therapy regimen and change to [facility] with spacer. Hopefully this will reduce his upper airway irritation. If he has further exacerbations in the future, consider biologic therapy. Continue prn albuterol. Continue singulair for trigger prevention.  Patient Instructions  Stop Trelegy. Start Breztri 2 puffs Twice daily with spacer. Brush tongue and rinse mouth afterwards   Continue Albuterol inhaler 2 puffs or 3 mL neb every 6 hours as needed for shortness of breath or wheezing. Notify if symptoms persist despite rescue inhaler/neb use.  Continue nexium 40 mg daily for reflux Continue singulair 10 mg At bedtime  Monitor oxygen levels at home for goal >88-90%; if you are falling 88% or below, please call us. Your oxygen has improved today and you were able to maintain >90% on room air!  Continue BiPAP at night; need to wear for minimum of 4-6 hours. We will send for new machine. Attend your mask fitting as scheduled. We discussed how untreated sleep apnea puts an individual at risk for cardiac arrhthymias, pulm HTN, DM, stroke and increases their risk for daytime accidents.   Follow up in six weeks with [name] or Joellen Jersey Anjoli Diemer,NP to see how you are doing on your new BiPAP. If symptoms do not improve or worsen, please contact office for sooner follow up or seek emergency care

## 2022-07-22 NOTE — Patient Instructions (Addendum)
Stop Trelegy. Start Breztri 2 puffs Twice daily with spacer. Brush tongue and rinse mouth afterwards   Continue Albuterol inhaler 2 puffs or 3 mL neb every 6 hours as needed for shortness of breath or wheezing. Notify if symptoms persist despite rescue inhaler/neb use.  Continue nexium 40 mg daily for reflux Continue singulair 10 mg At bedtime  Monitor oxygen levels at home for goal >88-90%; if you are falling 88% or below, please call us. Your oxygen has improved today and you were able to maintain >90% on room air!  Continue BiPAP at night; need to wear for minimum of 4-6 hours. We will send for new machine. Attend your mask fitting as scheduled. We discussed how untreated sleep apnea puts an individual at risk for cardiac arrhthymias, pulm HTN, DM, stroke and increases their risk for daytime accidents.   Follow up in six weeks with [name] or Joellen Jersey Makaya Juneau,NP to see how you are doing on your new BiPAP. If symptoms do not improve or worsen, please contact office for sooner follow up or seek emergency care

## 2022-07-22 NOTE — Assessment & Plan Note (Signed)
Resolved. Advised to monitor O2 at home for goal >88-90%. We will obtain ONO on BiPAP once he receives new mask and new machine, and is appropriately controlled

## 2022-07-23 ENCOUNTER — Encounter: Payer: Self-pay | Admitting: Nurse Practitioner

## 2022-07-23 MED ORDER — BREZTRI AEROSPHERE 160-9-4.8 MCG/ACT IN AERO: 2.0000 | INHALATION_SPRAY | Freq: Two times a day (BID) | RESPIRATORY_TRACT | 0 refills | Status: DC

## 2022-07-31 ENCOUNTER — Telehealth: Payer: Self-pay | Admitting: Nurse Practitioner

## 2022-07-31 DIAGNOSIS — J9601 Acute respiratory failure with hypoxia: Secondary | ICD-10-CM

## 2022-07-31 NOTE — Telephone Encounter (Signed)
I called the patient wife per DPR and was not able to leave a message that the order was placed to discontinue his day time oxygen only Per Roxan Diesel NP.

## 2022-08-11 ENCOUNTER — Telehealth: Payer: Self-pay | Admitting: Nurse Practitioner

## 2022-08-12 MED ORDER — BREZTRI AEROSPHERE 160-9-4.8 MCG/ACT IN AERO: 2.0000 | INHALATION_SPRAY | Freq: Two times a day (BID) | RESPIRATORY_TRACT | 6 refills | Status: DC

## 2022-08-12 NOTE — Addendum Note (Signed)
Addended by: Vanessa Barbara on: 08/12/2022 03:33 PM   Modules accepted: Orders

## 2022-08-12 NOTE — Telephone Encounter (Signed)
Patient's wife is calling again for a refill of his Keith Bernard.  She sent a message yesterday and did not get a reply.  She also stated that patient is completely out of the medication.  She wants to make sure that the medication is sent in today.  Please let patient know the status.

## 2022-08-12 NOTE — Telephone Encounter (Signed)
ATC x1.  LVM to return call.  Refill for [facility] sent to [facility] in [place] per request.

## 2022-08-14 NOTE — Telephone Encounter (Signed)
Called and spoke with patient's wife, Keith Bernard ([place]).  She states she got the message and they were on the way to get his medication at the time of my call.  Nothing further needed.

## 2022-09-02 ENCOUNTER — Ambulatory Visit: Payer: Medicare Other | Admitting: Nurse Practitioner

## 2022-09-02 ENCOUNTER — Encounter: Payer: Self-pay | Admitting: Nurse Practitioner

## 2022-09-02 VITALS — BP 136/82 | HR 64 | Ht 68.0 in | Wt 286.2 lb

## 2022-09-02 DIAGNOSIS — J9601 Acute respiratory failure with hypoxia: Secondary | ICD-10-CM | POA: Diagnosis not present

## 2022-09-02 DIAGNOSIS — G4733 Obstructive sleep apnea (adult) (pediatric): Secondary | ICD-10-CM

## 2022-09-02 DIAGNOSIS — J4550 Severe persistent asthma, uncomplicated: Secondary | ICD-10-CM | POA: Diagnosis not present

## 2022-09-02 DIAGNOSIS — R5381 Other malaise: Secondary | ICD-10-CM

## 2022-09-02 DIAGNOSIS — F411 Generalized anxiety disorder: Secondary | ICD-10-CM

## 2022-09-02 DIAGNOSIS — Z6841 Body Mass Index (BMI) 40.0 and over, adult: Secondary | ICD-10-CM

## 2022-09-02 NOTE — Assessment & Plan Note (Signed)
Complex sleep apnea requiring BiPAP therapy. He has struggled with poor compliance in the past. We again discussed the importance of therapy and risks of untreated OSA. He did recently switch to a nasal mask, which has helped with feelings of claustrophobia. Advised him to use BiPAP during naps as well.  For some reason, his current BiPAP is not set on auto titration, as previously prescribed. His breakthrough events are significantly improved with decreased IPAP setting. We will adjust him to auto set IPAP max 12, EPAP max 4 and PS 4 to see if we can get him down any further or identify optimal pressures.   Patient Instructions  Continue Breztri 2 puffs Twice daily with spacer. Brush tongue and rinse mouth afterwards   Continue Albuterol inhaler 2 puffs or 3 mL neb every 6 hours as needed for shortness of breath or wheezing. Notify if symptoms persist despite rescue inhaler/neb use.  Continue nexium 40 mg daily for reflux Continue singulair 10 mg At bedtime    Increase BiPAP usage at night; need to wear for minimum of 4-6 hours. Use during the day when you are napping too.  We discussed how untreated sleep apnea puts an individual at risk for cardiac arrhthymias, pulm HTN, DM, stroke and increases their risk for daytime accidents.  Talk to your psychiatrist about your sleep and anxiety medicines when you see them later this week   Referral to medical weight management    Follow up in three months with [name] or Roxan Diesel NP. If symptoms do not improve or worsen, please contact office for sooner follow up or seek emergency care

## 2022-09-02 NOTE — Assessment & Plan Note (Signed)
Physical deconditioning related to sedentary lifestyle and obesity. He struggles with exercise due to neuropathy and back pain. Referral to [facility] for aquatic therapy.

## 2022-09-02 NOTE — Progress Notes (Signed)
@Patient  ID: Keith Bernard, male    DOB: [birth year], 76 y.o.   MRN: [id number]  Chief Complaint  Patient presents with   Follow-up    Pt f/u for OSA, he uses a BiPAP and reports having a difficult time getting used to the machine. He feels like he is suffocating when using the machine mask, and reports having to take an Ambien to relax and wear the mask.    Referring provider: [name], MD  HPI: 76 year old male, never smoker followed for asthma.  He is a patient of Dr. [name] and last seen in office 07/22/2022 by [facility] NP.  Past medical history significant for CAD, hypertension, RBBB, DCM, CHF, OSA on CPAP, HLD, obesity, depression.  TEST/EVENTS:  06/19/2020 PFTs: FVC 62, FEV1 64, ratio 79, TLC 71, DLCOcor 86.  Moderate obstruction with reversibility 07/30/2021 HRCT chest: Atherosclerosis.  No LAD.  There are some areas of very mild groundglass attenuation and septal thickening in the periphery of the right lower lobe.  Stable when compared to previous and indeterminate for UIP.  Most likely postinfectious or inflammatory fibrosis.  Inspiratory and expiratory imaging demonstrate some mild air trapping.  There is chronic elevation of the left hemidiaphragm again noted. 09/26/2021 echocardiogram: EF 60 to 65%.  G1 DD.  Elevated left atrial pressure.  RV size and function is normal.  LA is mildly dilated.  Trivial MR. 06/17/2022 CXR 2 view: There are chronic bronchitic changes; no acute process.  Stable moderate eventration of the left hemidiaphragm. 06/18/2022 CTA chest: No evidence of PE.  Atherosclerosis.  No LAD.  Left basilar airspace disease, possibly atelectasis versus pneumonia.  09/2021: Sudie Grumbling with [facility] NP.  He had complaints of worsening dyspnea upon exertion.  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  He was referred to public health department to see if he needed treatment.  Very  deconditioned and has chronic anemia, both of which are impacting his dyspnea.  Has had some issues with inhalers causing hoarseness but was doing well on Wixela and Spiriva.  Discussed the role of Biologics in the future.   03/04/2022: OV with Dr. Marchelle Gearing.  Reported that he was currently forgetting Spiriva and just doing Wixela.  Wife requested change to Trelegy due to simplicity and wanting triple regimen.  Otherwise well and not having any acute symptoms.  06/17/2022: OV with [name] NP for worsening shortness of breath over the past three weeks.  He called in the office earlier this week reporting that he had been using his albuterol inhaler 4-6 times a day.  He was having severe anxiety because of the shortness of breath and did not even want to leave the house.  He was not sure he could even wait until his office visit.  He was recommended to go to the emergency room.  Today, he reports that he ended up not going.  He still is having increased shortness of breath when compared to his baseline, which has been going on for the past 3 weeks; however, upon further discussion, appears that his dyspnea has been progressively declining over the last few months.  He also has a productive cough that started around 3 weeks ago as well.  He has been having green sputum production.  He has been wheezing.  He does get some relief with albuterol use.  He is concerned that he may need oxygen.  He does occasionally have some lower extremity edema.  He was previously told by cardiology that this is related to his neuropathy.  He has some feelings of generalized weakness and feels like he is not able to do activities as he used to.  He denies any fevers, chills, hemoptysis, chest pain, palpitations, anorexia, weight loss.  No PND or orthopnea.  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  He never followed up with the health  department after his last visit.  He is currently using Wixela for maintenance; had some issues with getting Trelegy previously.  He would like to see if he could go back on this.  He also takes Singulair at bedtime for trigger prevention. Treated for possible acute asthma exacerbation with prednisone and empiric doxycycline; unclear if this is the exact cause of his symptoms but he had evidence of bronchospasm on exam and bronchitic changes on CXR. Labs with positive d dimer and elevated but stable BNP; CTA chest ordered - negative for PE. Left hilar density - instructed to complete doxycycline.   07/22/2022: OV with [name] NP for follow-up with his wife after being treated for possible asthma exacerbation with prednisone and doxycycline.  He also had a left hilar density, concerning for possible pneumonia.  He has completed all of his medications and reports feeling significantly better.  Breathing has improved. Cough has cleared up and hasn't noticed wheezing recently.  He has noticed that he has been a little more hoarse since being started on the Trelegy. Denies any fevers, chills, hemoptysis, chest congestion, increased leg swelling, orthopnea. Change from Trelegy to [facility] with spacer to see if this reduces hoarseness. Oxygen has been stable; hasn't been wearing his supplemental O2 for the past few weeks and has felt like he no longer needs it.  He does sleep in a recliner, which he has done for many years. He is on BiPAP therapy but he has been struggling with his machine and mask recently. His machine will cut off after an hour or so of use. This has been going on for a few months. He also feels like is blows a lot of air and he has a lot of leaks. He's going in for a mask fitting Friday with his DME. Previously managed by Dr. Mayford Knife; requested that he switch to [place] for management of his BiPAP since he has been having trouble recently and does not want to wait until his follow up in October. Download with  mediocre compliance but also significant breakthrough events/leaks. Orders sent for new machine with adjusted settings to max IPAP 12; no change to EPAP min 4 and PS 4; educated on importance of compliance.   09/02/2022: Today-follow-up Patient presents today with his wife for follow-up.  He has been struggling with anxiety and panic attacks recently.  He feels like this causes him to have trouble with sleeping at night, especially putting his BiPAP on.  He was feeling pretty claustrophobic with the full facemask.  He recently changed just a few nights ago to the nasal mask.  Feels like this is worked better for him.  He understands that he needs to be using his machine nightly.  His wife tells me that he has a poor sleep schedule.  He will stay up until around 4 AM, then sleep for a little while then wake up and then sleep  on and off throughout the day.  He does not use his BiPAP when napping during the day.  Denies any drowsy driving or morning headaches.  He is followed by a psychiatrist who has been working on adjusting his meds.  He was recently started on hydroxyzine 3 times a day.  He also is on Lexapro and Ativan for anxiety.  He is on Remeron and Ambien to help with sleep at night.  They did recently adjust his Ambien to a slightly lower dose.  He is worried that they were going to stop giving it to him, which has caused more anxiety. From a respiratory standpoint, he feels like his breathing is relatively stable.  He still has a little bit of hoarseness but he has been using the Trelegy still.  He wanted to finish up what he had at home.  He started using the Breztri a few days ago.  He does have an occasional cough, which is unchanged from his baseline.  Denies any increased chest congestion, wheezing, lower extremity swelling.  Rarely uses albuterol.  Takes Nexium for reflux and Singulair for allergies/asthma. Wants to work on weight loss measures.   08/02/2022-08/31/2022: BiPAP IPAP 12 cmH2O, EPAP 4  cmH2O 21/30 days used; 23% > 4 hr; average usage 3 hours 18 minutes Leaks median 3.1, 95th 27.2 AHI 6.9  No Known Allergies  Immunization History  Administered Date(s) Administered   DTaP 07/22/2001   Fluad Quad(high Dose 65+) 11/04/2016, 08/26/2020   Influenza Split 09/30/2014   Influenza, High Dose Seasonal PF 11/04/2016, 08/17/2017, 11/03/2018, 09/08/2019   Influenza, Seasonal, Injecte, Preservative Fre 08/30/2017   Influenza,inj,Quad PF,6+ Mos 10/23/2015   Influenza-Unspecified 08/31/2007, 10/12/2013, 09/30/2014, 10/18/2015, 10/23/2015, 08/30/2017   Moderna Sars-Covid-2 Vaccination 02/08/2020, 03/09/2020, 11/14/2020   Pneumococcal Conjugate-13 01/02/2015, 10/01/2015   Pneumococcal Polysaccharide-23 04/11/2012   Pneumococcal-Unspecified 04/11/2012   Tdap 12/22/2016   Zoster Recombinat (Shingrix) 07/21/2017, 01/20/2018   Zoster, Live 01/02/2015    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 EF 35% but now by echo ER 55% 04/2009   Depression 05/04/2016   DJD (degenerative joint disease)    Dyslipidemia    ETOH abuse    Foot ulcer (HCC) 07/07/2017   GERD (gastroesophageal reflux disease)    Hypertension    Neuropathy    Obesity    OSA (obstructive sleep apnea)    Moderate OSA w AHI 17.7/hr now on VPAP at EEP at 7cm H2O, min PS 3cm H2O,max PS 15cm H2O   RBBB     Tobacco History: Social History   Tobacco Use  Smoking Status Never  Smokeless Tobacco Never   Counseling given: Not Answered   Outpatient Medications Prior to Visit  Medication Sig Dispense Refill   albuterol (PROVENTIL) (2.5 MG/3ML) 0.083% nebulizer solution Take 3 mLs (2.5 mg total) by nebulization every 6  (six) hours as needed for wheezing or shortness of breath. 75 mL 5   albuterol (VENTOLIN HFA) 108 (90 Base) MCG/ACT inhaler INHALE 2 PUFFS INTO THE LUNGS EVERY 6 HOURS AS NEEDED FOR WHEEZING OR SHORTNESS OF BREATH 18 g 6   aspirin 81 MG tablet Take 81 mg by  mouth daily.     atorvastatin (LIPITOR) 20 MG tablet TAKE 1 TABLET BY MOUTH  DAILY 90 tablet 1   Budeson-Glycopyrrol-Formoterol (BREZTRI AEROSPHERE) 160-9-4.8 MCG/ACT AERO Inhale 2 puffs into the lungs in the morning and at bedtime. 10.7 g 0   Budeson-Glycopyrrol-Formoterol (BREZTRI AEROSPHERE) 160-9-4.8 MCG/ACT AERO Inhale 2 puffs into the lungs in the morning and at bedtime. 10.7 g 6   CALCIUM PO Take by mouth 2 (two) times daily.      Cholecalciferol (VITAMIN D3) 1000 units CAPS Take 1,000 Units by mouth.     CO ENZYME Q-10 PO Take by mouth daily.      Docusate Sodium (DSS) 100 MG CAPS Take by mouth.     escitalopram (LEXAPRO) 20 MG tablet Take 20 mg by mouth daily.     esomeprazole (NEXIUM) 40 MG capsule Take 40 mg by mouth daily before breakfast.     fenofibrate 160 MG tablet TAKE 1 TABLET BY MOUTH  DAILY WITH FOOD 90 tablet 1   furosemide (LASIX) 20 MG tablet TAKE 1 TABLET BY MOUTH  EVERY OTHER DAY 45 tablet 1   LORazepam (ATIVAN) 1 MG tablet Take 1 mg by mouth 2 (two) times daily.     Magnesium 500 MG TABS Take 1 tablet by mouth daily.     metoprolol tartrate (LOPRESSOR) 25 MG tablet TAKE 1 TABLET BY MOUTH  TWICE DAILY 180 tablet 3   mirtazapine (REMERON) 15 MG tablet Take 15 mg by mouth at bedtime.     Misc Natural Products (COLON CLEANSE PO) Take by mouth.      Misc. Devices MISC [place] brace. He has a double upright brace that is no longer enough support for the arthritis and instability of his midfoot and ankle on his right.  It is in lieu of surgical correction.     montelukast (SINGULAIR) 10 MG tablet TAKE 1 TABLET BY MOUTH AT  BEDTIME 90 tablet 3   nitroGLYCERIN (NITROSTAT) 0.4 MG SL tablet Place 1 tablet (0.4 mg total) under  the tongue every 5 (five) minutes as needed. 25 tablet 3   oxyCODONE HCl 7.5 MG TABS Take 7.5 mg by mouth every 6 (six) hours as needed.     predniSONE (DELTASONE) 10 MG tablet 4 tabs for 2 days, then 3 tabs for 2 days, 2 tabs for 2 days, then 1 tab for 2 days, then stop 20 tablet 0   Spacer/Aero-Holding Chambers (AEROCHAMBER MV) inhaler Use as instructed 1 each 0   tamsulosin (FLOMAX) 0.4 MG CAPS capsule Take 0.4 mg by mouth daily.      Testosterone 12.5 MG/ACT (1%) GEL Apply 1 Squirt topically daily. 75 g 0   thiamine 100 MG tablet Take by mouth.     triamcinolone (NASACORT) 55 MCG/ACT AERO nasal inhaler 2 puffs in each nostril     vitamin B-12 (CYANOCOBALAMIN) 1000 MCG tablet Take 1,000 mcg by mouth daily.     zolpidem (AMBIEN) 10 MG tablet Take 5 mg by mouth at bedtime as needed for sleep.     No facility-administered medications prior to visit.     Review of Systems:   Constitutional: No weight loss or gain, night sweats, fevers, chills, lassitude. +fatigue  HEENT: No headaches, difficulty swallowing, tooth/dental problems, or sore throat. No sneezing, itching, ear ache, nasal congestion, or post nasal drip. +hoarseness CV:  +swelling in lower extremities (improved). No chest pain, orthopnea, PND, anasarca, dizziness, palpitations, syncope Resp: +shortness of breath with exertion (baseline); occasional cough. No wheezing.  No hemoptysis. No chest wall deformity GI:  No heartburn, indigestion, abdominal pain, nausea, vomiting, diarrhea, change in bowel habits, loss of appetite, bloody stools.  MSK:  No joint pain or swelling.  No decreased range of motion.  +chronic back pain. Neuro: No dizziness or lightheadedness.  Psych: +sleep disturbance, increased anxiety. No SI/HI.    Physical Exam:  BP 136/82   Pulse 64   Ht 5\' 8"  (1.727 m)   Wt 286 lb 3.2 oz (129.8 kg)   SpO2 93%   BMI 43.52 kg/m   GEN: Pleasant, interactive, chronically-ill appearing; morbidly obese; in no acute  distress. HEENT:  Normocephalic and atraumatic. PERRLA. Sclera white. Nasal turbinates pink, moist and patent bilaterally. No rhinorrhea present. Oropharynx pink and moist, without exudate or edema. No lesions, ulcerations, or postnasal drip.  NECK:  Supple w/ fair ROM. No JVD present. Normal carotid impulses w/o bruits. Thyroid symmetrical with no goiter or nodules palpated. No lymphadenopathy.   CV: RRR, no m/r/g, no peripheral edema. Pulses intact, +2 bilaterally. No cyanosis, pallor or clubbing. PULMONARY:  Unlabored, regular breathing. Clear bilaterally A&P w/o wheezes/rales/rhonchi.  No accessory muscle use. No dullness to percussion. GI: BS present and normoactive. Soft, non-tender to palpation. No organomegaly or masses detected.  MSK: No erythema, warmth or tenderness. Cap refil <2 sec all extrem. No deformities or joint swelling noted.  Neuro: A/Ox3. No focal deficits noted.   Skin: Warm, no lesions or rashe Psych: Normal affect and behavior. Judgement and thought content appropriate.     Lab Results:  CBC    Component Value Date/Time   WBC 8.0 06/17/2022 1644   RBC 4.03 (L) 06/17/2022 1644   HGB 12.1 (L) 06/17/2022 1644   HGB 12.7 (L) 07/05/2018 1135   HGB 14.1 10/28/2017 1602   HCT 37.4 (L) 06/17/2022 1644   HCT 43.8 10/28/2017 1602   PLT 234.0 06/17/2022 1644   PLT 178 07/05/2018 1135   PLT 201 10/28/2017 1602   MCV 92.7 06/17/2022 1644   MCV 102.6 (H) 10/28/2017 1602   MCH 30.9 01/10/2021 1233   MCHC 32.4 06/17/2022 1644   RDW 14.4 06/17/2022 1644   RDW 13.5 10/28/2017 1602   LYMPHSABS 1.6 06/17/2022 1644   LYMPHSABS 1.4 10/28/2017 1602   MONOABS 0.8 06/17/2022 1644   MONOABS 1.0 (H) 10/28/2017 1602   EOSABS 0.2 06/17/2022 1644   EOSABS 0.1 10/28/2017 1602   BASOSABS 0.1 06/17/2022 1644   BASOSABS 0.0 10/28/2017 1602    BMET    Component Value Date/Time   NA 135 06/17/2022 1644   NA 140 09/26/2021 0945   NA 139 10/28/2017 1602   K 4.3 06/17/2022 1644    K 4.3 10/28/2017 1602   CL 98 06/17/2022 1644   CO2 30 06/17/2022 1644   CO2 26 10/28/2017 1602   GLUCOSE 100 (H) 06/17/2022 1644   GLUCOSE 99 10/28/2017 1602   BUN 18 06/17/2022 1644   BUN 8 09/26/2021 0945   BUN 16.3 10/28/2017 1602   CREATININE 1.16 06/17/2022 1644   CREATININE 0.91 01/10/2021 1233   CREATININE 1.0 10/28/2017 1602   CALCIUM 9.2 06/17/2022 1644   CALCIUM 9.3 10/28/2017 1602   GFRNONAA >60 01/10/2021 1233   GFRAA >60 01/12/2020 1210    BNP No results found for: "BNP"   Imaging:  No results found.       Latest Ref Rng & Units 06/19/2020   12:56 PM 10/17/2015    9:53 AM  PFT Results  FVC-Pre L 2.44  2.22   FVC-Predicted Pre % 62  54   FVC-Post L 2.58  2.34   FVC-Predicted Post % 65  57   Pre FEV1/FVC % % 75  75   Post FEV1/FCV % % 79  80   FEV1-Pre L 1.85  1.66   FEV1-Predicted Pre % 64  55   FEV1-Post L 2.04  1.88   DLCO uncorrected ml/min/mmHg 18.83  19.62   DLCO UNC% % 79  66   DLCO corrected ml/min/mmHg 20.59    DLCO COR %Predicted % 86    DLVA Predicted % 123  121   TLC L 4.78  4.62   TLC % Predicted % 71  69   RV % Predicted % 101  98     Lab Results  Component Value Date   NITRICOXIDE 11 11/11/2018        Assessment & Plan:   Obstructive sleep apnea Complex sleep apnea requiring BiPAP therapy. He has struggled with poor compliance in the past. We again discussed the importance of therapy and risks of untreated OSA. He did recently switch to a nasal mask, which has helped with feelings of claustrophobia. Advised him to use BiPAP during naps as well.  For some reason, his current BiPAP is not set on auto titration, as previously prescribed. His breakthrough events are significantly improved with decreased IPAP setting. We will adjust him to auto set IPAP max 12, EPAP max 4 and PS 4 to see if we can get him down any further or identify optimal pressures.   Patient Instructions  Continue Breztri 2 puffs Twice daily with spacer.  Brush tongue and rinse mouth afterwards   Continue Albuterol inhaler 2 puffs or 3 mL neb every 6 hours as needed for shortness of breath or wheezing. Notify if symptoms persist despite rescue inhaler/neb use.  Continue nexium 40 mg daily for reflux Continue singulair 10 mg At bedtime    Increase BiPAP usage at night; need to wear for minimum of 4-6 hours. Use during the day when you are napping too.  We discussed how untreated sleep apnea puts an individual at risk for cardiac arrhthymias, pulm HTN, DM, stroke and increases their risk for daytime accidents.  Talk to your psychiatrist about your sleep and anxiety medicines when you see them later this week   Referral to medical weight management    Follow up in three months with Dr. Marchelle Gearing or Rhunette Croft NP. If symptoms do not improve or worsen, please contact office for sooner follow up or seek emergency care    Severe asthma without complication He is compensated on current regimen. Recently switched from Trelegy to [place]. Hopefully this helps reduce his hoarseness. No changes to current regimen. Asthma action plan in place.   Obesity Severe obesity with BMI 43. He would like to start working on healthy weight loss measures. Referred to medical weight management today.   Physical deconditioning Physical deconditioning related to sedentary lifestyle and obesity. He struggles with exercise due to neuropathy and back pain. Referral to [facility] for aquatic therapy.   GAD (generalized anxiety disorder) We had a very lengthy discussion regarding his anxiety and associated insomnia. He's on multiple different medications. Recent addition of hydroxyzine. He is following up with his psychiatrist on 10/6 so advised that he discuss further treatment options.      I spent 45 minutes of dedicated to the care of this patient on the date of this encounter to include pre-visit review of records, face-to-face  time with the patient discussing  conditions above, post visit ordering of testing, clinical documentation with the electronic health record, making appropriate referrals as documented, and communicating necessary findings to members of the patients care team.  Noemi Chapel, NP 09/03/2022  Pt aware and understands NP's role.

## 2022-09-02 NOTE — Assessment & Plan Note (Signed)
Severe obesity with BMI 43. He would like to start working on healthy weight loss measures. Referred to medical weight management today.

## 2022-09-02 NOTE — Assessment & Plan Note (Signed)
We had a very lengthy discussion regarding his anxiety and associated insomnia. He's on multiple different medications. Recent addition of hydroxyzine. He is following up with his psychiatrist on 10/6 so advised that he discuss further treatment options.

## 2022-09-02 NOTE — Assessment & Plan Note (Signed)
He is compensated on current regimen. Recently switched from Trelegy to [place]. Hopefully this helps reduce his hoarseness. No changes to current regimen. Asthma action plan in place.

## 2022-09-02 NOTE — Patient Instructions (Addendum)
Continue Breztri 2 puffs Twice daily with spacer. Brush tongue and rinse mouth afterwards   Continue Albuterol inhaler 2 puffs or 3 mL neb every 6 hours as needed for shortness of breath or wheezing. Notify if symptoms persist despite rescue inhaler/neb use.  Continue nexium 40 mg daily for reflux Continue singulair 10 mg At bedtime    Increase BiPAP usage at night; need to wear for minimum of 4-6 hours. Use during the day when you are napping too.  We discussed how untreated sleep apnea puts an individual at risk for cardiac arrhthymias, pulm HTN, DM, stroke and increases their risk for daytime accidents.  Talk to your psychiatrist about your sleep and anxiety medicines when you see them later this week   Referral to medical weight management    Follow up in three months with [name] or Roxan Diesel NP. If symptoms do not improve or worsen, please contact office for sooner follow up or seek emergency care

## 2022-09-03 ENCOUNTER — Encounter: Payer: Self-pay | Admitting: Nurse Practitioner

## 2022-09-05 ENCOUNTER — Other Ambulatory Visit: Payer: Self-pay | Admitting: Cardiology

## 2022-09-07 NOTE — Telephone Encounter (Signed)
Patient was seen by Keith Bernard on 10/04. Will close encounter.

## 2022-09-14 ENCOUNTER — Telehealth: Payer: Self-pay | Admitting: Internal Medicine

## 2022-09-15 MED ORDER — BREZTRI AEROSPHERE 160-9-4.8 MCG/ACT IN AERO: 2.0000 | INHALATION_SPRAY | Freq: Two times a day (BID) | RESPIRATORY_TRACT | 0 refills | Status: DC

## 2022-09-15 NOTE — Telephone Encounter (Signed)
Yes, keep him on Breztri. Ok to provide with sample. He can try for patient assistance. I'd run a test claim as well to see what our alternative options would be if he's unable to keep filling Breztri. Thanks

## 2022-09-15 NOTE — Telephone Encounter (Signed)
I called the patient back and the wife answered and she wen to pick up the Breztri at the pharmacy and was told it would be $178 and they can't  afford the medication.  She wants to know about a sample and if you want to keep him on the [place]. He does fele that its helping.Does he need to fill out patient assistance?   Do we need to run a pharmacy prior authorization? Please advise.

## 2022-09-16 ENCOUNTER — Other Ambulatory Visit (HOSPITAL_COMMUNITY): Payer: Self-pay

## 2022-09-16 NOTE — Telephone Encounter (Signed)
Per test claims of alternatives, the patient has a deductible to meet before the co-pays to their medication will go down.  Trelegy shows a co-pay of $170.73 with that total amount being attributed to the deductible.

## 2022-09-16 NOTE — Telephone Encounter (Signed)
The patient is aware of the samples per [facility].  I have placed patient assistance in the bag with the samples and he will bring it back to the office visit in 2 weeks.    Can we run a benefits investigation for the [facility] please?

## 2022-09-23 NOTE — Telephone Encounter (Signed)
Appears this is related to his deductible. He can complete patient assistance for Breztri to see if he can get some help. We can provide him with samples while we wait to hear back, if needed.

## 2022-09-25 NOTE — Telephone Encounter (Signed)
Routing to [place] for follow up.

## 2022-09-27 ENCOUNTER — Other Ambulatory Visit: Payer: Self-pay | Admitting: Cardiology

## 2022-10-02 NOTE — Telephone Encounter (Signed)
Looked in [place] cubby in [place] and did not see any paperwork for this patient. Attempted to call [employer] but they were having issues with their phones. Will try again later.

## 2022-10-04 ENCOUNTER — Other Ambulatory Visit: Payer: Self-pay | Admitting: Cardiology

## 2022-10-05 ENCOUNTER — Telehealth: Payer: Self-pay | Admitting: Cardiology

## 2022-10-05 NOTE — Telephone Encounter (Signed)
Called to schedule sleep compliance appt with Dr. Radford Pax, spoke to pt's wife who states that the patient followed up with Pulmonary - Dr. Golden Pop office for compliance.

## 2022-10-08 ENCOUNTER — Other Ambulatory Visit: Payer: Self-pay | Admitting: Cardiology

## 2022-10-09 NOTE — Telephone Encounter (Signed)
Call made to [employer] per the recording they are closed for the holiday. Will try back at next business day.   Will route back to triage to see if there are samples available before contacting patient since I am working off campus today.

## 2022-10-09 NOTE — Telephone Encounter (Signed)
Jocelyn Lamer wife checking on [place] form for [employer]. Jocelyn Lamer phone number is [phone].

## 2022-10-12 ENCOUNTER — Telehealth: Payer: Self-pay | Admitting: Internal Medicine

## 2022-10-12 MED ORDER — BREZTRI AEROSPHERE 160-9-4.8 MCG/ACT IN AERO: 2.0000 | INHALATION_SPRAY | Freq: Two times a day (BID) | RESPIRATORY_TRACT | 0 refills | Status: DC

## 2022-10-12 NOTE — Telephone Encounter (Signed)
Attempted to call pt's spouse but unable to reach. Left message for her to return call. 

## 2022-10-12 NOTE — Telephone Encounter (Signed)
2 samples of Breztri up front for pick up pending pt assistance  I called spouse and left detailed msg on machine letting them know ok per [facility]

## 2022-10-12 NOTE — Telephone Encounter (Signed)
Received a fax from  Keith Bernard regarding an approval for  [place]  patient assistance from 10/11/2022 to 11/30/2023. Approval letter sent to scan center.  [place] Phone number: [phone]  Please reach out to pt to provide update.

## 2022-10-14 ENCOUNTER — Telehealth: Payer: Self-pay | Admitting: Internal Medicine

## 2022-10-15 NOTE — Telephone Encounter (Signed)
Spoke with pt's wife Vickie (per DPR) who states pt has been approved for [place] assistance but [name] will not be delivered until next week sometime. Pt picked up samples from office last week and one only contained 6 puffs before it ran out. Vickie states that pt does have 1 more sample that should last until after shipment arrives. Vickie is worried that if that sample is short as well they will not have the inhaler for this weekend. I informed Vickie that if needed we could supply pt with 1 additional sample. Vickie stated understanding.   Routing message as Juluis Rainier to [employer] as [employer]

## 2022-11-20 ENCOUNTER — Other Ambulatory Visit: Payer: Self-pay | Admitting: Internal Medicine

## 2022-11-28 ENCOUNTER — Other Ambulatory Visit: Payer: Self-pay | Admitting: Cardiology

## 2022-12-09 ENCOUNTER — Encounter: Payer: Self-pay | Admitting: Internal Medicine

## 2022-12-09 ENCOUNTER — Ambulatory Visit: Payer: Medicare Other | Admitting: Internal Medicine

## 2022-12-09 VITALS — BP 126/74 | HR 60 | Temp 98.4°F | Ht 69.0 in | Wt 292.4 lb

## 2022-12-09 DIAGNOSIS — R0609 Other forms of dyspnea: Secondary | ICD-10-CM

## 2022-12-09 DIAGNOSIS — R5381 Other malaise: Secondary | ICD-10-CM | POA: Diagnosis not present

## 2022-12-09 DIAGNOSIS — J4550 Severe persistent asthma, uncomplicated: Secondary | ICD-10-CM

## 2022-12-09 MED ORDER — BREZTRI AEROSPHERE 160-9-4.8 MCG/ACT IN AERO: 2.0000 | INHALATION_SPRAY | Freq: Two times a day (BID) | RESPIRATORY_TRACT | 0 refills | Status: DC

## 2022-12-09 NOTE — Progress Notes (Signed)
OV 01/29/2016  Chief Complaint  Patient presents with   Follow-up    Pt here after acute visit with SG on 2.10.17. Pt states his breathing has imrpoved since last OV. Pt states he has occassional wheezing and mild non prod cough. Pt denies CP/tightness.     Keith Bernard returns for chronic obstructive lung disease asthma follow-up. He is now on Dulera plus Singulair. He is compliant with Dulera. He says with improved compliance his symptoms are a lot better. His wife is here with him. Most recently he had a flareup and was treated with prednisone. I review the chart when he was seen by [name] gross my nurse practitioner. Currently he feels asthma is well controlled. However he still uses albuterol multiple times a day for rescue but denies any nocturnal symptoms or daytime symptoms. He is compliant with this CPAP for sleep apnea.  Review of the chart shows this is in December 2012 has elevated eos 400 cells per cubic millimeter. I notice that and IgE was never checked. In any event subjectively is well controlled currently. Also noted on alpha-1 has not been checked.  Asthma control questionnaire shows that at night he hardly ever wakes up because of asthma. When he wakes up he has very mild symptoms. In the daytime he is moderately limited because of asthma although this could be because of obesity. He also has moderate shortness of breath because of asthma although this could be because of obesity. He is wheezing a lot of the time and uses albuterol 3-4 puffs on most days. Nevertheless average score of 2.2 show significant improvement after starting [name] 05/04/2016  Chief Complaint  Patient presents with   Follow-up    Pt c/o occasional wheeze since not being on Breo for about a week. Pt has been using albuterol HFA more. Pt denies cough/SOB/CP/tightness.    Follow-up asthma. Last seen March 2017. This is a routine follow-up. I put him on Breo when she really liked. However the  cost dollars out of pocket because he is in the donut hole. So he ran out of it over a week ago and is having increased symptoms. Asthma control questionnaire is 2.0 and still within his range from prior visits. Exhaled nitric oxide is just over 25 suggests some amount of active airway inflammation. His wife is here with him and she says that she got approved for the [facility] patient assistance program but apparently between our office in the mailroom the paperwork has been loss. She is willing to redo the paperwork and she is wondering if I could sign my part of it this week.  In terms of symptoms specifically tells me that he does wake up a few times at night. When he wakes up he has very mild symptoms. Slightly limited in his activities because of asthma. He has moderate amount of shortness of breath with exertion. He does wheeze a little of the time and is using albuterol for rescue at least one or 2 times daily. 5 point score is 2/.0 showing activity - is worse this past week or so is within normal   OV 01/04/2017   Chief Complaint  Patient presents with   Follow-up    Pt states last week he had an 'asthma attack' and went to ED in [place] and was given steroid and abx per pt's wife. Pt states he feels he is improving. Pt states he does not feel back to baseline. Pt denies  CP/tightness and f/c/s.     Follow-up moderate persistent asthma  Last seen June 2017. He presents with his wife as always. He is on Dulera through the assistance program. Some 3 weeks ago his sister died from terminal cancer. Take a flight to [place]. His been dealing with those emotions. Because of the travel, emotions and weather change some 10 days ago he had an asthma exacerbation but never below few days with increased cough and wheezing. He went to a local emergency department and got one shot of steroids and was given 7 days of cephalexin which is completed. Despite this is not fully better. He still has some cough  and wheeze and yellow sputum. There is no fever.   OV 07/07/2017  Chief Complaint  Patient presents with   Follow-up    Pt using Dulera, still having trouble getting this covered by insurance and getting Financial Assistance/Patient Assistance. Pt states that his breathing is doing better after senc round of Abx/Prednisone.      Follow-up moderate persistent asthma   Last seen February 2018. He presents with his wife. Overall he is doing stable except with the summer heat his symptoms are slightly more than usual as seen in the asthma control questionnaire. He says that he does not wake up at night because of asthma. When he wakes up he has mild symptoms. He has limitation in his activities because of asthma but this could also be because of obesity and DJD and spine issues. He does get dyspneic for moderate amount of the time and is wheezing a lot of the time but using albuterol for rescue 1-2 puffs per day. He's having significant difficulty getting [employer] to pay for his assistance program and is in need of samples today. He continues on Singulair.   OV 12/09/2017  Chief Complaint  Patient presents with   Follow-up    shortness of breath   Follow-up moderate persistent asthma  Routine 35-monthfollow-up.  He presents with his wife.  Overall he is doing fine.  He has significant symptoms on his asthma control questionnaire.  In fact his symptoms are slowly deteriorated.  Average score is 2.8.  But this resolved mostly shortness of breath.  He feels is due to obesity.  His nitric oxide is 12 showing good asthma control on Dulera and Singulair.  He plans to lose weight.  Recently had a fall and hurt the shoulder.  Review of the images show is not had a chest x-ray since 2010.  He is up-to-date with his flu shot.  In terms of his asthma control questionnaire he hardly ever wakes up at night.  When he wakes up he has moderate symptoms.  He is limited with his activities moderately and does express  a moderate amount of shortness of breath but he does wheeze occasionally.  He is using albuterol for rescue multiple times a day.  =  OV 08/05/2018  Subjective:  Patient ID: MLupita Bernard male , DOB: [birth year], age 77y.o. , MRN: [id number], [place] [address] [id number]  08/05/2018 -   Chief Complaint  Patient presents with   Follow-up    Pt has been sick and was put on prednisone and abx which he began Monday, 9/2.  Pt has c/o cough with green mucus, increased SOB. Denies any CP or chest tightness.     HPI MTREMOND SHIMABUKURO89y.o. -moderate persistent asthma patient here for follow-up. He is here  with his wife. They tell me that has been many months since he has taken his inhale corticosteroid Dulera because the patient assistance program has been slow to respond.He is relying on sampples. Apparently got some Symbicort from usat some point in the past since he last saw me. However he has run out of that as well. He is not sure how long it is since he ran out of that. So he is having a flareup. He is just dependent on prednisone for the last 5 days with antibiotic symptoms by our nurse practitioner.He does not feel fully back to baseline. His asthma control question is 3.2 showing significant nocturnal symptoms or albuterol rescue use OF breath and wheezing. In fact on exam he had some wheezing is exhaled nitric oxide was 28 while on prednisone       OV 11/11/2018  Subjective:  Patient ID: Keith Bernard, male , DOB: [birth year] , age 78 y.o. , MRN: [id number] , ADDRESS: [place] [id number]   11/11/2018 -   Chief Complaint  Patient presents with   Follow-up    f/u asthma. Pt states he has had a recent flare up, wheezing has increased.   Moderate persistent asthma for follow-up.  He is on [place] and Singulair  HPI WASIL WOLKE 77 y.o. -for the last 3 days he is reporting increased cough with congestion and some mild increase  in wheezing with green sputum.  Asthma control questionnaire shows that he is waking up a few times at night.  When he wakes up he is quite severe symptoms he is very limited in his activities because of asthma experiencing a very great deal of shortness of breath and wheezing all the time using albuterol for rescue 5 times daily.  However nitric oxide test is normal.  Clinically is not wheezing actively.  Recently had edema and my nurse practitioner started him on Lasix.  He tells me there are a bunch of cats at home and also significant mold exposure.  The wife is interested in allergy testing but they do not have the financial resources to get rid of the cats or get rid of the mold to sell the house.  They are frustrated by all this.  He says today he will be content taking antibiotic and if it gets worse doing prednisone.  Last chest x-ray October 2019.  He is never had CT chest.       OV 09/19/2019  Subjective:  Patient ID: ZENON LEAF, male , DOB: Nov 02, 1946 , age 22 y.o. , MRN: [id number] , ADDRESS: [place] [id number]   09/19/2019 -   follow-up obstructive lung disease.  To person identifier used to identify the patient on this telephone visit.  Risks, benefits and limitations explained.  Moderate persistent asthma for follow-up.  He is on Dulera and Singulair History of exposure to the mold   HPI JOHNATHIN VANDERSCHAAF 77 y.o. -presents for this telephone visit.  He says he is doing well.  Socially distancing and trying to avoid COVID-19.  The main purpose of the call is that he wants a refill on the Singulair and was told to make a telephone visit.  He says his symptoms are really well controlled.  Last year he got rid of the mold in the house.  He save money and got this done.  He still has some kittens with is not bothering him.  He says her asthma is  really much better after getting rid of the mold.  He continues on Dulera and Singulair.  Is up-to-date with his  flu shot.  He has some baseline shortness of breath and neuropathy.  He is compliant with his CPAP for sleep apnea.  There are no other new problems     ROS - per HPI     OV 05/07/2020  Subjective:  Patient ID: WISSAM RESOR, male , DOB: September 24, 1946 , age 19 y.o. , MRN: [id number] , ADDRESS: [place] [id number]   05/07/2020 -   Chief Complaint  Patient presents with   Follow-up    Pt states he has been doing okay since last visit. Pt states that he does become SOB due to being overweight.   Moderate persistent asthma for follow-up.  He is on Dulera and Singulair History of exposure to the mold - cleaned house spring 2021   HPI DOLPH TAVANO 77 y.o. -returns for follow-up.  He presents with his wife.  Wife is giving most of the history and is having to redirect the husband.  As best as I can gather it appears the asthma is well controlled.  However the ACT score is 14 suggesting poor control.  In talking to him I find out that he is using his albuterol multiple times a day.  He thinks this is a habit but also is using it because of sore throat.  He also states that he is also using it for shortness of breath.  He is not having any nocturnal symptoms of wheezing.  He just uses albuterol randomly at the daytime.  However wife and he overall feels asthma is well controlled with Dulera and Singulair.  They believe the Ruthe Mannan is a life saver.  Asking for charity support of filling a form for [facility].  In terms of his mold exposure he has gotten rid of the mold.  He feels masking and social distancing has helped his respiratory quality of life.   OV 11/13/2020   Subjective:  Patient ID: Keith Bernard, male , DOB: 12-16-1945, age 88 y.o. years. , MRN: [id number],  ADDRESS: [place] [id number] PCP  [name], MD Providers : Treatment Team:  Attending Provider: Brand Males, MD Patient Care Team: [name], MD as PCP -  General (Family Medicine) [name], MD as Consulting Physician (Cardiology)    Chief Complaint  Patient presents with   Follow-up    Patient is hoarse all the time and they are concerned about it. Patient states he would like to stay on Breztri. Both feet seem to be swollen, patient states that he is tired all the time and sleeps a lot during the day.     Moderate persistent asthma for follow-up.  He is on Dulera and Singulair> changed toBREZTRI summer/fall 2021 History of exposure to the mold - cleaned house spring 2021 Elevated IgE level June 2021 with eosinophils 200 cells per cubic millimeter June 2021  OSA on CPAP Morbid obesity uses a cane Early interstitial lung abnormalities on September 2021 high-resolution CT chest Elevation of left hemidiaphragm noted on September 2021 CT chest   HPI SHAUNAK KREIS 77 y.o. -returns for follow-up with his wife.  Overall he feels stable.  His biggest issue is that over the last few to several months he has had worsening of his chronic intermittent hoarseness of voice.  It is now persistent.  He states one of his  family relatives had vocal cord nodules.  Therefore he is concerned.  He feels his asthma is well controlled with his current inhaler regimen.  He is now on triple inhaler therapy.  He says it took a while for him to get used to it but he says he likes it now.  In terms of his sleep apnea is continuing on CPAP.  After last visit I checked a blood IgE and eosinophils.  His IgE slightly elevated.  His high-resolution CT chest shows evidence of early interstitial lung abnormalities but no clear-cut evidence of ILD/pulmonary fibrosis in my opinion.  Despite feeling his asthma is under control he complains of shortness of breath.  This is probably related to his obesity.  He gets dyspneic when he bends down or when he walks.  He wants to see a nutritionist or undergo some kind of a program where he could lose weight.  Of note: They  wanted me to help filling out the form for charity application for his inhaler therapy from [place].  The program is called [place] and [place]  Asthma Control Test ACT Total Score  05/07/2020 14    Lab Results  Component Value Date   NITRICOXIDE 11 11/11/2018     CT chest HRCT Sept 2021  IMPRESSION: 1. 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 2. Faint patchy ground-glass opacity in the peripheral perilobular right lung base, nonspecific, with the differential including nonspecific minimal postinfectious/postinflammatory scarring versus very mild interstitial lung disease such as NSIP. Consider follow-up high-resolution chest CT study in 12 months. Findings are suggestive of an alternative diagnosis (not UIP) per consensus guidelines: Diagnosis of Idiopathic Pulmonary Fibrosis: An Official ATS/ERS/JRS/ALAT Clinical Practice Guideline. Pierce, Iss 5, [phone], Jul 31 2017. 3. Two-vessel coronary atherosclerosis. 4. Aortic Atherosclerosis (ICD10-I70.0).     Electronically Signed   By: Ilona Sorrel M.D.   On: 08/12/2020 16:03    OV 07/16/2021  Subjective:  Patient ID: Keith Bernard, male , DOB: Mar 19, 1946 , age 77 y.o. , MRN: [id number] , ADDRESS: [place] [id number] PCP [name], MD Patient Care Team: [name], MD as PCP - General (Family Medicine) [name], MD as Consulting Physician (Cardiology)  This Provider for this visit: Treatment Team:  Attending Provider: Brand Males, MD    07/16/2021 -   Chief Complaint  Patient presents with   Follow-up    Pt states throat hurting because of brezatri inhaler, pt states tongue hurting also has hoarseness.    Moderate persistent asthma for follow-up.  He is on Dulera and Singulair> changed toBREZTRI summer/fall 2021 History of exposure to the mold - cleaned house  spring 2021 Elevated IgE level June 2021 with eosinophils 200 cells per cubic millimeter June 2021  OSA on CPAP Morbid obesity uses a cane Early interstitial lung abnormalities on September 2021 high-resolution CT chest Elevation of left hemidiaphragm noted on September 2021 CT chest  HPI RAIYAN DALESANDRO 77 y.o. -he is now on BREZTRI since last year.  This is because of co-pay issues.  He is on a [employer] fast streamer.  He is not able to do Symbicort because it is no longer available on the free program.  It is generic.  He is not even able to afford albuterol he uses his family's albuterol.  The  BREZTRI is controlling his asthma well but he has significant  hoarseness of voice.  In fact compared to last him the hoarseness is gotten worse.  Wife tells me that he is extremely worried about vocal cord cancer.  He has seen to ear nose throat doctors.  Including Dr. Constance Holster was done cultures on him.  Apparently has been reassured.  He also feels reassured there is no vocal cord cancer.  He believes the inhaler is causing the hoarseness of voice.  But at this point in time he wants to just soldier on with current inhaler because of the cost issues.  Of note a year ago his high-resolution CT chest showed some interstitial abnormalities.  His 1 year CT scan follow-up is due September 2022.  We will postpone this.  This because he is feeling stable from a breathing standpoint shortness of breath standpoint.   Asthma Control Test ACT Total Score  07/16/2021 10  05/07/2020 14     Lab Results  Component Value Date   NITRICOXIDE 11 11/11/2018           OV 09/18/2021  Subjective:  Patient ID: Keith Bernard, male , DOB: 08-04-1946 , age 62 y.o. , MRN: [id number] , ADDRESS: [place] [id number] PCP [name], MD Patient Care Team: [name], MD as PCP - General (Family Medicine) [name], MD as Consulting Physician (Cardiology)  This Provider  for this visit: Treatment Team:  Attending Provider: Brand Males, MD    09/18/2021 -   Chief Complaint  Patient presents with   Follow-up    Pt is here to discuss the results of recent CT.  Pt is more SOB compared to last visit.    Moderate persistent asthma for follow-up.  He is on Dulera and Singulair> changed toBREZTRI summer/fall 2021 History of exposure to the mold - cleaned house spring 2021 Elevated IgE level June 2021 with eosinophils 200 cells per cubic millimeter June 2021  OSA on CPAP Morbid obesity uses a cane Early interstitial lung abnormalities on September 2021 high-resolution CT chest -> no change Aug 2022 Elevation of left hemidiaphragm noted on September 2021 CT chest Grade 2 diastolic dysfunction on echocardiogram November 2019  HPI LEXIE MORINI 77 y.o. -last seen in August 2022.  He had tolerated Symbicort well in the past but he could not afford it.  He was I think initially able to get BREZTRI through charity program from the pharmaceutical but then it was causing hoarseness of voice.  He then resorted to just taking it as needed.  He is not taking it anymore.  He says he is not eligible for the charity.  He has a deceased family member who had Spiriva and Wixela.  He is wondering whether to take it although he is reluctant to take Spiriva because of the dry powder inhaler and cause hoarseness.  But then his wife states he is getting worsening shortness of breath.  We walked him and he walks slowly and Talking although he had some mild shortness of breath.  He did show some slight tendency to desaturate.  But overall did well.  Nevertheless the wife thinks that he is short of breath for minimal activities and it is worse.  He did have a repeat high-resolution CT chest that still shows the early interstitial lung abnormalities.  It is unchanged.  We briefly discussed the concept of Biologics again.  They are interested.      Asthma Control Test ACT Total  Score  07/16/2021 10  05/07/2020  14     Lab Results  Component Value Date   NITRICOXIDE 11 11/11/2018      CT Chest data HRCT 07/30/21  Narrative & Impression  CLINICAL DATA:  77 year old male with history of shortness of breath.   EXAM: CT CHEST WITHOUT CONTRAST   TECHNIQUE: Multidetector CT imaging of the chest was performed following the standard protocol without intravenous contrast. High resolution imaging of the lungs, as well as inspiratory and expiratory imaging, was performed.   COMPARISON:  Chest CT 08/12/2020.   FINDINGS: Cardiovascular: Heart size is normal. There is no significant pericardial fluid, thickening or pericardial calcification. There is aortic atherosclerosis, as well as atherosclerosis of the great vessels of the mediastinum and the coronary arteries, including calcified atherosclerotic plaque in the left main, left anterior descending and right coronary arteries.   Mediastinum/Nodes: No pathologically enlarged mediastinal or hilar lymph nodes. Please note that accurate exclusion of hilar adenopathy is limited on noncontrast CT scans. Esophagus is unremarkable in appearance. No axillary lymphadenopathy.   Lungs/Pleura: High-resolution images demonstrates some very mild ground-glass attenuation and septal thickening, most evident in the periphery of the right lower lobe. No parenchymal banding, traction bronchiectasis or frank honeycombing. Inspiratory and expiratory imaging demonstrates some mild air trapping indicative of small airways disease. Chronic elevation of the left hemidiaphragm again noted. No acute consolidative airspace disease. No pleural effusions. No suspicious appearing pulmonary nodules or masses are noted.   Upper Abdomen: Aortic atherosclerosis. Status post cholecystectomy. Splenule incidentally noted adjacent to the medial aspect of the upper spleen.   Musculoskeletal: There are no aggressive appearing lytic or  blastic lesions noted in the visualized portions of the skeleton.   IMPRESSION: 1. There are very subtle changes in the lungs, most evident in the right lower lobe, which could be indicative of early or mild interstitial lung disease. This is stable compared to the prior study, and categorized as indeterminate for usual interstitial pneumonia (UIP) per current ATS guidelines. 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 2. Aortic atherosclerosis, in addition to left main and 2 vessel coronary artery disease. Assessment for potential risk factor modification, dietary therapy or pharmacologic therapy may be warranted, if clinically indicated.   Aortic Atherosclerosis (ICD10-I70.0).     Electronically Signed   By: Vinnie Langton M.D.   On: 07/31/2021 21:25    No results found.   10/15/2021 Pt. Presents for follow up. Last seen 08/2021 with complaints of worsening dyspnea on exertion. He has elevated IgE, and Eosinophils. He has been having trouble with inhalers as they cause hoarseness, and [name] spoke with patient and his wife about  biologics 09/20/2021. 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 I will refer to [facility] Department to see if he needs treatment. He is doing better on the [place] and Spiriva. We will maintain this treatment .  Labs were discussed. He has what appears to be a chronic anemia, which [name] has asked him to follow up with PCP about.  As he is better on the inhalers, patient and his wife are happy to continue this regimen. Thy understand biologics may be a good option  if current regimen no longer controls his dyspnea. He is very deconditioned and has chronic anemia , and both are impacting his dyspnea.   Pt has developed  lower extremity edema. Echo shows   Test  Results: Labs drawn 09/19/2021 and reviewed personally by me: ANA 1:80>> Elevated>> Dense Fine Speckled CBC Latest Ref Rng & Units 09/19/2021 01/10/2021 05/07/2020  WBC 4.0 - 10.5 K/uL 7.8 6.7 7.4  Hemoglobin 13.0 - 17.0 g/dL 11.6(L) 12.8(L) 11.9(L)  Hematocrit 39.0 - 52.0 % 35.7(L) 40.5 35.9(L)  Platelets 150.0 - 400.0 K/uL 214.0 217 231.0   ds DNA Ab IU/mL <1   IgE 370 ( 235 one  year ago)  Quantiferon Gold>> Positive ( Has had a + TB exposure in the past as family member exposed him when he was a child.) Cyclic Citrullin Peptide Ab UNITS <16    Rhuematoid fact SerPl-aCnc <14 IU/mL <14    Prolactin 2.0 - 18.0 ng/mL 9.4   Elevated IgE level June 2021 with eosinophils 200 cells per cubic millimeter June 2021   Echo 08/2021 IMPRESSIONS Left ventricular ejection fraction, by estimation, is 60 to 65%. The left ventricle has normal function. The left ventricle has no regional wall motion abnormalities. There is mild left ventricular hypertrophy. Left ventricular diastolic parameters are consistent with Grade I diastolic dysfunction (impaired relaxation). Elevated left atrial pressure. 2. Right ventricular systolic function is normal. The right ventricular size is normal. 3. Left atrial size was mildly dilated. The mitral valve is normal in structure. Trivial mitral valve regurgitation. No evidence of mitral stenosis. 4. The aortic valve is tricuspid. Aortic valve regurgitation is not visualized. No aortic stenosis is present. 5. The inferior vena cava is normal in size with greater than 50% respiratory variability, suggesting right atrial pressure of 3 mmHg.      09/2021: [name] with Elie Confer NP.  He had complaints of worsening dyspnea upon exertion.  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  He was referred to public health department to see if he needed treatment.  Very deconditioned and has chronic  anemia, both of which are impacting his dyspnea.  Has had some issues with inhalers causing hoarseness but was doing well on Wixela and Spiriva.  Discussed the role of Biologics in the future.   03/04/2022: OV with [name].  Reported that he was currently forgetting Spiriva and just doing Wixela.  Wife requested change to Trelegy due to simplicity and wanting triple regimen.  Otherwise well and not having any acute symptoms.  06/17/2022: OV with Cobb NP for worsening shortness of breath over the past three weeks.  He called in the office earlier this week reporting that he had been using his albuterol inhaler 4-6 times a day.  He was having severe anxiety because of the shortness of breath and did not even want to leave the house.  He was not sure he could even wait until his office visit.  He was recommended to go to the emergency room.  Today, he reports that he ended up not going.  He still is having increased shortness of breath when compared to his baseline, which has been going on for the past 3 weeks; however, upon further discussion, appears that his dyspnea has been progressively declining over the last few months.  He also has a productive cough that started around 3 weeks ago as well.  He has been having green sputum production.  He has been wheezing.  He does get some relief with albuterol use.  He is concerned that he may need oxygen.  He does occasionally have some lower extremity edema.  He was previously told by cardiology that this is related to his neuropathy.  He has some feelings of generalized weakness and feels like he is not able to do activities as he used to.  He denies any fevers, chills, hemoptysis, chest pain, palpitations, anorexia, weight loss.  No PND or orthopnea.  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  He never followed up with the health department after his last visit.  He is  currently using Wixela for maintenance; had some issues with getting Trelegy previously.  He would like to see if he could go back on this.  He also takes Singulair at bedtime for trigger prevention. Treated for possible acute asthma exacerbation with prednisone and empiric doxycycline; unclear if this is the exact cause of his symptoms but he had evidence of bronchospasm on exam and bronchitic changes on CXR. Labs with positive d dimer and elevated but stable BNP; CTA chest ordered - negative for PE. Left hilar density - instructed to complete doxycycline.   07/22/2022: OV with Cobb NP for follow-up with his wife after being treated for possible asthma exacerbation with prednisone and doxycycline.  He also had a left hilar density, concerning for possible pneumonia.  He has completed all of his medications and reports feeling significantly better.  Breathing has improved. Cough has cleared up and hasn't noticed wheezing recently.  He has noticed that he has been a little more hoarse since being started on the Trelegy. Denies any fevers, chills, hemoptysis, chest congestion, increased leg swelling, orthopnea. Change from Trelegy to [facility] with spacer to see if this reduces hoarseness. Oxygen has been stable; hasn't been wearing his supplemental O2 for the past few weeks and has felt like he no longer needs it.  He does sleep in a recliner, which he has done for many years. He is on BiPAP therapy but he has been struggling with his machine and mask recently. His machine will cut off after an hour or so of use. This has been going on for a few months. He also feels like is blows a lot of air and he has a lot of leaks. He's going in for a mask fitting Friday with his DME. Previously managed by Dr. Radford Pax; requested that he switch to [place] for management of his BiPAP since he has been having trouble recently and does not want to wait until his follow up in October. Download with mediocre compliance but also significant  breakthrough events/leaks. Orders sent for new machine with adjusted settings to max IPAP 12; no change to EPAP min 4 and PS 4; educated on importance of compliance.   09/02/2022: Today-follow-up Patient presents today with his wife for follow-up.  He has been struggling with anxiety and panic attacks recently.  He feels like this causes him to have trouble with sleeping at night, especially putting his BiPAP on.  He was feeling pretty claustrophobic with the full facemask.  He recently changed just a few nights ago to the nasal mask.  Feels like this is worked better for him.  He understands that he needs to be using his machine nightly.  His wife tells me that he has a poor sleep schedule.  He will stay up until around 4 AM, then sleep for a little while then wake up and then sleep  on and off throughout the day.  He does not use his BiPAP when napping during the day.  Denies any drowsy driving or morning headaches.  He is followed by a psychiatrist who has been working on adjusting his meds.  He was recently started on hydroxyzine 3 times a day.  He also is on Lexapro and Ativan for anxiety.  He is on Remeron and Ambien to help with sleep at night.  They did recently adjust his Ambien to a slightly lower dose.  He is worried that they were going to stop giving it to him, which has caused more anxiety. From a respiratory standpoint, he feels like his breathing is relatively stable.  He still has a little bit of hoarseness but he has been using the Trelegy still.  He wanted to finish up what he had at home.  He started using the Breztri a few days ago.  He does have an occasional cough, which is unchanged from his baseline.  Denies any increased chest congestion, wheezing, lower extremity swelling.  Rarely uses albuterol.  Takes Nexium for reflux and Singulair for allergies/asthma. Wants to work on weight loss measures.   08/02/2022-08/31/2022: BiPAP IPAP 12 cmH2O, EPAP 4 cmH2O 21/30 days used; 23% > 4 hr;  average usage 3 hours 18 minutes Leaks median 3.1, 95th 27.2 AHI 6.9    TEST/EVENTS:  06/19/2020 PFTs: FVC 62, FEV1 64, ratio 79, TLC 71, DLCOcor 86.  Moderate obstruction with reversibility 07/30/2021 HRCT chest: Atherosclerosis.  No LAD.  There are some areas of very mild groundglass attenuation and septal thickening in the periphery of the right lower lobe.  Stable when compared to previous and indeterminate for UIP.  Most likely postinfectious or inflammatory fibrosis.  Inspiratory and expiratory imaging demonstrate some mild air trapping.  There is chronic elevation of the left hemidiaphragm again noted. 09/26/2021 echocardiogram: EF 60 to 65%.  G1 DD.  Elevated left atrial pressure.  RV size and function is normal.  LA is mildly dilated.  Trivial MR. 06/17/2022 CXR 2 view: There are chronic bronchitic changes; no acute process.  Stable moderate eventration of the left hemidiaphragm. 06/18/2022 CTA chest: No evidence of PE.  Atherosclerosis.  No LAD.  Left basilar airspace disease, possibly atelectasis versus pneumonia.   OV 12/09/2022  Subjective:  Patient ID: INGVALD THEISEN, male , DOB: [birth year] , age 13 y.o. , MRN: [id number] , ADDRESS: [place] [place] [id number] PCP [name], MD Patient Care Team: [name], MD as PCP - General (Family Medicine) [name], MD as Consulting Physician (Cardiology)  This Provider for this visit: Treatment Team:  Attending Provider: Brand Males, MD    12/09/2022 -   Chief Complaint  Patient presents with   Follow-up    Pt gaining a lot of weight recently.  Wants PT near [facility] for weight loss.  Dyspnea with exertion.  Pt using Wixela 250/50 that was family members old meds. 2 puffs twice a day.    Moderate persistent asthma for follow-up.  He is on Dulera and Singulair> changed toBREZTRI summer/fall 2021 History of exposure to the mold - cleaned house spring 2021 Elevated IgE level June 2021 with  eosinophils 200 cells per cubic millimeter June 2021  OSA on CPAP.BiPAP Morbid obesity uses a cane Early interstitial lung abnormalities on September 2021 high-resolution CT chest -> no change Aug 2022 Elevation of left hemidiaphragm noted on September 2021 CT chest Grade 2 diastolic dysfunction on echocardiogram November  2019 QuantiFERON gold + October 2022   HPI CHEIKH BRAMBLE 77 y.o. -presents for follow-up.  Presents with his wife.  Wife is being an independent historian.  Summary is that currently from a respiratory standpoint he is stable.  I saw him in April 2023.  After that he has had multiple visits with the nurse practitioners.  I reviewed all these external records.  Last visit I prescribed BREZTRI.  He and his wife report this inhaler actually helped him.  However affordability is an issue.  They applied for [employer] and they were given 1 month of supply.  This is insufficient.  He is currently using Wixela from a deceased family member.  While this is helping him and is not as good as the triple inhaler therapy.  Wife is also reporting concerns that he goes through his inhalers fast.  I told him to trust the inhaler dosing even and feels no medicine is coming inside.  In the past his blood eosinophils have been borderline elevated.  We reviewed this.  Told him we would retest.  He is open to this idea.  He has been on prednisone a few times in 2023.  He might qualify for Dupixent.  In terms of his OSA is now on BiPAP.  Per my review of nurse petitioner notes he is now seeing our practice  He has QuantiFERON gold positivity and I am not sure that he has seen public health department for this.  He has mild ILA and a CT scan in 2023.  He will have another CT scan follow-up high-resolution in the summer 2024.  Physical deconditioning: He has gained a lot of weight.  He wants referral to a PT program in [place] due to convenience and location.  We will make  that.     Asthma Control Panel -December 2020 -  eos 400 cells per cubic millimeter \-> June 2021 200 cells - IgE 235 elevated June 2021 - obstn with low dlco nov 2016 on PFT - normal IgG, IgM and IgA  -feb 2021   - Alpha 1 is MM 08/23/2015  01/29/2016  05/04/2016  07/07/2017  12/09/2017  08/05/2018  11/11/2018  05/07/2020  07/16/2021  03/04/2022   Current Med Regimen symbicort  Dulera  Off breo for several days.  On dulera 100. Struggling to afford and singulair dulera and singulair dulera and singulair dulra and singulair     ACT - a [employer] test - 4 week. Total score. Max is 25, Lower score is worse.  <19 = poor control        '14 10 15  '$ ACQ 5 point- 1 week. wtd avg score. <1.0 is good control 0.75-1.25 is grey zone. >1.25 poor control. Delta 0.5 is clinically meaningful 3.2 2.2 2.0 2.6 2.8 3.2 4.2     FeNO ppB 34 x '26  13 28 '$ on prednisone 11     FeV1  x  x         Planned intervention  for visit Start dulera + singulair Cont dulera      Con breztril sept 2021     Simple office walk 185 feet x  3 laps goal with forehead probe 09/18/2021    O2 used ra   Number laps completed 2 of 3.   Comments about pace Slow pace, with cane, kept talking   Resting Pulse Ox/HR 98% and 68/min   Final Pulse Ox/HR 92% and 63/min   Desaturated </= 88% no  Desaturated <= 3% points Tesm 6 ooints   Got Tachycardic >/= 90/min no   Symptoms at end of test None to mild   Miscellaneous comments x       PFT     Latest Ref Rng & Units 06/19/2020   12:56 PM 10/17/2015    9:53 AM  PFT Results  FVC-Pre L 2.44  2.22   FVC-Predicted Pre % 62  54   FVC-Post L 2.58  2.34   FVC-Predicted Post % 65  57   Pre FEV1/FVC % % 75  75   Post FEV1/FCV % % 79  80   FEV1-Pre L 1.85  1.66   FEV1-Predicted Pre % 64  55   FEV1-Post L 2.04  1.88   DLCO uncorrected ml/min/mmHg 18.83  19.62   DLCO UNC% % 79  66   DLCO corrected ml/min/mmHg 20.59    DLCO COR %Predicted % 86    DLVA Predicted % 123  121   TLC L 4.78  4.62    TLC % Predicted % 71  69   RV % Predicted % 101  98        has a past medical history of Asthma, Chronic diastolic CHF (congestive heart failure) (HCC), Chronic kidney disease (CKD), stage III (moderate) (HCC), Chronic pain, Coronary artery disease, Depression (05/04/2016), DJD (degenerative joint disease), Dyslipidemia, ETOH abuse, Foot ulcer ([place]) (07/07/2017), GERD (gastroesophageal reflux disease), Hypertension, Neuropathy, Obesity, OSA (obstructive sleep apnea), and RBBB.   reports that he has never smoked. He has never used smokeless tobacco.  Past Surgical History:  Procedure Laterality Date   CORONARY STENT PLACEMENT     REPLACEMENT TOTAL KNEE     left knee    No Known Allergies  Immunization History  Administered Date(s) Administered   DTaP 07/22/2001   Fluad Quad(high Dose 65+) 11/04/2016, 08/26/2020   Influenza Split 09/30/2014   Influenza, High Dose Seasonal PF 11/04/2016, 08/17/2017, 11/03/2018, 09/08/2019   Influenza, Seasonal, Injecte, Preservative Fre 08/30/2017   Influenza,inj,Quad PF,6+ Mos 10/23/2015   Influenza-Unspecified 08/31/2007, 10/12/2013, 09/30/2014, 10/18/2015, 10/23/2015, 08/30/2017   Moderna Sars-Covid-2 Vaccination 02/08/2020, 03/09/2020, 11/14/2020   Pneumococcal Conjugate-13 01/02/2015, 10/01/2015   Pneumococcal Polysaccharide-23 04/11/2012   Pneumococcal-Unspecified 04/11/2012   Tdap 12/22/2016   Zoster Recombinat (Shingrix) 07/21/2017, 01/20/2018   Zoster, Live 01/02/2015    Family History  Problem Relation Age of Onset   COPD Mother    Pancreatic cancer Father        pancreatic    Other Cousin        Amyloidosis     Current Outpatient Medications:    albuterol (PROVENTIL) (2.5 MG/3ML) 0.083% nebulizer solution, Take 3 mLs (2.5 mg total) by nebulization every 6 (six) hours as needed for wheezing or shortness of breath., Disp: 75 mL, Rfl: 5   albuterol (VENTOLIN HFA) 108 (90 Base) MCG/ACT inhaler, INHALE 2 PUFFS INTO THE LUNGS EVERY 6  HOURS AS NEEDED FOR WHEEZING OR SHORTNESS OF BREATH, Disp: 18 g, Rfl: 6   aspirin 81 MG tablet, Take 81 mg by mouth daily., Disp: , Rfl:    atorvastatin (LIPITOR) 20 MG tablet, TAKE 1 TABLET BY MOUTH ONCE  DAILY, Disp: 90 tablet, Rfl: 0   Budeson-Glycopyrrol-Formoterol (BREZTRI AEROSPHERE) 160-9-4.8 MCG/ACT AERO, Inhale 2 puffs into the lungs in the morning and at bedtime., Disp: 1 each, Rfl: 0   CALCIUM PO, Take by mouth 2 (two) times daily. , Disp: , Rfl:    Cholecalciferol (VITAMIN D3) 1000 units CAPS, Take 1,000  Units by mouth., Disp: , Rfl:    CO ENZYME Q-10 PO, Take by mouth daily. , Disp: , Rfl:    Docusate Sodium (DSS) 100 MG CAPS, Take by mouth., Disp: , Rfl:    escitalopram (LEXAPRO) 20 MG tablet, Take 20 mg by mouth daily., Disp: , Rfl:    esomeprazole (NEXIUM) 40 MG capsule, Take 40 mg by mouth daily before breakfast., Disp: , Rfl:    fenofibrate 160 MG tablet, TAKE 1 TABLET BY MOUTH DAILY  WITH FOOD, Disp: 90 tablet, Rfl: 3   furosemide (LASIX) 20 MG tablet, TAKE 1 TABLET BY MOUTH EVERY  OTHER DAY, Disp: 90 tablet, Rfl: 3   LORazepam (ATIVAN) 1 MG tablet, Take 1 mg by mouth 2 (two) times daily., Disp: , Rfl:    Magnesium 500 MG TABS, Take 1 tablet by mouth daily., Disp: , Rfl:    metoprolol tartrate (LOPRESSOR) 25 MG tablet, TAKE 1 TABLET BY MOUTH TWICE  DAILY, Disp: 180 tablet, Rfl: 3   mirtazapine (REMERON) 15 MG tablet, Take 15 mg by mouth at bedtime., Disp: , Rfl:    Misc Natural Products (COLON CLEANSE PO), Take by mouth. , Disp: , Rfl:    Misc. Devices MISC, [place] brace. He has a double upright brace that is no longer enough support for the arthritis and instability of his midfoot and ankle on his right.  It is in lieu of surgical correction., Disp: , Rfl:    montelukast (SINGULAIR) 10 MG tablet, TAKE 1 TABLET BY MOUTH AT  BEDTIME, Disp: 90 tablet, Rfl: 3   nitroGLYCERIN (NITROSTAT) 0.4 MG SL tablet, Place 1 tablet (0.4 mg total) under the tongue every 5 (five) minutes as  needed., Disp: 25 tablet, Rfl: 3   oxyCODONE HCl 7.5 MG TABS, Take 7.5 mg by mouth every 6 (six) hours as needed., Disp: , Rfl:    Spacer/Aero-Holding Chambers (AEROCHAMBER MV) inhaler, Use as instructed, Disp: 1 each, Rfl: 0   tamsulosin (FLOMAX) 0.4 MG CAPS capsule, Take 0.4 mg by mouth daily. , Disp: , Rfl:    Testosterone 12.5 MG/ACT (1%) GEL, Apply 1 Squirt topically daily., Disp: 75 g, Rfl: 0   thiamine 100 MG tablet, Take by mouth., Disp: , Rfl:    triamcinolone (NASACORT) 55 MCG/ACT AERO nasal inhaler, 2 puffs in each nostril, Disp: , Rfl:    vitamin B-12 (CYANOCOBALAMIN) 1000 MCG tablet, Take 1,000 mcg by mouth daily., Disp: , Rfl:    zolpidem (AMBIEN) 10 MG tablet, Take 5 mg by mouth at bedtime as needed for sleep., Disp: , Rfl:    Budeson-Glycopyrrol-Formoterol (BREZTRI AEROSPHERE) 160-9-4.8 MCG/ACT AERO, Inhale 2 puffs into the lungs in the morning and at bedtime. (Patient not taking: Reported on 12/09/2022), Disp: 10.7 g, Rfl: 6   Budeson-Glycopyrrol-Formoterol (BREZTRI AEROSPHERE) 160-9-4.8 MCG/ACT AERO, Inhale 2 puffs into the lungs in the morning and at bedtime. (Patient not taking: Reported on 12/09/2022), Disp: 10.7 g, Rfl: 0   Budeson-Glycopyrrol-Formoterol (BREZTRI AEROSPHERE) 160-9-4.8 MCG/ACT AERO, Inhale 2 puffs into the lungs in the morning and at bedtime. (Patient not taking: Reported on 12/09/2022), Disp: 5.9 g, Rfl: 0      Objective:   Vitals:   12/09/22 1417  BP: 126/74  Pulse: 60  Temp: 98.4 F (36.9 C)  TempSrc: Oral  SpO2: 94%  Weight: 292 lb 6.4 oz (132.6 kg)  Height: '5\' 9"'$  (1.753 m)    Estimated body mass index is 43.18 kg/m as calculated from the following:   Height as of  this encounter: '5\' 9"'$  (1.753 m).   Weight as of this encounter: 292 lb 6.4 oz (132.6 kg).  '@WEIGHTCHANGE'$ @  Filed Weights   12/09/22 1417  Weight: 292 lb 6.4 oz (132.6 kg)     Physical Exam General: No distress. obes Neuro: Alert and Oriented x 3. GCS 15. Speech normal Psych:  Pleasant Resp:  Barrel Chest - no.  Wheeze - no, Crackles - no, No overt respiratory distress CVS: Normal heart sounds. Murmurs - no Ext: Stigmata of Connective Tissue Disease - no HEENT: Normal upper airway. PEERL +. No post nasal drip        Assessment:       ICD-10-CM   1. Severe persistent asthma without complication  D74.12 CT Chest High Resolution    CBC w/Diff    CBC w/Diff    CANCELED: CBC w/Diff    CANCELED: CT Chest High Resolution    2. DOE (dyspnea on exertion)  R06.09 CT Chest High Resolution    CANCELED: CT Chest High Resolution    3. Physical deconditioning  R53.81 Ambulatory referral to Physical Therapy         Plan:     Patient Instructions  Moderate persistent asthma without complication with elevated IgE and blood eos    - clinically well controlled currently -  wixela working well but is family sample,  - [name] is preferred but you need [place] charity - recurrentl flare up history  Plan - check cbc with diff - if eos elevated can consider dupixent - cotninue wixela for now  - CMA to refer you to pharmacist for [place] for breztri -take 2 samples of breztri - take 2 puff twice daily - should last a month  OSA onBiPPA - previously Dr Radford Pax. Since fall 2023: Pulmonary  - cotninue BiPAP through sleep doc  QUantiferon GOld positive Oct 2022  Plan  -ddi you go to public health - if not please do  Abnormal CT of the chest Sept 2021 and sumer 2023- early ILA otherwise call interstitial lung abnormalities  - still persistent summer 2023 -   Plan -do repeat HRCT supine and prone in 6 months; summer 2024   [place] Morbid Obesity Physical DEconditioning  Plan  - refer PT in [place]  Follow-up - will call with blood work results -6 months but after HRCT; sooner if needed; 30 min visit   (Level 04: Estb 30-39 min visit type: on-site physical face to visit visit spent in total care time and counseling or/and coordination  of care by this undersigned MD - Dr Brand Males. This includes one or more of the following on this same day 12/09/2022: pre-charting, chart review, note writing, documentation discussion of test results, diagnostic or treatment recommendations, prognosis, risks and benefits of management options, instructions, education, compliance or risk-factor reduction. It excludes time spent by the [place] or office staff in the care of the patient . Actual time is 32 min)  SIGNATURE    Dr. Brand Males, M.D., F.C.C.P,  Pulmonary and Critical Care Medicine Staff Physician, [place] Director - Interstitial Lung Disease  Program  [facility] [place] [facility] [place], [place], [address]  Pager: [phone], If no answer or between  15:00h - 7:00h: call [phone] Telephone: [phone]  5:27 PM 12/09/2022

## 2022-12-09 NOTE — Patient Instructions (Addendum)
Moderate persistent asthma without complication with elevated IgE and blood eos    - clinically well controlled currently -  wixela working well but is family sample,  - [name] is preferred but you need [place]& [place] charity - recurrentl flare up history  Plan - check cbc with diff - if eos elevated can consider dupixent - cotninue wixela for now  - CMA to refer you to pharmacist for [place] for breztri -take 2 samples of breztri - take 2 puff twice daily - should last a month  OSA onBiPPA - previously Dr Radford Pax. Since fall 2023: Pulmonary  - cotninue BiPAP through sleep doc  QUantiferon GOld positive Oct 2022  Plan  -ddi you go to public health - if not please do  Abnormal CT of the chest Sept 2021 and sumer 2023- early ILA otherwise call interstitial lung abnormalities  - still persistent summer 2023 -   Plan -do repeat HRCT supine and prone in 6 months; summer 2024   [place] Morbid Obesity Physical DEconditioning  Plan  - refer PT in [place]  Follow-up - will call with blood work results -6 months but after HRCT; sooner if needed; 30 min visit

## 2022-12-10 LAB — CBC WITH DIFFERENTIAL/PLATELET
Basophils Absolute: 0.1 10*3/uL (ref 0.0–0.1)
Basophils Relative: 1.1 % (ref 0.0–3.0)
Eosinophils Absolute: 0.3 10*3/uL (ref 0.0–0.7)
Eosinophils Relative: 4.1 % (ref 0.0–5.0)
HCT: 38.9 % — ABNORMAL LOW (ref 39.0–52.0)
Hemoglobin: 12.4 g/dL — ABNORMAL LOW (ref 13.0–17.0)
Lymphocytes Relative: 22.9 % (ref 12.0–46.0)
Lymphs Abs: 1.5 10*3/uL (ref 0.7–4.0)
MCHC: 31.9 g/dL (ref 30.0–36.0)
MCV: 91.7 fl (ref 78.0–100.0)
Monocytes Absolute: 0.7 10*3/uL (ref 0.1–1.0)
Monocytes Relative: 10.7 % (ref 3.0–12.0)
Neutro Abs: 4.1 10*3/uL (ref 1.4–7.7)
Neutrophils Relative %: 61.2 % (ref 43.0–77.0)
Platelets: 247 10*3/uL (ref 150.0–400.0)
RBC: 4.24 Mil/uL (ref 4.22–5.81)
RDW: 15 % (ref 11.5–15.5)
WBC: 6.7 10*3/uL (ref 4.0–10.5)

## 2022-12-14 ENCOUNTER — Telehealth: Payer: Self-pay | Admitting: Internal Medicine

## 2022-12-14 NOTE — Telephone Encounter (Signed)
Blood eos are high 300cell/cum mm -> given recurrent flare up -> refer for fasenra

## 2022-12-14 NOTE — Telephone Encounter (Signed)
Please re-read message from front staff. Pt no longer needs any meds.

## 2022-12-14 NOTE — Telephone Encounter (Signed)
Called patient's wife Loletha Carrow but she did not answer. Left message for her to call us back.

## 2022-12-15 NOTE — Telephone Encounter (Signed)
Called wife   On 12/09/22 after seeing me in office -> on way stopped and got some sandwich - > fter that had nausea. Then on 14-Dec-2022 had more GI complaints (upper). -> then sat to watch TV with wife and never wok up. Just past 9pm wife realized he was dead. HE is waiting for cremation  RIP Expressed my condolences   Ddx MI/PE    SIGNATURE    Dr. Brand Males, M.D., F.C.C.P,  Pulmonary and Critical Care Medicine Staff Physician, [place] Director - Interstitial Lung Disease  Program  Medical Director - [place] [facility] [place] [facility] [place], [place], [address]   Pager: [phone], If no answer  -[place] or [place] [phone] Telephone (clinical office): [phone] Telephone (research): [phone]  4:32 PM 12/15/2022

## 2022-12-15 NOTE — Telephone Encounter (Signed)
Keith Bernard returned the call. She wanted to let [name] know how much she appreciated everything he had done for [name]. She did confirm that he passed away last March 10, 2023.   She was concerned about the AZ&[place] application that was submitted last week. I advised her that I could call AZ[employer] and cancel the application.   I have marked patient as deceased.    Will prepare a condolence card for patient.

## 2022-12-28 ENCOUNTER — Telehealth: Payer: Self-pay | Admitting: Pediatric Endocrinology

## 2022-12-28 NOTE — Telephone Encounter (Signed)
PT wife still getting meds by mail with no call back #. She has spoken to [place] before but they are still coming. Pls call to advise how to stop and if she can donate the unopened box to [place]. # on file good.

## 2022-12-28 NOTE — Telephone Encounter (Signed)
Called and spoke with pt's spouse Vickie who states that pt has received Breztri medication from [facility] pt assistance pharmacy. Vickie stated that the [name] is unopened and since pt has passed, they no longer need it.  Stated to [facility] that she could bring the inhalers to our office so we could see if we could give them to a pt since they were unopened and she verbalized understanding. Told her I would try to call [facility] to have Rx cancelled and she verbalized understanding.   Called [facility] and had them cancel pt's Breztri application as pt has passed away. Application has been cancelled. Nothing further needed.

## 2022-12-31 DEATH — deceased

## 2023-01-15 ENCOUNTER — Ambulatory Visit: Payer: Medicare Other | Admitting: Cardiology

## 2023-04-09 IMAGING — CT CT CHEST HIGH RESOLUTION W/O CM
2 of 7 series · 14 of 36 positions shown, 17 images · non-contrast
Comparison: Chest CT 08/12/2020.

CLINICAL DATA: 74-year-old male with history of shortness of
breath.

EXAM:
CT CHEST WITHOUT CONTRAST
TECHNIQUE: Multidetector CT imaging of the chest was performed following the
standard protocol without intravenous contrast. High resolution
imaging of the lungs, as well as inspiratory and expiratory imaging,
was performed.

[Series 4: high resolution retro · axial · 0.79mm/px · z∈[+1473,+1747]mm · 11 of 330 slices shown, 14 images]
[im 28/330  mediastinal]
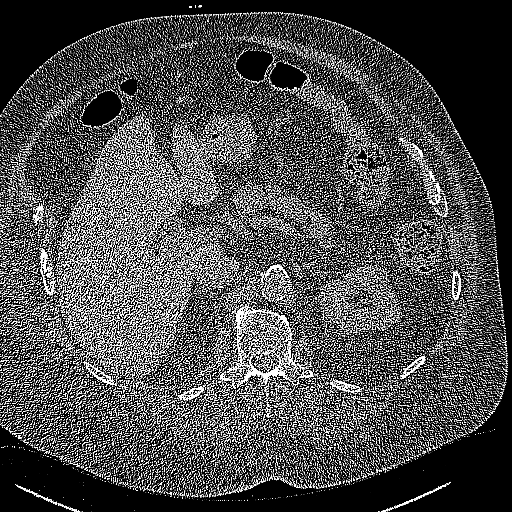
[im 28/330  lung]
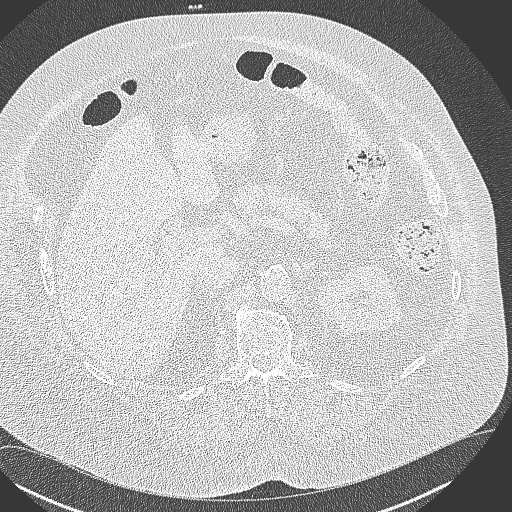
[im 55/330  lung]
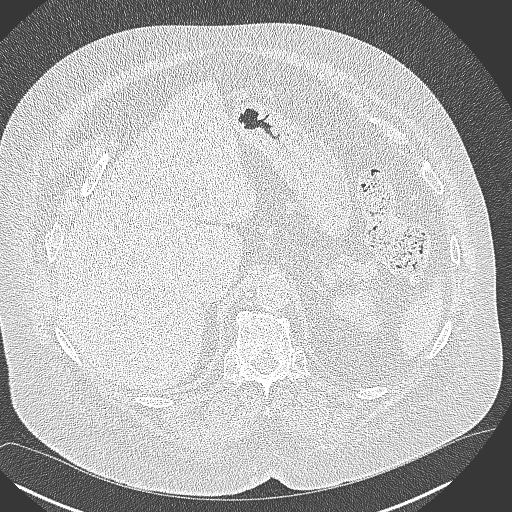
[im 83/330  lung]
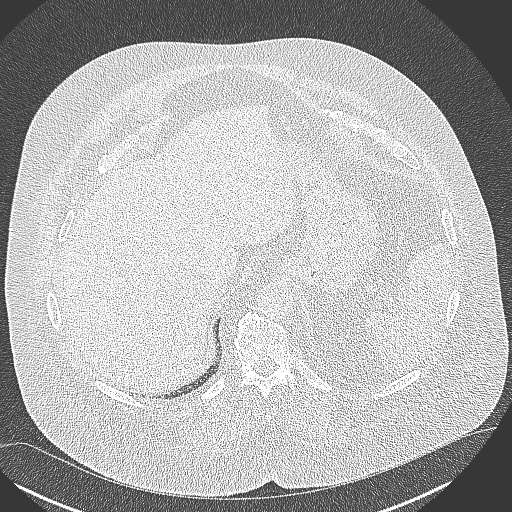
[im 110/330  lung]
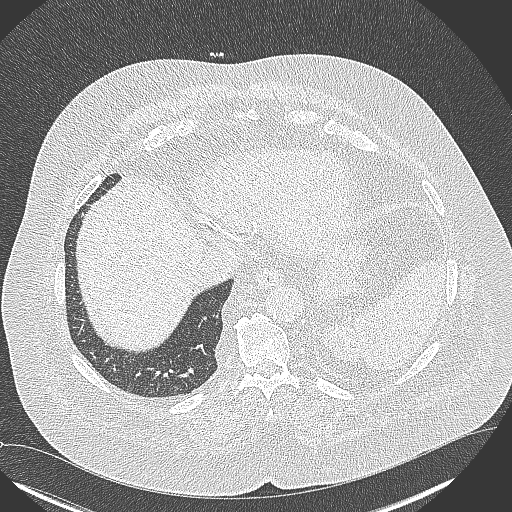
[im 138/330  mediastinal]
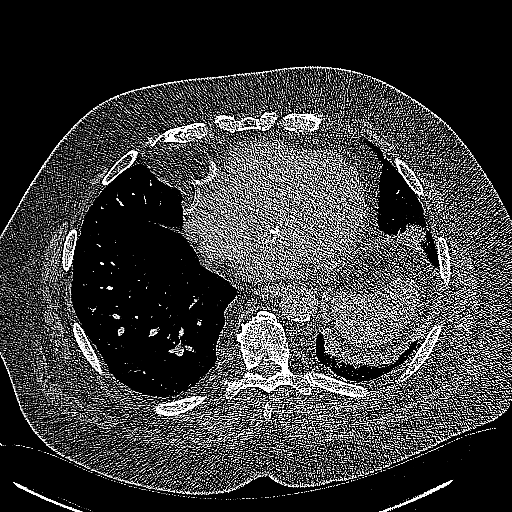
[im 138/330  lung]
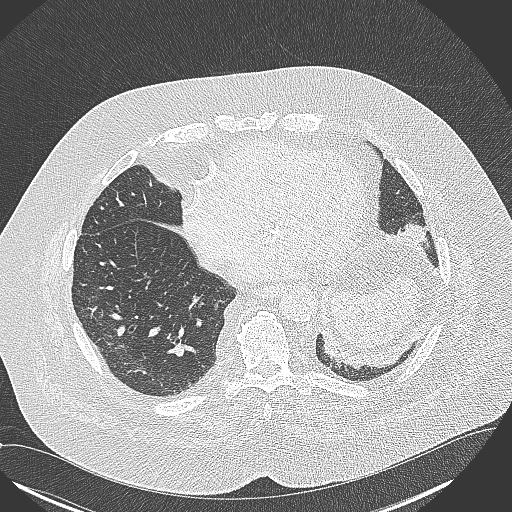
[im 165/330  lung]
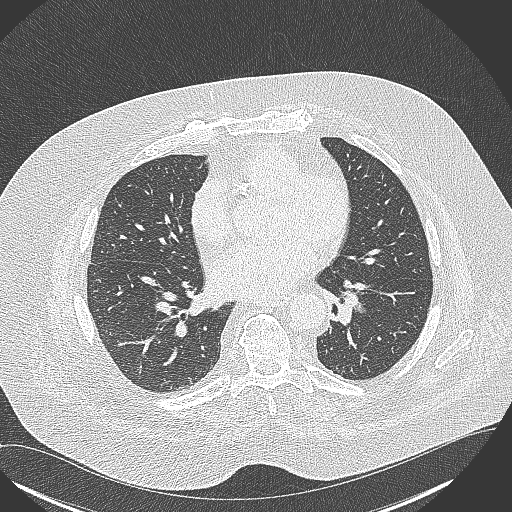
[im 192/330  lung]
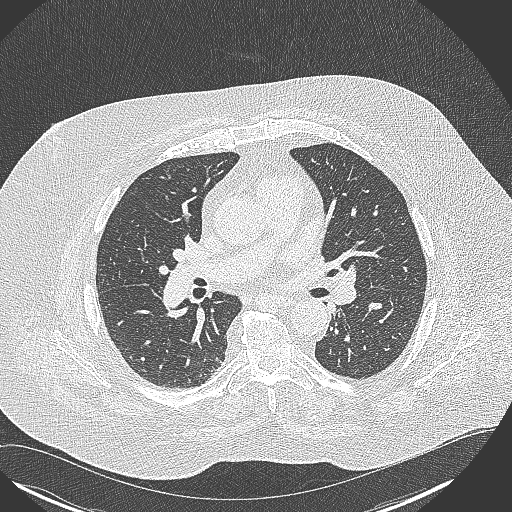
[im 220/330  lung]
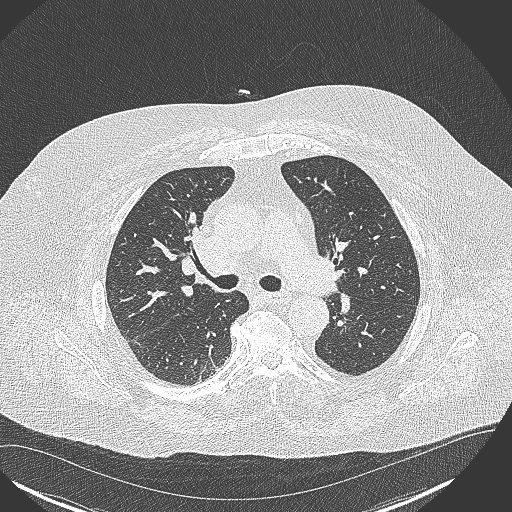
[im 247/330  mediastinal]
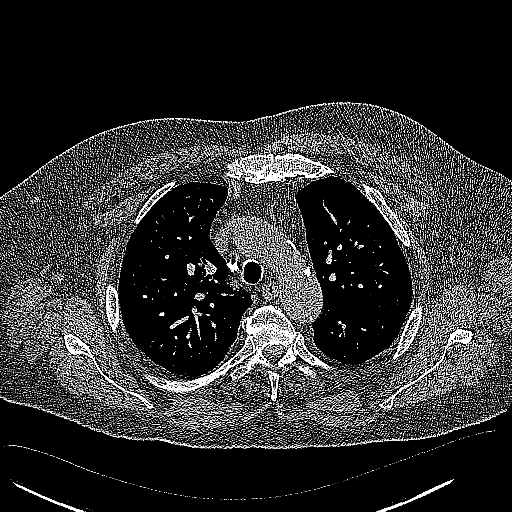
[im 247/330  lung]
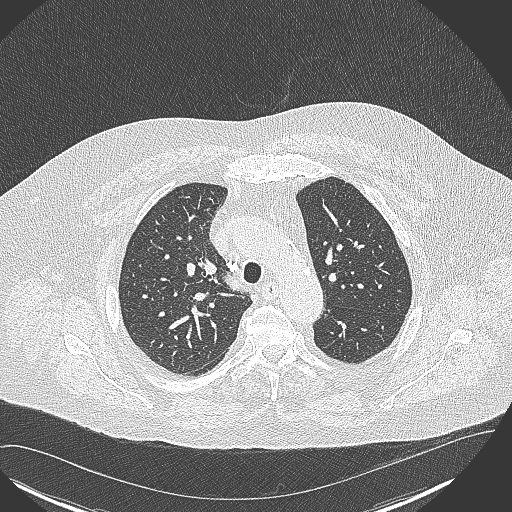
[im 275/330  lung]
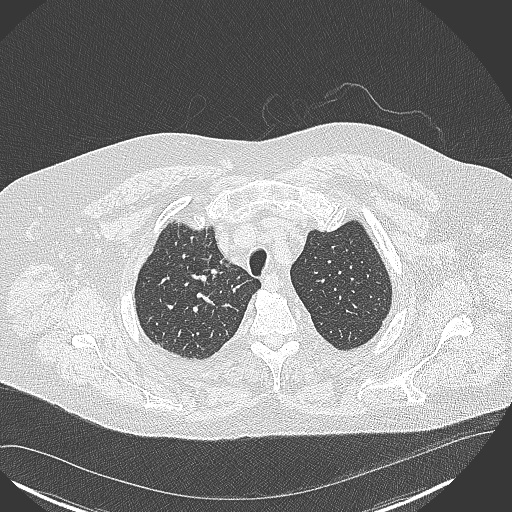
[im 302/330  lung]
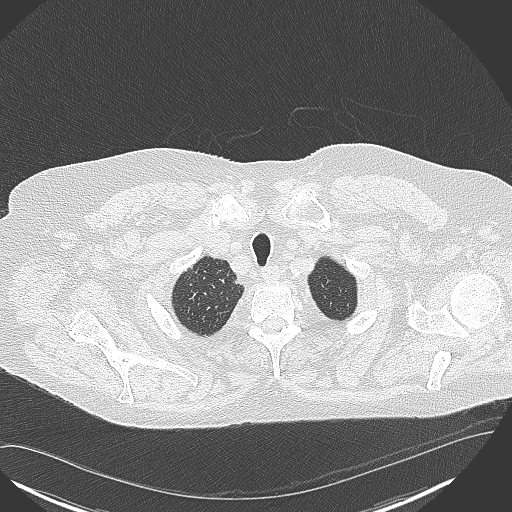

[Series 7: coronal · coronal · 0.67mm/px · 3 of 118 slices shown]
[im 24/118  lung]
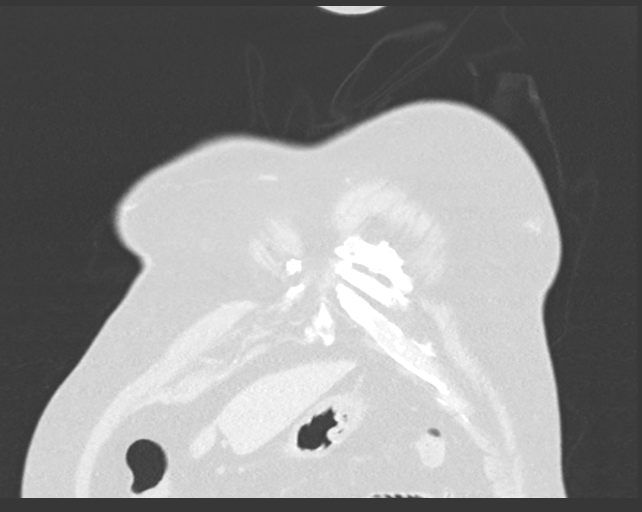
[im 47/118  lung]
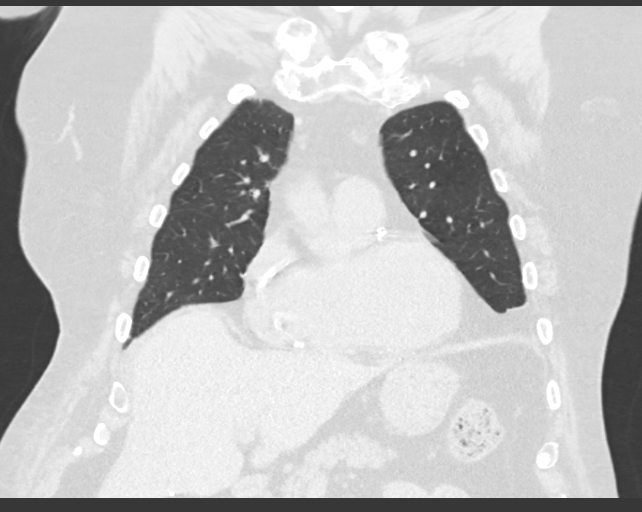
[im 71/118  lung]
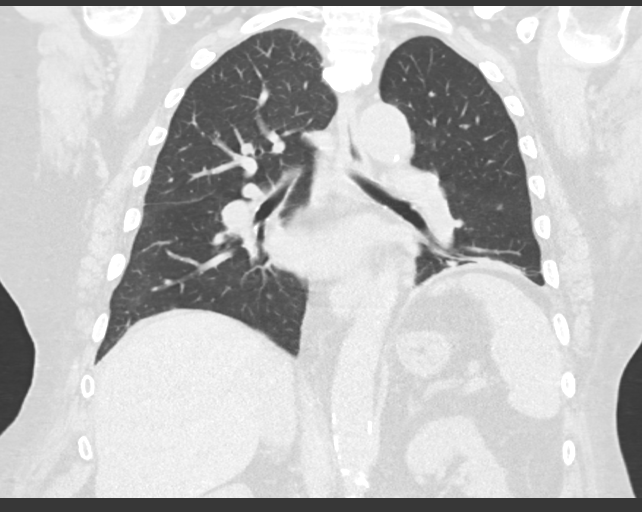

[14 of 36 positions shown; findings below may reference images not displayed]

FINDINGS: Cardiovascular: Heart size is normal. There is no significant
pericardial fluid, thickening or pericardial calcification. There is
aortic atherosclerosis, as well as atherosclerosis of the great
vessels of the mediastinum and the coronary arteries, including
calcified atherosclerotic plaque in the left main, left anterior
descending and right coronary arteries.

Mediastinum/Nodes: No pathologically enlarged mediastinal or hilar
lymph nodes. Please note that accurate exclusion of hilar adenopathy
is limited on noncontrast CT scans. Esophagus is unremarkable in
appearance. No axillary lymphadenopathy.

Lungs/Pleura: High-resolution images demonstrates some very mild
ground-glass attenuation and septal thickening, most evident in the
periphery of the right lower lobe. No parenchymal banding, traction
bronchiectasis or frank honeycombing. Inspiratory and expiratory
imaging demonstrates some mild air trapping indicative of small
airways disease. Chronic elevation of the left hemidiaphragm again
noted. No acute consolidative airspace disease. No pleural
effusions. No suspicious appearing pulmonary nodules or masses are
noted.

Upper Abdomen: Aortic atherosclerosis. Status post cholecystectomy.
Splenule incidentally noted adjacent to the medial aspect of the
upper spleen.

Musculoskeletal: There are no aggressive appearing lytic or blastic
lesions noted in the visualized portions of the skeleton.
IMPRESSION: 1. There are very subtle changes in the lungs, most evident in the
right lower lobe, which could be indicative of early or mild
interstitial lung disease. This is stable compared to the prior
study, and categorized as indeterminate for usual interstitial
pneumonia (UIP) per current ATS guidelines. Given the asymmetric
findings, this is most likely represents some mild post infectious
or inflammatory fibrosis.
2. Aortic atherosclerosis, in addition to left main and 2 vessel
coronary artery disease. Assessment for potential risk factor
modification, dietary therapy or pharmacologic therapy may be
warranted, if clinically indicated.

Aortic Atherosclerosis ([id number]).
